# Patient Record
Sex: Male | Born: 1955 | Race: White | Hispanic: No | Smoking: Former smoker
Health system: Southern US, Community
[De-identification: ages and names within clinical notes are randomized; demographics above are authoritative.]

## PROBLEM LIST (undated history)

## (undated) DIAGNOSIS — I2510 Atherosclerotic heart disease of native coronary artery without angina pectoris: Secondary | ICD-10-CM

## (undated) DIAGNOSIS — I420 Dilated cardiomyopathy: Secondary | ICD-10-CM

## (undated) DIAGNOSIS — Z8739 Personal history of other diseases of the musculoskeletal system and connective tissue: Secondary | ICD-10-CM

## (undated) DIAGNOSIS — Z87891 Personal history of nicotine dependence: Secondary | ICD-10-CM

## (undated) DIAGNOSIS — L719 Rosacea, unspecified: Secondary | ICD-10-CM

## (undated) DIAGNOSIS — I639 Cerebral infarction, unspecified: Secondary | ICD-10-CM

## (undated) DIAGNOSIS — I10 Essential (primary) hypertension: Secondary | ICD-10-CM

## (undated) DIAGNOSIS — L930 Discoid lupus erythematosus: Secondary | ICD-10-CM

## (undated) DIAGNOSIS — K760 Fatty (change of) liver, not elsewhere classified: Secondary | ICD-10-CM

## (undated) DIAGNOSIS — E119 Type 2 diabetes mellitus without complications: Secondary | ICD-10-CM

## (undated) DIAGNOSIS — E785 Hyperlipidemia, unspecified: Secondary | ICD-10-CM

## (undated) DIAGNOSIS — M109 Gout, unspecified: Secondary | ICD-10-CM

## (undated) HISTORY — DX: Hyperlipidemia, unspecified: E78.5

## (undated) HISTORY — DX: Gout, unspecified: M10.9

## (undated) HISTORY — DX: Personal history of nicotine dependence: Z87.891

## (undated) HISTORY — DX: Dilated cardiomyopathy: I42.0

## (undated) HISTORY — DX: Type 2 diabetes mellitus without complications: E11.9

## (undated) HISTORY — DX: Fatty (change of) liver, not elsewhere classified: K76.0

## (undated) HISTORY — DX: Rosacea, unspecified: L71.9

## (undated) HISTORY — DX: Cerebral infarction, unspecified: I63.9

## (undated) HISTORY — DX: Discoid lupus erythematosus: L93.0

## (undated) SURGERY — LEFT HEART CATH
Anesthesia: Choice

---

## 2001-07-25 DIAGNOSIS — M109 Gout, unspecified: Secondary | ICD-10-CM

## 2001-07-25 HISTORY — DX: Gout, unspecified: M10.9

## 2004-03-30 ENCOUNTER — Ambulatory Visit: Payer: Self-pay | Admitting: Family Medicine

## 2004-09-25 ENCOUNTER — Ambulatory Visit: Payer: Self-pay | Admitting: Family Medicine

## 2004-10-19 ENCOUNTER — Ambulatory Visit: Payer: Self-pay | Admitting: Internal Medicine

## 2006-01-23 ENCOUNTER — Ambulatory Visit: Payer: Self-pay | Admitting: Family Medicine

## 2007-06-03 ENCOUNTER — Ambulatory Visit: Payer: Self-pay | Admitting: Family Medicine

## 2007-06-03 DIAGNOSIS — L930 Discoid lupus erythematosus: Secondary | ICD-10-CM | POA: Insufficient documentation

## 2007-06-03 DIAGNOSIS — N453 Epididymo-orchitis: Secondary | ICD-10-CM | POA: Insufficient documentation

## 2007-06-03 DIAGNOSIS — M109 Gout, unspecified: Secondary | ICD-10-CM | POA: Insufficient documentation

## 2007-06-03 LAB — CONVERTED CEMR LAB
Basophils Relative: 0.1 % (ref 0.0–1.0)
Hemoglobin: 15.4 g/dL (ref 13.0–17.0)
Monocytes Relative: 5.8 % (ref 3.0–11.0)
Platelets: 113 10*3/uL — ABNORMAL LOW (ref 150–400)
RDW: 13.1 % (ref 11.5–14.6)
WBC: 9 10*3/uL (ref 4.5–10.5)

## 2007-06-04 LAB — CONVERTED CEMR LAB
Chlamydia, DNA Probe: NEGATIVE
GC Probe Amp, Genital: NEGATIVE

## 2007-06-24 ENCOUNTER — Ambulatory Visit: Payer: Self-pay | Admitting: Family Medicine

## 2007-07-16 ENCOUNTER — Ambulatory Visit: Payer: Self-pay | Admitting: Family Medicine

## 2007-07-16 DIAGNOSIS — F528 Other sexual dysfunction not due to a substance or known physiological condition: Secondary | ICD-10-CM | POA: Insufficient documentation

## 2007-07-31 DIAGNOSIS — IMO0002 Reserved for concepts with insufficient information to code with codable children: Secondary | ICD-10-CM | POA: Insufficient documentation

## 2007-08-06 ENCOUNTER — Encounter: Payer: Self-pay | Admitting: Family Medicine

## 2008-05-12 ENCOUNTER — Ambulatory Visit: Payer: Self-pay | Admitting: Family Medicine

## 2008-05-12 DIAGNOSIS — R5381 Other malaise: Secondary | ICD-10-CM | POA: Insufficient documentation

## 2008-05-12 DIAGNOSIS — R5383 Other fatigue: Secondary | ICD-10-CM

## 2008-05-16 LAB — CONVERTED CEMR LAB
Albumin: 4.6 g/dL (ref 3.5–5.2)
Alkaline Phosphatase: 60 units/L (ref 39–117)
BUN: 17 mg/dL (ref 6–23)
Cholesterol: 167 mg/dL (ref 0–200)
Eosinophils Absolute: 0.4 10*3/uL (ref 0.0–0.7)
Eosinophils Relative: 5 % (ref 0.0–5.0)
GFR calc Af Amer: 90 mL/min
GFR calc non Af Amer: 74 mL/min
HCT: 45.5 % (ref 39.0–52.0)
MCHC: 34.5 g/dL (ref 30.0–36.0)
MCV: 89.5 fL (ref 78.0–100.0)
Monocytes Absolute: 0.6 10*3/uL (ref 0.1–1.0)
PSA: 0.67 ng/mL (ref 0.10–4.00)
Platelets: 130 10*3/uL — ABNORMAL LOW (ref 150–400)
Potassium: 3.9 meq/L (ref 3.5–5.1)
RDW: 12.9 % (ref 11.5–14.6)
Sodium: 140 meq/L (ref 135–145)
Testosterone: 507.69 ng/dL (ref 350.00–890)
Triglycerides: 244 mg/dL (ref 0–149)

## 2008-06-29 ENCOUNTER — Encounter: Payer: Self-pay | Admitting: Family Medicine

## 2008-08-16 HISTORY — PX: OTHER SURGICAL HISTORY: SHX169

## 2008-09-06 ENCOUNTER — Ambulatory Visit: Payer: Self-pay | Admitting: Family Medicine

## 2008-09-06 DIAGNOSIS — B009 Herpesviral infection, unspecified: Secondary | ICD-10-CM | POA: Insufficient documentation

## 2008-09-06 DIAGNOSIS — L2089 Other atopic dermatitis: Secondary | ICD-10-CM | POA: Insufficient documentation

## 2008-09-06 DIAGNOSIS — M25519 Pain in unspecified shoulder: Secondary | ICD-10-CM | POA: Insufficient documentation

## 2008-09-06 DIAGNOSIS — L259 Unspecified contact dermatitis, unspecified cause: Secondary | ICD-10-CM | POA: Insufficient documentation

## 2008-09-16 ENCOUNTER — Ambulatory Visit: Payer: Self-pay | Admitting: Family Medicine

## 2008-09-16 LAB — CONVERTED CEMR LAB: Rapid Strep: NEGATIVE

## 2010-01-01 ENCOUNTER — Encounter (INDEPENDENT_AMBULATORY_CARE_PROVIDER_SITE_OTHER): Payer: Self-pay | Admitting: *Deleted

## 2010-01-06 ENCOUNTER — Ambulatory Visit: Payer: Self-pay | Admitting: Family Medicine

## 2010-01-06 DIAGNOSIS — J029 Acute pharyngitis, unspecified: Secondary | ICD-10-CM | POA: Insufficient documentation

## 2010-05-29 ENCOUNTER — Emergency Department: Payer: Self-pay | Admitting: Unknown Physician Specialty

## 2010-06-27 NOTE — Letter (Signed)
Summary: Nadara Eaton letter  [place] [facility]  [address] [place], [place] [address]   Phone: [phone]  Fax: [phone]       01/01/2010 MRN: [id number]  WANDELL SCULLION [address] [place], [place]  [address]  Dear Mr. BURESH,  [facility] [place], and [facility] announce the retirement of Arta Silence, M.D., from full-time practice at the [facility] office effective November 23, 2009 and his plans of returning part-time.  It is important to Dr. Hetty Ely and to our practice that you understand that [facility] has seven physicians in our office for your health care needs.  We will continue to offer the same exceptional care that you have today.    Dr. Hetty Ely has spoken to many of you about his plans for retirement and returning part-time in the fall.   We will continue to work with you through the transition to schedule appointments for you in the office and meet the high standards that [facility] is committed to.   Again, it is with great pleasure that we share the news that Dr. Hetty Ely will return to [facility] in October of 2011 with a reduced schedule.    If you have any questions, or would like to request an appointment with one of our physicians, please call us at [phone] and press the option for Scheduling an appointment.  We take pleasure in providing you with excellent patient care and look forward to seeing you at your next office visit.  Our [facility] Physicians are:  Tillman Abide, M.D. Laurita Quint, M.D. Roxy Manns, M.D. Kerby Nora, M.D. Hannah Beat, M.D. Ruthe Mannan, M.D. We proudly welcomed Raechel Ache, M.D. and Eustaquio Boyden, M.D. to the practice in July/August 2011.  Sincerely,  [facility]

## 2010-06-27 NOTE — Assessment & Plan Note (Signed)
Summary: PINK EYE   Vital Signs:  Patient profile:   55 year old male Weight:      267 pounds Temp:     97.8 degrees F Pulse rate:   76 / minute BP sitting:   148 / 90  (left arm)  Vitals Entered By: Lamar Sprinkles, CMA (January 06, 2010 9:38 AM) CC: Sore throat x 1 mth/ dx strep positive at UC in [place]. Has been on abx x 4. Req refills of pain meds also.    History of Present Illness: 1 month ago had a very bad sore throat -- severe and very red , with swollen glands and fever and some white papules in his throat  felt tired and sick in general  went to urgent care and was dx with strep  rapid strep pos - instantly  treated with amoxicilln  then went back 1 week later -- no improvement and low grade fever  did throat cx and that was neg for strep or other infection  then treated with zithromax-- was better for 3 days and then came back then put him on augmentin -- has been on that with aleve (and very tired )  started to improve a bit yesterday  throat is sore but not nearly like it was  still has 2 days left of the abx   fevers are gone  no diffuse rash some bumps on fingers -he has had  ears are a little itchy  no allergies that he knows of  no nasal symptoms   no red or swollen joints no tick bites   airline lost his meds -- colchicine and indocin - for gout as needed/ does not use often   smoked in the past- no more   has been very stressed at work lately - not taking care of himself  is a bit shaky and nervous   in past has had a cold sore  has had sore throats in the past   Current Medications (verified): 1)  Endocet 5-325 Mg Tabs (Oxycodone-Acetaminophen) .[name].. 1 or 2 Tablets As Needed Pain 2)  Valtrex 1 Gm Tabs (Valacyclovir Hcl) .... 2 Tabs By Mouth Twice A Day For One Day. 3)  Vicodin 5-500 Mg Tabs (Hydrocodone-Acetaminophen) .... One Tab By Mouth Every 6 Hrs As Needed  Allergies (verified): 1)  ! * Solumedrol//cortisone  Review of  Systems General:  Complains of fatigue; denies loss of appetite, malaise, and sleep disorder. Eyes:  Denies blurring and eye irritation. ENT:  Complains of sore throat; denies ear discharge, earache, and nasal congestion. CV:  Denies chest pain or discomfort, lightheadness, and palpitations. Resp:  Denies cough and wheezing. GI:  Denies abdominal pain, change in bowel habits, and indigestion. MS:  Denies joint pain, joint redness, and joint swelling; no gout lately . Derm:  Denies itching, lesion(s), poor wound healing, and rash. Psych:  is very stressed. Heme:  Denies abnormal bruising, bleeding, and enlarge lymph nodes.  Physical Exam  General:  overweight but generally well appearing  Head:  normocephalic, atraumatic, and no abnormalities observed.  no snus tenderness Eyes:  vision grossly intact, pupils equal, pupils round, pupils reactive to light, and no injection.   Ears:  R ear normal and L ear normal.   Nose:  no nasal discharge.   Mouth:  very slt injection of throat posteriorly no exudate or swelling or lesions Neck:  No deformities, masses, or tenderness noted. Lungs:  Normal respiratory effort, chest expands symmetrically. Lungs are clear  to auscultation, no crackles or wheezes. Heart:  Normal rate and regular rhythm. S1 and S2 normal without gallop, murmur, click, rub or other extra sounds. Abdomen:  soft, non-tender, no hepatomegaly, and no splenomegaly.   Msk:  no acute joint changes  Extremities:  No clubbing, cyanosis, edema, or deformity noted with normal full range of motion of all joints.   Skin:  dry skin on hands and ears no rash Cervical Nodes:  No lymphadenopathy noted Psych:  normal affect, talkative and pleasant    Impression & Recommendations:  Problem # 1:  SORE THROAT (ICD-462) Assessment Deteriorated with prev dx strep now finally responding to augmentin will continue 5 more days of this if not further imp by mid week will call for ENT ref  His  updated medication list for this problem includes:    Augmentin 875-125 Mg Tabs (Amoxicillin-pot clavulanate) .[name]... 1 by mouth two times a day for 5 days    Indomethacin 50 Mg Caps (Indomethacin) .[name]... 1 by mouth up to three times a day as needed gout- take with food  Problem # 2:  GOUT (ICD-274.9) Assessment: Comment Only pt needs refils of gout medicine - incl indometh/ colcrys and also vidocin for as needed while traveling  does not refil these often His updated medication list for this problem includes:    Indomethacin 50 Mg Caps (Indomethacin) .[name]... 1 by mouth up to three times a day as needed gout- take with food    Colcrys 0.6 Mg Tabs (Colchicine) .[name]... 1 by mouth up to three times a day as needed gout with food  Complete Medication List: 1)  Valtrex 1 Gm Tabs (Valacyclovir hcl) .... 2 tabs by mouth twice a day for one day. 2)  Vicodin 5-500 Mg Tabs (Hydrocodone-acetaminophen) .... One tab by mouth every 6 hrs as needed 3)  Augmentin 875-125 Mg Tabs (Amoxicillin-pot clavulanate) .[name].. 1 by mouth two times a day for 5 days 4)  Indomethacin 50 Mg Caps (Indomethacin) .[name].. 1 by mouth up to three times a day as needed gout- take with food 5)  Colcrys 0.6 Mg Tabs (Colchicine) .[name].. 1 by mouth up to three times a day as needed gout with food  Patient Instructions: 1)  let us know mid next week if your sore throat is not better  2)  drink lots of water  3)  finish 5 more days of the augmentin 4)  here are refils for gout medicine as needed  Prescriptions: COLCRYS 0.6 MG TABS (COLCHICINE) 1 by mouth up to three times a day as needed gout with food  #20 x 0   Entered and Authorized by:   Judith Part MD   Signed by:   Judith Part MD on 01/06/2010   Method used:   Print then Give to Patient   RxID:   [id number] VICODIN 5-500 MG TABS (HYDROCODONE-ACETAMINOPHEN) one tab by mouth every 6 hrs as needed  #20 x 0   Entered and Authorized by:   Judith Part MD   Signed by:   Judith Part MD on 01/06/2010   Method used:   Print then Give to Patient   RxID:   [id number] INDOMETHACIN 50 MG CAPS (INDOMETHACIN) 1 by mouth up to three times a day as needed gout- take with food  #30 x 0   Entered and Authorized by:   Judith Part MD   Signed by:   Judith Part MD on 01/06/2010   Method  used:   Print then Give to Patient   RxID:   [id number] AUGMENTIN 875-125 MG TABS (AMOXICILLIN-POT CLAVULANATE) 1 by mouth two times a day for 5 days  #10 x 0   Entered and Authorized by:   Judith Part MD   Signed by:   Judith Part MD on 01/06/2010   Method used:   Print then Give to Patient   RxID:   [id number]

## 2010-10-12 NOTE — Assessment & Plan Note (Signed)
[facility]                                 ON-CALL NOTE   Colton [name], Colton [name]                      MRN:          [id number]  DATE:07/19/2006                            DOB:          1955-10-12    Phone number:  [phone].   [name] states that he has bronchitis, that he usually just gets  Biaxin and requests that he get a phone in prescription.  Apparently he  is going to [place] for the weekend and cannot come in to the office.   PLAN:  He was told that he would need to be seen before any antibiotics  would be given and apparently he was not too happy about that.  I gave  him the message and he was not pleased, but this is definitely our rule  and we explained that.     Karie Schwalbe, MD  Electronically Signed    RIL/MedQ  DD: 07/19/2006  DT: 07/19/2006  Job #: [id number]   cc:   Arta Silence, MD

## 2010-11-17 ENCOUNTER — Emergency Department (HOSPITAL_COMMUNITY): Payer: Self-pay

## 2010-11-17 ENCOUNTER — Inpatient Hospital Stay (HOSPITAL_COMMUNITY)
Admission: EM | Admit: 2010-11-17 | Discharge: 2010-11-18 | DRG: 202 | Disposition: A | Discharge status: Home / Self Care | Payer: Self-pay | Attending: Internal Medicine | Admitting: Internal Medicine

## 2010-11-17 DIAGNOSIS — I498 Other specified cardiac arrhythmias: Secondary | ICD-10-CM | POA: Diagnosis present

## 2010-11-17 DIAGNOSIS — R030 Elevated blood-pressure reading, without diagnosis of hypertension: Secondary | ICD-10-CM | POA: Diagnosis present

## 2010-11-17 DIAGNOSIS — F17200 Nicotine dependence, unspecified, uncomplicated: Secondary | ICD-10-CM | POA: Diagnosis present

## 2010-11-17 DIAGNOSIS — R599 Enlarged lymph nodes, unspecified: Secondary | ICD-10-CM | POA: Diagnosis present

## 2010-11-17 DIAGNOSIS — J209 Acute bronchitis, unspecified: Principal | ICD-10-CM | POA: Diagnosis present

## 2010-11-17 DIAGNOSIS — F411 Generalized anxiety disorder: Secondary | ICD-10-CM | POA: Diagnosis present

## 2010-11-17 DIAGNOSIS — J189 Pneumonia, unspecified organism: Secondary | ICD-10-CM | POA: Diagnosis present

## 2010-11-17 LAB — PROTIME-INR
INR: 1.04 (ref 0.00–1.49)
Prothrombin Time: 13.8 seconds (ref 11.6–15.2)

## 2010-11-17 LAB — DIFFERENTIAL
Eosinophils Absolute: 0.6 10*3/uL (ref 0.0–0.7)
Eosinophils Relative: 4 % (ref 0–5)
Lymphs Abs: 2.1 10*3/uL (ref 0.7–4.0)
Monocytes Absolute: 1 10*3/uL (ref 0.1–1.0)
Monocytes Relative: 7 % (ref 3–12)

## 2010-11-17 LAB — CBC
MCH: 31.1 pg (ref 26.0–34.0)
Platelets: 120 10*3/uL — ABNORMAL LOW (ref 150–400)
RBC: 5.05 MIL/uL (ref 4.22–5.81)
WBC: 14.2 10*3/uL — ABNORMAL HIGH (ref 4.0–10.5)

## 2010-11-17 LAB — COMPREHENSIVE METABOLIC PANEL
ALT: 12 U/L (ref 0–53)
AST: 16 U/L (ref 0–37)
CO2: 25 mEq/L (ref 19–32)
Chloride: 101 mEq/L (ref 96–112)
Creatinine, Ser: 1.13 mg/dL (ref 0.50–1.35)
GFR calc non Af Amer: >60 mL/min (ref >60)
Glucose, Bld: 108 mg/dL — ABNORMAL HIGH (ref 70–99)
Total Bilirubin: 0.3 mg/dL (ref 0.3–1.2)

## 2010-11-17 LAB — TROPONIN I: Troponin I: <0.3 ng/mL (ref <0.30)

## 2010-11-17 LAB — URINALYSIS, ROUTINE W REFLEX MICROSCOPIC
Glucose, UA: NEGATIVE mg/dL
Hgb urine dipstick: NEGATIVE
Ketones, ur: NEGATIVE mg/dL
Leukocytes, UA: NEGATIVE
Protein, ur: NEGATIVE mg/dL

## 2010-11-17 LAB — PRO B NATRIURETIC PEPTIDE: Pro B Natriuretic peptide (BNP): 157.7 pg/mL — ABNORMAL HIGH (ref 0–125)

## 2010-11-18 ENCOUNTER — Observation Stay (HOSPITAL_COMMUNITY): Payer: Self-pay

## 2010-11-18 LAB — HEPARIN LEVEL (UNFRACTIONATED): Heparin Unfractionated: <0.1 IU/mL — ABNORMAL LOW (ref 0.30–0.70)

## 2010-11-18 LAB — CARDIAC PANEL(CRET KIN+CKTOT+MB+TROPI)
CK, MB: 1.1 ng/mL (ref 0.3–4.0)
Relative Index: INVALID (ref 0.0–2.5)
Troponin I: <0.3 ng/mL (ref <0.30)

## 2010-11-18 LAB — TSH: TSH: 1.096 u[IU]/mL (ref 0.350–4.500)

## 2010-11-20 NOTE — Consult Note (Signed)
NAMETRACER, GUTRIDGE NO.:  [id number]  MEDICAL RECORD NO.:  [id number]  LOCATION:  2014                         FACILITY:  [facility]  PHYSICIAN:  Armanda Magic, M.D.     DATE OF BIRTH:  12-06-1955  DATE OF CONSULTATION:  11/18/2010 DATE OF DISCHARGE:                                CONSULTATION   PRIMARY CARDIOLOGIST:  Jesse Sans. Daleen Squibb, MD, [facility]  REFERRING PHYSICIAN:  Dr. Ignacia Palma.  CHIEF COMPLAINT:  Chest pain.  HISTORY OF PRESENT ILLNESS:  This is a 55 year old male with a history of stress test in the past who presents to the emergency room with complaints of chest pain.  He was in his usual state of health until Friday evening around 9 p.m. when he developed a dull pain in the midsternal region of his chest radiating into the epigastric and down his left arm.  He also had a burning that started in his throat and with the whole way down into the epigastrium.  This has been constant since Friday and has not stopped or abated at all in intensity.  The pain has increased with deep inspiration.  He has had a fever and subjective chills for several days with mild phlegm production and brown-colored sputum.  The patient also says that when he is up and moving the pain gets worse as well.  He denies any nausea.  In the emergency room, initially he is tachycardic with a blood pressure initially of 143/101. The pain is noted to be a 5/10 and has been that way for the past 24 hours with no improvement.  He denies any back pain.  PAST MEDICAL HISTORY:  Gout.  There is no history of hypertension, diabetes, or CAD.  ALLERGIES:  None.  MEDICATIONS:  Colchicine as needed.  SOCIAL HISTORY:  He smokes 2 packs of cigarettes daily for the past 35 years.  He occasionally drinks alcohol.  He is widowed.  He has no children.  FAMILY HISTORY:  His mother is alive and well.  His father died of old age and significant coronary artery disease.  He has 3 brothers and 1 sister  who are alive and well.  REVIEW OF SYSTEMS:  Otherwise as stated in the HPI is negative.  PHYSICAL EXAMINATION:  VITAL SIGNS:  Blood pressure is 159/99 mmHg, heart rate 118 beats per minute, T-max 99.1, and O2 saturations 95% on 2 liters. GENERAL:  A well-developed, well-nourished white male in no acute distress. HEENT:  Benign. NECK:  Supple without lymphadenopathy. LUNGS:  Clear to auscultation except for crackles at the left base. HEART:  Regular rate and rhythm.  No murmurs, rubs, or gallops. ABDOMEN:  Soft, nontender, and nondistended.  Normoactive bowel sounds. No hepatosplenomegaly. EXTREMITIES:  No cyanosis, erythema, or edema.  +2 dorsalis pedis pulses bilaterally.  LABORATORY DATA:  Sodium 137, potassium 4.1, chloride 101, bicarb 25, BUN 16, and creatinine 1.13.  BNP 157.  White cell count 14.2, hemoglobin 15.7, hematocrit 44.8, and platelet count 120.  Troponin less than 0.3.  CPK 40, MB 1.2.  EKG shows sinus rhythm to sinus tachycardia with incomplete right bundle-branch block.  Chest x-ray shows left bibasilar atelectasis.  ASSESSMENT: 1. Atypical  chest pain, both pleuritic and burning, constant x24 hours     with normal cardiac enzymes.  He has subjective fever and chills     and has a low-grade fever tonight with cough productive of brown     sputum, most consistent with acute bronchitis with elevated white     blood cell count.  EKG is nonischemic, but just J-point elevation     in V2.  He also has an incomplete right bundle-branch block. 2. Elevated blood pressure, questionably secondary to pain and     underlying anxiety.  He has been under a lot of stress at work. 3. Shortness of breath with fever, chills, and cough and elevated     white blood cell count consistent with upper respiratory infection. 4. Sinus tachycardia, questionably secondary to fever and underlying     lung infection.  PLAN:  Admit to the Telemetry per Hospitalist.  We will cycle  cardiac enzymes.  Continue IV heparin drip until rule out is complete.  GI cocktail for burning pain.  Blood pressure management per Hospitalist. We will check a stat D-dimer.  Treatment of bronchitis per Hospitalist.     Armanda Magic, M.D.     TT/MEDQ  D:  11/18/2010  T:  11/18/2010  Job:  [id number]  cc:   Thomas C. Daleen Squibb, MD, [facility]  Electronically Signed by Armanda Magic M.D. on 11/20/2010 10:03:43 PM

## 2010-12-11 NOTE — Discharge Summary (Signed)
  NAME:  Colton [name], Colton [name] NO.:  [id number]  MEDICAL RECORD NO.:  [id number]  LOCATION:  2014                         FACILITY:  [facility]  PHYSICIAN:  Zannie Cove, MD     DATE OF BIRTH:  1955/09/30  DATE OF ADMISSION:  11/17/2010 DATE OF DISCHARGE:                              DISCHARGE SUMMARY   PRIMARY CARE PHYSICIAN:  Arta Silence, MD  CARDIOLOGIST:  Jesse Sans. Wall, MD, [facility]  DISCHARGE DIAGNOSES: 1. Bronchitis. 2. Questionable early pneumonia. 3. Reactive borderline enlarged mediastinal lymphadenopathy. 4. Tobacco use. 5. Pleuritic chest pain, improved. 6. History of gout. 7. Obesity. 8. CT findings suspicious for emphysema, needs PFTs to confirm COPD.  DISCHARGE MEDICATIONS: 1. Moxifloxacin 400 mg p.o. daily for nine more days. 2. Aspirin 81 mg daily. 3. Albuterol MDI 1-2 puffs q.4 h. p.r.n. 4. Tylenol 650 mg q.6 h. p.r.n.  CONSULTANTS: 1. Armanda Magic, M.D. covering for [facility] 2. Vesta Mixer, M.D., [facility].  DIAGNOSTICS/INVESTIGATIONS:  Chest x-ray November 17, 2010 low lung volumes, bibasilar atelectasis.  CT angio of the chest November 18, 2010, no evidence of acute PE, emphysema with dependent collapse or consolidation, borderline enlarged lymph nodes in the mediastinum could be reactive, but neoplastic involvement could not be excluded.  Consider followup CT in 3 months to assess debility.  HOSPITAL COURSE:  [name] is a 55 year old gentleman with history of tobacco use for many years presented to the hospital with pleuritic chest pain, fevers, and chills.  On evaluation, he was felt to have bronchitis versus early pneumonia as well as admitted to the hospital to rule out PE as well as MI.  His EKG was found to be unremarkable.  His cardiac markers were negative, seen by Cardiology in consultation as well who agreed that it was our respiratory pulmonic process.  He was treated with Rocephin, Zithromax as well  as inhalers with moderate clinical response.  However, his D-dimers was found to be a mildly elevated on admission, subsequently underwent a CT angiogram with results dictated above.  The patient has been adamant to leave and hence will be discharged home in stable condition today to complete his course of antibiotics, use inhalers.  He does have history of reactions to prednisone as well as different derivatives of steroids, hence did not receive this in the hospital.  He will need PFTs as outpatient as well as a follow-up CT of his chest to make sure his mediastinal lymph nodes are stable or improving.  DISCHARGE CONDITION:  Stable.  VITAL SIGNS:  Temperature is 98.1, pulse 99, blood pressure 154/95, respirations 20, satting 92% on room air.  DISCHARGE FOLLOWUP:  Arta Silence, MD in 1 week.     Zannie Cove, MD     PJ/MEDQ  D:  11/18/2010  T:  11/18/2010  Job:  [id number]  cc:   Arta Silence, MD Jesse Sans. Daleen Squibb, MD, [facility]  Electronically Signed by Zannie Cove  on 12/11/2010 08:14:05 PM

## 2010-12-17 NOTE — H&P (Signed)
NAME:  Colton [name], SCHREUR NO.:  [id number]  MEDICAL RECORD NO.:  [id number]  LOCATION:                                 FACILITY:  PHYSICIAN:  Della Goo, M.D. DATE OF BIRTH:  Jul 21, 1955  DATE OF ADMISSION:  11/18/2010 DATE OF DISCHARGE:                             HISTORY & PHYSICAL   PRIMARY CARE PHYSICIAN AND CARDIOLOGIST:  Maisie Fus C. Wall, MD, [facility].  CHIEF COMPLAINT:  Chest pain, fever.  HISTORY OF PRESENT ILLNESS:  This is a 55 year old male, who presents to the emergency department with complaints of constant substernal area chest pain for the past one and a half days.  He reports having fever and chills and also states the pain is worse with deep breaths.  He denies having any cough.  He does report having some mild soreness of the throat.  The patient was seen in the emergency department, was evaluated by cardiology Dr. Eliott Nine.  He also had an ER workup in which his EKG had normal findings.  Chest x-ray did reveal bibasilar atelectasis.  The patient's symptoms were not relieved with nitroglycerin therapy that have been administered.  The patient was started on empiric therapy for a possible tracheal bronchitis versus early pneumonia.  PAST MEDICAL HISTORY:  Significant for gout.  PAST SURGICAL HISTORY:  None.  MEDICATIONS:  None.  ALLERGIES:  PREDNISONE.  SOCIAL HISTORY:  The patient is a trader.  He is a smoker.  He smokes two pack of cigarettes daily.  He denies any alcohol usage and has no history of illicit drug usage.  FAMILY HISTORY:  Positive for coronary artery disease in his father.  No history of hypertension, diabetes, or cancer in his family.  REVIEW OF SYSTEMS:  Pertinent as mentioned above.  PHYSICAL EXAMINATION FINDINGS:  GENERAL:  This is a 55 year old, morbidly obese, Caucasian male, who is in discomfort but no acute distress. VITAL SIGNS:  Temperature 99.5, blood pressure initially 182/112, now 143/88, heart  rate 126, now 114, and respirations 20-22, O2 sats 97%- 98%. HEENT:  Normocephalic, atraumatic.  Pupils are equally round, reactive to light.  Extraocular movements are intact.  Funduscopic benign.  There is no scleral icterus.  Nares are patent bilaterally.  Oropharynx is clear. NECK:  Supple.  Full range of motion.  No thyromegaly, adenopathy, or jugular venous distention. CARDIOVASCULAR:  Regular rate and rhythm.  Normal S1-S2, mild tachycardia.  No murmurs, gallops, or rubs appreciated. LUNGS:  Clear to auscultation bilaterally.  No rales, rhonchi, or wheezes appreciated.  Chest wall nontender.  No unusual masses, symmetric excursion.  Breathing is unlabored. ABDOMEN:  Positive bowel sounds, soft, nontender, nondistended.  No hepatosplenomegaly.  Abdomen is obese. EXTREMITIES:  Without cyanosis, clubbing, or edema. NEUROLOGIC:  The patient is alert and oriented x3.  Cranial nerves are intact.  Motor and sensory are intact as well. MUSCULOSKELETAL:  Full range of motion, 5/5 strength throughout.  LABORATORY STUDIES:  White blood cell count 14.2, hemoglobin 15.7, hematocrit 44.8, MCV 88.7, platelets 120, neutrophils 74%, lymphocytes 15%.  Protime 13.8, INR 1.04, PTT 30.  Beta-natriuretic peptide 107.7. Chemistry with a sodium of 137, potassium 4.1, chloride 101, carbon dioxide 25, BUN 16, creatinine  1.13, glucose 108, albumin 3.9. Urinalysis negative.  EKG will be located.  ASSESSMENT:  A 55 year old male being admitted with 1. Pleuritic chest pain. 2. Early pneumonia versus tracheobronchitis. 3. Elevated blood pressure with no previous history of hypertension. 4. Tachycardia. 5. Anxiety. 6. History of gout. 7. Morbid obesity.  PLAN:  The patient will be admitted for 23-hour observation, initially cardiac enzymes will be performed.  The patient will be placed on Nitro paste, oxygen, and aspirin therapies.  The patient will also be placed on antibiotic therapy of Rocephin and  azithromycin at this time for empiric coverage of a possible early pneumonia, which would be a community-acquired pneumonia.  Albuterol nebulizer treatments have been ordered p.r.n.  Gentle IV fluids have also been ordered.  The patient will be placed on DVT prophylaxis and a D-dimer has been ordered as well secondary to the patient's pleuritic nature of pain.  However, this is thought to be more an infectious process secondary to the elevated white count and his overall symptoms.  Further workup will ensue pending results, the patient's clinical course, and studies.     Della Goo, M.D.     HJ/MEDQ  D:  11/18/2010  T:  11/18/2010  Job:  [id number]  Electronically Signed by Della Goo M.D. on 12/17/2010 11:16:19 AM

## 2013-07-19 ENCOUNTER — Encounter: Payer: Self-pay | Admitting: Family Medicine

## 2013-07-19 ENCOUNTER — Ambulatory Visit (INDEPENDENT_AMBULATORY_CARE_PROVIDER_SITE_OTHER): Payer: Self-pay | Admitting: Family Medicine

## 2013-07-19 VITALS — BP 142/102 | HR 94 | Temp 98.1°F | Wt 264.5 lb

## 2013-07-19 DIAGNOSIS — K5900 Constipation, unspecified: Secondary | ICD-10-CM | POA: Insufficient documentation

## 2013-07-19 NOTE — Progress Notes (Signed)
Pre visit review using our clinic review tool, if applicable. No additional management support is needed unless otherwise documented below in the visit note.  Stool frequency changes.  No blood in stool.  Normal consistency of BM, just irregular and inconsistent.  Limited fiber in diet, max 1 salad once a day- he had stopped that recently.  No fevers.  No vomiting. No unintended weight loss.  Had been on adkin's to lose some weight.  Sleep cycle is disrupted.  Meals can be variable, may skip breakfast.   2 colonics done last week.  Still feels bloated.  Occ LLQ abd pain.  Saw chiropractor re: back pain, improved now.    Meds, vitals, and allergies reviewed.   ROS: See HPI.  Otherwise, noncontributory.  GEN: nad, alert and oriented HEENT: mucous membranes moist NECK: supple w/o LA CV: rrr.  no murmur PULM: ctab, no inc wob ABD: soft, +bs, no ttp, no rebound EXT: no edema SKIN: no acute rash

## 2013-07-19 NOTE — Assessment & Plan Note (Signed)
Corresponds to dec in fiber/salad intake.  Would inc fiber in meantime and then add on miralax if needed.  No alarming sx.  F/u prn.  He agrees.

## 2013-07-19 NOTE — Patient Instructions (Signed)
Try getting more fiber- at least 1 salad a day.  If needed, then try miralax once a day.  Take care. Glad to see you.

## 2013-07-23 ENCOUNTER — Other Ambulatory Visit: Payer: Self-pay | Admitting: Family Medicine

## 2013-07-23 DIAGNOSIS — M109 Gout, unspecified: Secondary | ICD-10-CM

## 2013-07-23 DIAGNOSIS — R030 Elevated blood-pressure reading, without diagnosis of hypertension: Secondary | ICD-10-CM

## 2013-07-30 ENCOUNTER — Other Ambulatory Visit (INDEPENDENT_AMBULATORY_CARE_PROVIDER_SITE_OTHER): Payer: Self-pay

## 2013-07-30 DIAGNOSIS — R030 Elevated blood-pressure reading, without diagnosis of hypertension: Secondary | ICD-10-CM

## 2013-07-30 DIAGNOSIS — M109 Gout, unspecified: Secondary | ICD-10-CM

## 2013-07-30 LAB — LIPID PANEL
CHOLESTEROL: 168 mg/dL (ref 0–200)
HDL: 28.8 mg/dL — ABNORMAL LOW (ref >39.00)
LDL Cholesterol: 90 mg/dL (ref 0–99)
Total CHOL/HDL Ratio: 6
Triglycerides: 248 mg/dL — ABNORMAL HIGH (ref 0.0–149.0)
VLDL: 49.6 mg/dL — AB (ref 0.0–40.0)

## 2013-07-30 LAB — COMPREHENSIVE METABOLIC PANEL
ALBUMIN: 4.4 g/dL (ref 3.5–5.2)
ALT: 19 U/L (ref 0–53)
AST: 17 U/L (ref 0–37)
Alkaline Phosphatase: 59 U/L (ref 39–117)
BILIRUBIN TOTAL: 0.6 mg/dL (ref 0.3–1.2)
BUN: 16 mg/dL (ref 6–23)
CALCIUM: 9.3 mg/dL (ref 8.4–10.5)
CHLORIDE: 105 meq/L (ref 96–112)
CO2: 26 meq/L (ref 19–32)
Creatinine, Ser: 1.3 mg/dL (ref 0.4–1.5)
GFR: 62.99 mL/min (ref >60.00)
GLUCOSE: 91 mg/dL (ref 70–99)
POTASSIUM: 4.4 meq/L (ref 3.5–5.1)
SODIUM: 139 meq/L (ref 135–145)
TOTAL PROTEIN: 7.2 g/dL (ref 6.0–8.3)

## 2013-07-30 LAB — URIC ACID: URIC ACID, SERUM: 7.9 mg/dL — AB (ref 4.0–7.8)

## 2013-08-04 ENCOUNTER — Ambulatory Visit: Payer: Self-pay | Admitting: Family Medicine

## 2013-08-04 DIAGNOSIS — Z0289 Encounter for other administrative examinations: Secondary | ICD-10-CM

## 2013-08-11 ENCOUNTER — Ambulatory Visit (INDEPENDENT_AMBULATORY_CARE_PROVIDER_SITE_OTHER): Payer: Self-pay | Admitting: Family Medicine

## 2013-08-11 ENCOUNTER — Encounter: Payer: Self-pay | Admitting: Family Medicine

## 2013-08-11 VITALS — BP 140/98 | HR 86 | Temp 98.4°F | Wt 263.2 lb

## 2013-08-11 DIAGNOSIS — Z8249 Family history of ischemic heart disease and other diseases of the circulatory system: Secondary | ICD-10-CM | POA: Insufficient documentation

## 2013-08-11 DIAGNOSIS — M109 Gout, unspecified: Secondary | ICD-10-CM

## 2013-08-11 DIAGNOSIS — Z1211 Encounter for screening for malignant neoplasm of colon: Secondary | ICD-10-CM

## 2013-08-11 DIAGNOSIS — Z23 Encounter for immunization: Secondary | ICD-10-CM

## 2013-08-11 DIAGNOSIS — Z7184 Encounter for health counseling related to travel: Secondary | ICD-10-CM | POA: Insufficient documentation

## 2013-08-11 DIAGNOSIS — Z0000 Encounter for general adult medical examination without abnormal findings: Secondary | ICD-10-CM

## 2013-08-11 MED ORDER — TYPHOID VACCINE PO CPDR: 1.0000 | DELAYED_RELEASE_CAPSULE | ORAL | Status: DC

## 2013-08-11 NOTE — Assessment & Plan Note (Signed)
Routine anticipatory guidance given to patient.  See health maintenance. Tetanus 2002- due for repeat.   Flu shot encouraged.  Shingle and PNA shots can be done in his 60s.   Prostate cancer screening and PSA options (with potential risks and benefits of testing vs not testing) were discussed along with recent recs/guidelines.  He declined testing PSA at this point. D/w patient RU:[id number]:options for colon cancer screening, including IFOB vs. colonoscopy.  Risks and benefits of both were discussed and patient voiced understanding.  Pt elects for: IFOB.  Living will d/w pt.  He is getting one done.  Would have Marikay AlarSandy Smith [phone]) designated he were incapacitated.  Diet and exercise d/w pt.  Better with diet than exercise.  He has plans to improve.  He is still working on diet, has lost 30lbs in last year intentionally.   Labs d/w pt.

## 2013-08-11 NOTE — Assessment & Plan Note (Signed)
Check HAV and HBV titers. He'll check his other meds at home and notify me.  See notes on labs.  Would vaccinate for typhoid and he got tdap today.

## 2013-08-11 NOTE — Progress Notes (Signed)
Pre visit review using our clinic review tool, if applicable. No additional management support is needed unless otherwise documented below in the visit note.  CPE- See plan.  Routine anticipatory guidance given to patient.  See health maintenance. Tetanus 2002- due for repeat.   Flu shot encouraged.  Shingle and PNA shots can be done in his 60s.   Prostate cancer screening and PSA options (with potential risks and benefits of testing vs not testing) were discussed along with recent recs/guidelines.  He declined testing PSA at this point. D/w patient HY:QMVHQIOre:options for colon cancer screening, including IFOB vs. colonoscopy.  Risks and benefits of both were discussed and patient voiced understanding.  Pt elects for: IFOB.  Living will d/w pt.  He is getting one done.  Would have Marikay AlarSandy Smith [phone]) designated he were incapacitated.  Diet and exercise d/w pt.  Better with diet than exercise.  He has plans to improve.  He is still working on diet, has lost 30lbs in last year intentionally.   Labs d/w pt.   [place] soon.  Checked CDC site re: recs for travel, d/w pt.   He has a FH of CAD and wanted to reest with cards.  Prev saw Dr. Daleen SquibbWall.  No CP, SOB.   PMH and SH reviewed  Meds, vitals, and allergies reviewed.   ROS: See HPI.  Otherwise negative.    GEN: nad, alert and oriented HEENT: mucous membranes moist NECK: supple w/o LA CV: rrr. PULM: ctab, no inc wob ABD: soft, +bs EXT: no edema SKIN: no acute rash

## 2013-08-11 NOTE — Assessment & Plan Note (Signed)
Rare flares.

## 2013-08-11 NOTE — Patient Instructions (Addendum)
Let me know about your other meds.  Send a my chart message.  Take the typhoid vaccine every other day.  Don't take it with antibiotics.  Go to the lab on the way out.  We'll contact you with your lab report (hepatitis vaccine tests and stool cards). Glad to see you.  Take care.

## 2013-08-11 NOTE — Assessment & Plan Note (Signed)
He wanted to reest with cards. Prev saw Dr. Daleen SquibbWall. No CP, SOB.

## 2013-08-12 LAB — HEPATITIS B SURFACE ANTIBODY,QUALITATIVE: HEP B S AB: POSITIVE — AB

## 2013-08-12 LAB — HEPATITIS A ANTIBODY, TOTAL: HEP A TOTAL AB: REACTIVE — AB

## 2013-08-17 ENCOUNTER — Telehealth: Payer: Self-pay

## 2013-08-17 NOTE — Telephone Encounter (Signed)
I need more details on this.  You can't run a stool O&P w/o having seen any parasites in stool.  Has he actually seen anything in his stools? I need to get his initial stool kit back in the meantime.   Additionally, if the fish were cooked (and given the timeline), the likelihood of that being the trigger is low.

## 2013-08-17 NOTE — Telephone Encounter (Signed)
Noted, I'll await the stool IFOB.

## 2013-08-17 NOTE — Telephone Encounter (Signed)
Pt has not seen parasites in the stool; pt's fish was not cooked.Patient notified as instructed by telephone and pt understands Dr Damita Dunnings wants to get stool kit results that was brought today.

## 2013-08-17 NOTE — Telephone Encounter (Signed)
Left v/m for pt to cb. 

## 2013-08-17 NOTE — Telephone Encounter (Signed)
2 or so months ago Pt and his girlfriend ate fish at [place] in [place]. 1-1 1/2 weeks later both began with bloating, lower abd cramping,constipation, very difficult to have BM.No fever. Pt had colonic treatment(warm water enema?) in [place]. No real relief; pt believes since he and his girlfriend have identical symptoms pt wants parasite kit done. Pt will be at [facility] today around 3 - 3:15 pm and would like to pick up parasite kits.Please advise.

## 2013-08-17 NOTE — Telephone Encounter (Signed)
Pt came by office and I apologized that parasite kits are not ready for pickup; advised pt an order is needed to give the pt the kit. Advised pt he will receive call back after Dr Damita Dunnings replies to note. Pt voiced understanding but would like call back today, no later than tomorrow.

## 2013-08-18 ENCOUNTER — Other Ambulatory Visit (INDEPENDENT_AMBULATORY_CARE_PROVIDER_SITE_OTHER): Payer: Self-pay

## 2013-08-18 DIAGNOSIS — Z1211 Encounter for screening for malignant neoplasm of colon: Secondary | ICD-10-CM

## 2013-08-18 LAB — FECAL OCCULT BLOOD, IMMUNOCHEMICAL: FECAL OCCULT BLD: NEGATIVE

## 2013-09-27 ENCOUNTER — Ambulatory Visit: Payer: Self-pay | Admitting: Cardiology

## 2013-11-23 ENCOUNTER — Ambulatory Visit: Payer: Self-pay | Admitting: Cardiology

## 2013-12-17 ENCOUNTER — Telehealth: Payer: Self-pay | Admitting: Family Medicine

## 2013-12-17 NOTE — Telephone Encounter (Signed)
Caller: Chuck/Patient; Patient Name: Colton [name], Colton [name]; PCP: Crawford Givensuncan, Graham Clelia Croft(Shaw) [facility](Family Practice); Best Callback Phone Number: [phone] Patient says he has had a cough for a week then feeling that  he has something in his lung area  for the last 3 day, started Tuesday 7/21.[name].  Cough with clear mucus.  Pressure around sinuses at intervals. Some chest discomfort that he describes again as something in his lungs.  Thinks he has had a fever since Tuesday 7/21 but has not taken temp.  Triaged in Cough Adult Guideline - See Provider within 24 hours due to pressure/pain above below eyes over sinuses.  Patient requesting to be seen today 7/24, wanting someone to listen to his heart and lungs.   Appointments not available, would like to be worked in yet today.   Please review and contact patient at  [phone].

## 2013-12-17 NOTE — Telephone Encounter (Signed)
Pt called to ck on status of appt; advised pt note would be sent to provider and his assistant will cb. If condition changes or worsens prior to cb pt will call office. Pt feels like he may have pneumonia.

## 2013-12-17 NOTE — Telephone Encounter (Signed)
Patient advised. Offered UC or Sat Clinic and patient asked for appt on Monday.  Appointment scheduled.

## 2013-12-17 NOTE — Telephone Encounter (Signed)
I could have possibly worked him in at lunch but not this afternoon.  UC or sat clinic.  Thanks.

## 2013-12-20 ENCOUNTER — Encounter: Payer: Self-pay | Admitting: Family Medicine

## 2013-12-20 ENCOUNTER — Ambulatory Visit (INDEPENDENT_AMBULATORY_CARE_PROVIDER_SITE_OTHER): Payer: Self-pay | Admitting: Family Medicine

## 2013-12-20 VITALS — BP 144/104 | HR 84 | Temp 98.0°F | Wt 268.5 lb

## 2013-12-20 DIAGNOSIS — K219 Gastro-esophageal reflux disease without esophagitis: Secondary | ICD-10-CM

## 2013-12-20 MED ORDER — OMEPRAZOLE 20 MG PO CPDR: 20.0000 mg | DELAYED_RELEASE_CAPSULE | Freq: Every day | ORAL | Status: DC

## 2013-12-20 MED ORDER — PROPRANOLOL HCL 10 MG PO TABS: 10.0000 mg | ORAL_TABLET | Freq: Every day | ORAL | Status: DC | PRN

## 2013-12-20 NOTE — Progress Notes (Signed)
Pre visit review using our clinic review tool, if applicable. No additional management support is needed unless otherwise documented below in the visit note.  Burning in his throat and at the sternum.  "It may just be stress."  He took one of his mother's losartan 25mg  tabs and that seemed to help.  No fevers.  No ear pain.  No rhinorrhea. Not a ST like a cold. He did have some chest congestion, resolved now, was clear. No blood in sputum.  No abd pain.  No diarrhea, no blood in stools.  No recent travel.    High stress at work, noted.  Irregular sleep and eating schedule due to work.    Meds, vitals, and allergies reviewed.   ROS: See HPI.  Otherwise, noncontributory.  nad ncat Tm wnl Nasal and OP exam wnl Neck supple, no LA rrr ctab abd soft not ttp Ext w/o edema

## 2013-12-20 NOTE — Patient Instructions (Signed)
Take prilosec 20mg  a day for now and see if that helps.  If you get anxious, then take propanolol.  That may help some. Take care.

## 2013-12-20 NOTE — Assessment & Plan Note (Signed)
Likely the cause of the chest sx, with very irregular work/meal/sleep schedule.  Start PPI for now. D/w pt.  He also feels anxious, again likely work related. Would use prn BB in the meantime. He agrees.  He'll call back as needed. Routine caution on BB given.  He is going to try to work on weight and diet.

## 2014-01-07 ENCOUNTER — Ambulatory Visit: Payer: Self-pay | Admitting: Cardiology

## 2014-03-17 ENCOUNTER — Ambulatory Visit: Payer: Self-pay | Admitting: Cardiology

## 2014-03-18 ENCOUNTER — Encounter: Payer: Self-pay | Admitting: Cardiology

## 2014-04-14 ENCOUNTER — Encounter: Payer: Self-pay | Admitting: Interventional Cardiology

## 2014-11-09 ENCOUNTER — Encounter: Payer: Self-pay | Admitting: Family Medicine

## 2014-11-09 ENCOUNTER — Ambulatory Visit (INDEPENDENT_AMBULATORY_CARE_PROVIDER_SITE_OTHER): Payer: Self-pay | Admitting: Family Medicine

## 2014-11-09 VITALS — BP 134/90 | HR 80 | Temp 98.7°F | Wt 264.8 lb

## 2014-11-09 DIAGNOSIS — R0789 Other chest pain: Secondary | ICD-10-CM

## 2014-11-09 DIAGNOSIS — I10 Essential (primary) hypertension: Secondary | ICD-10-CM

## 2014-11-09 MED ORDER — PROPRANOLOL HCL 10 MG PO TABS: 15.0000 mg | ORAL_TABLET | Freq: Two times a day (BID) | ORAL | Status: DC

## 2014-11-09 NOTE — Progress Notes (Signed)
Pre visit review using our clinic review tool, if applicable. No additional management support is needed unless otherwise documented below in the visit note.  Woke up early Monday AM.  B arm pain and chest pain.  Similar pain in L leg.  Went on all day.  Woke up with same sx on Tuesday, but went away with aspirin.  Went to [facility] clinic Tuesday night.  BP was 180/108 per patient report.  He started taking propranolol in the meantime.  Taking 10mg  tid.    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  He had an atypical drink (herbal) the day before his sx started.    He feels well now except for fatigue that is likely beta blocker related.   He can walk an unlimited distance and can do stairs w/o troubles.  He sx prev started at rest.    H/o neg stress test prev.  EKG reviewed from recent fast med clinic.  Compared to prev, no sig change.  No acute changes.   Meds, vitals, and allergies reviewed.   ROS: See HPI.  Otherwise, noncontributory.  GEN: nad, alert and oriented HEENT: mucous membranes moist NECK: supple w/o LA CV: rrr PULM: ctab, no inc wob ABD: soft, +bs EXT: no edema SKIN: no acute rash

## 2014-11-09 NOTE — Patient Instructions (Signed)
Change to 15mg  twice a day and update me in a few days.  I think the fatigue will gradually improve.  If recurrent chest pain, then go to the ER.  Take care.  Glad to see you.

## 2014-11-10 DIAGNOSIS — R0789 Other chest pain: Secondary | ICD-10-CM | POA: Insufficient documentation

## 2014-11-10 DIAGNOSIS — I10 Essential (primary) hypertension: Secondary | ICD-10-CM | POA: Insufficient documentation

## 2014-11-10 NOTE — Assessment & Plan Note (Signed)
Now improved, change BB to 15mg  bid.  D/w pt.  He agrees.  He'll update me in a few days. >25 minutes spent in face to face time with patient, >50% spent in counselling or coordination of care.

## 2014-11-10 NOTE — Assessment & Plan Note (Signed)
He feels well now except for fatigue that is likely beta blocker related.   He can walk an unlimited distance and can do stairs w/o troubles.  He sx prev started at rest.   H/o neg stress test prev.  EKG reviewed from recent fast med clinic.  Compared to prev, no sig change.  No acute changes.  At this point, continue BB for both HTN and for possible anxiety (which could be contributing) and he'll update me in a few days.   >25 minutes spent in face to face time with patient, >50% spent in counselling or coordination of care.

## 2015-03-09 ENCOUNTER — Other Ambulatory Visit: Payer: Self-pay | Admitting: Family Medicine

## 2015-04-27 ENCOUNTER — Other Ambulatory Visit: Payer: Self-pay | Admitting: Family Medicine

## 2015-05-03 ENCOUNTER — Other Ambulatory Visit: Payer: Self-pay | Admitting: Family Medicine

## 2015-05-16 ENCOUNTER — Telehealth: Payer: Self-pay

## 2015-05-16 NOTE — Telephone Encounter (Signed)
Pt left v/m requesting refill indomethacin and colcrys 0.6mg  # 60 (not on med list). Pt has had gout on and off for years that Dr Hetty ElySchaller treated; pt does not want to come in for appt since pt has long hx of gout. Pt established care and rare gout episodes discussed at 08/11/13 appt. Last acute visit 11/09/14. No future appt scheduled. Pt request med list when refilled.[facility] [address]

## 2015-05-17 MED ORDER — COLCHICINE 0.6 MG PO TABS: 0.6000 mg | ORAL_TABLET | Freq: Every day | ORAL | Status: DC | PRN

## 2015-05-17 MED ORDER — INDOMETHACIN 50 MG PO CAPS: 50.0000 mg | ORAL_CAPSULE | Freq: Two times a day (BID) | ORAL | Status: DC

## 2015-05-17 NOTE — Telephone Encounter (Signed)
Patient called and I let him know [facility]

## 2015-05-17 NOTE — Telephone Encounter (Signed)
Sent. Thanks.   

## 2015-06-21 ENCOUNTER — Other Ambulatory Visit: Payer: Self-pay | Admitting: Family Medicine

## 2015-07-21 ENCOUNTER — Other Ambulatory Visit: Payer: Self-pay | Admitting: Family Medicine

## 2015-07-21 NOTE — Telephone Encounter (Signed)
Electronic refill request. Last Filled:   #90 on 06/21/15 with 0 RF.  This is only one month's worth.  Does he need an OV?

## 2015-07-21 NOTE — Telephone Encounter (Signed)
Sent.  Schedule CPE when possible.  Thanks.  

## 2015-07-21 NOTE — Telephone Encounter (Signed)
Patient advised.   Will call to schedule CPE next week.

## 2015-08-04 ENCOUNTER — Other Ambulatory Visit: Payer: Self-pay | Admitting: *Deleted

## 2015-08-04 NOTE — Telephone Encounter (Signed)
Pt is requesting medication refill. Last f/u 10/2014 and last refill 04/2015

## 2015-08-06 MED ORDER — COLCHICINE 0.6 MG PO TABS: 0.6000 mg | ORAL_TABLET | Freq: Every day | ORAL | Status: DC | PRN

## 2015-08-06 NOTE — Telephone Encounter (Signed)
Sent. Thanks.  cpe when possible.   

## 2015-08-07 NOTE — Telephone Encounter (Signed)
Left detailed message on voicemail.  

## 2015-10-28 ENCOUNTER — Inpatient Hospital Stay (HOSPITAL_COMMUNITY)
Admission: AD | Admit: 2015-10-28 | Discharge: 2015-10-31 | DRG: 554 | Disposition: A | Discharge status: Home / Self Care | Payer: Self-pay | Source: Other Acute Inpatient Hospital | Attending: Neurology | Admitting: Neurology

## 2015-10-28 ENCOUNTER — Encounter: Payer: Self-pay | Admitting: *Deleted

## 2015-10-28 ENCOUNTER — Emergency Department
Admission: EM | Admit: 2015-10-28 | Discharge: 2015-10-28 | Disposition: A | Discharge status: Other Acute Inpatient Hospital | Payer: Self-pay | Attending: Emergency Medicine | Admitting: Emergency Medicine

## 2015-10-28 ENCOUNTER — Emergency Department: Payer: Self-pay

## 2015-10-28 DIAGNOSIS — Z9282 Status post administration of tPA (rtPA) in a different facility within the last 24 hours prior to admission to current facility: Secondary | ICD-10-CM

## 2015-10-28 DIAGNOSIS — R29810 Facial weakness: Secondary | ICD-10-CM | POA: Insufficient documentation

## 2015-10-28 DIAGNOSIS — D696 Thrombocytopenia, unspecified: Secondary | ICD-10-CM | POA: Diagnosis present

## 2015-10-28 DIAGNOSIS — Z8249 Family history of ischemic heart disease and other diseases of the circulatory system: Secondary | ICD-10-CM

## 2015-10-28 DIAGNOSIS — Z0000 Encounter for general adult medical examination without abnormal findings: Secondary | ICD-10-CM

## 2015-10-28 DIAGNOSIS — Z6839 Body mass index (BMI) 39.0-39.9, adult: Secondary | ICD-10-CM

## 2015-10-28 DIAGNOSIS — I10 Essential (primary) hypertension: Secondary | ICD-10-CM | POA: Insufficient documentation

## 2015-10-28 DIAGNOSIS — Z79899 Other long term (current) drug therapy: Secondary | ICD-10-CM

## 2015-10-28 DIAGNOSIS — R2990 Unspecified symptoms and signs involving the nervous system: Secondary | ICD-10-CM | POA: Diagnosis present

## 2015-10-28 DIAGNOSIS — E669 Obesity, unspecified: Secondary | ICD-10-CM | POA: Diagnosis present

## 2015-10-28 DIAGNOSIS — M109 Gout, unspecified: Principal | ICD-10-CM | POA: Diagnosis present

## 2015-10-28 DIAGNOSIS — I429 Cardiomyopathy, unspecified: Secondary | ICD-10-CM | POA: Diagnosis present

## 2015-10-28 DIAGNOSIS — Z888 Allergy status to other drugs, medicaments and biological substances status: Secondary | ICD-10-CM

## 2015-10-28 DIAGNOSIS — I639 Cerebral infarction, unspecified: Secondary | ICD-10-CM

## 2015-10-28 DIAGNOSIS — R29700 NIHSS score 0: Secondary | ICD-10-CM | POA: Diagnosis present

## 2015-10-28 DIAGNOSIS — I6339 Cerebral infarction due to thrombosis of other cerebral artery: Secondary | ICD-10-CM

## 2015-10-28 DIAGNOSIS — E785 Hyperlipidemia, unspecified: Secondary | ICD-10-CM | POA: Diagnosis present

## 2015-10-28 DIAGNOSIS — F17200 Nicotine dependence, unspecified, uncomplicated: Secondary | ICD-10-CM | POA: Diagnosis present

## 2015-10-28 DIAGNOSIS — Z87891 Personal history of nicotine dependence: Secondary | ICD-10-CM | POA: Insufficient documentation

## 2015-10-28 DIAGNOSIS — L930 Discoid lupus erythematosus: Secondary | ICD-10-CM | POA: Diagnosis present

## 2015-10-28 DIAGNOSIS — R471 Dysarthria and anarthria: Secondary | ICD-10-CM | POA: Insufficient documentation

## 2015-10-28 LAB — COMPREHENSIVE METABOLIC PANEL
ALT: 20 U/L (ref 17–63)
ANION GAP: 8 (ref 5–15)
AST: 23 U/L (ref 15–41)
Albumin: 4.3 g/dL (ref 3.5–5.0)
Alkaline Phosphatase: 61 U/L (ref 38–126)
BUN: 18 mg/dL (ref 6–20)
CHLORIDE: 106 mmol/L (ref 101–111)
CO2: 24 mmol/L (ref 22–32)
Calcium: 9.1 mg/dL (ref 8.9–10.3)
Creatinine, Ser: 1.32 mg/dL — ABNORMAL HIGH (ref 0.61–1.24)
GFR calc non Af Amer: 57 mL/min — ABNORMAL LOW (ref >60)
Glucose, Bld: 113 mg/dL — ABNORMAL HIGH (ref 65–99)
Potassium: 4.1 mmol/L (ref 3.5–5.1)
SODIUM: 138 mmol/L (ref 135–145)
Total Bilirubin: 0.7 mg/dL (ref 0.3–1.2)
Total Protein: 7.3 g/dL (ref 6.5–8.1)

## 2015-10-28 LAB — PROTIME-INR
INR: 1.13
PROTHROMBIN TIME: 14.7 s (ref 11.4–15.0)

## 2015-10-28 LAB — CBC
HCT: 43.1 % (ref 40.0–52.0)
Hemoglobin: 14.7 g/dL (ref 13.0–18.0)
MCH: 29.7 pg (ref 26.0–34.0)
MCHC: 34.2 g/dL (ref 32.0–36.0)
MCV: 86.9 fL (ref 80.0–100.0)
PLATELETS: 110 10*3/uL — AB (ref 150–440)
RBC: 4.96 MIL/uL (ref 4.40–5.90)
RDW: 14.1 % (ref 11.5–14.5)
WBC: 8.8 10*3/uL (ref 3.8–10.6)

## 2015-10-28 LAB — DIFFERENTIAL
Basophils Absolute: 0.1 10*3/uL (ref 0–0.1)
Eosinophils Absolute: 0.7 10*3/uL (ref 0–0.7)
Lymphocytes Relative: 22 %
Lymphs Abs: 1.9 10*3/uL (ref 1.0–3.6)
MONO ABS: 0.7 10*3/uL (ref 0.2–1.0)
Monocytes Relative: 8 %
NEUTROS ABS: 5.4 10*3/uL (ref 1.4–6.5)
Neutrophils Relative %: 61 %

## 2015-10-28 LAB — TROPONIN I: Troponin I: <0.03 ng/mL (ref <0.031)

## 2015-10-28 LAB — GLUCOSE, CAPILLARY: GLUCOSE-CAPILLARY: 107 mg/dL — AB (ref 65–99)

## 2015-10-28 LAB — APTT: aPTT: 33 seconds (ref 24–36)

## 2015-10-28 MED ORDER — ACETAMINOPHEN 650 MG RE SUPP: 650.0000 mg | RECTAL | Status: DC | PRN

## 2015-10-28 MED ORDER — ALTEPLASE (STROKE) FULL DOSE INFUSION
90.0000 mg | Freq: Once | INTRAVENOUS | Status: AC
  Administered 2015-10-28: 90 mg via INTRAVENOUS
  Filled 2015-10-28: qty 100

## 2015-10-28 MED ORDER — LABETALOL HCL 5 MG/ML IV SOLN
10.0000 mg | INTRAVENOUS | Status: DC | PRN
  Administered 2015-10-29: 10 mg via INTRAVENOUS
  Filled 2015-10-28: qty 4

## 2015-10-28 MED ORDER — STROKE: EARLY STAGES OF RECOVERY BOOK
Freq: Once | Status: AC
  Administered 2015-10-28: 22:00:00
  Filled 2015-10-28: qty 1

## 2015-10-28 MED ORDER — SODIUM CHLORIDE 0.9 % IV SOLN
INTRAVENOUS | Status: DC
  Administered 2015-10-28: 23:00:00 via INTRAVENOUS

## 2015-10-28 MED ORDER — NICOTINE 21 MG/24HR TD PT24
21.0000 mg | MEDICATED_PATCH | Freq: Every day | TRANSDERMAL | Status: DC
  Administered 2015-10-29 – 2015-10-31 (×4): 21 mg via TRANSDERMAL
  Filled 2015-10-28 (×4): qty 1

## 2015-10-28 MED ORDER — ACETAMINOPHEN 325 MG PO TABS: 650.0000 mg | ORAL_TABLET | ORAL | Status: DC | PRN

## 2015-10-28 NOTE — ED Notes (Signed)
EDP at bedside, pt states dizziness

## 2015-10-28 NOTE — ED Notes (Signed)
[facility]

## 2015-10-28 NOTE — ED Notes (Signed)
SOC set up in room. 

## 2015-10-28 NOTE — ED Provider Notes (Addendum)
[facility] Emergency Department Provider Note        Time seen: ----------------------------------------- 6:03 PM on 10/28/2015 -----------------------------------------    I have reviewed the triage vital signs and the nursing notes.   HISTORY  Chief Complaint No chief complaint on file.    HPI Colton Kimmel. is a 60 y.o. male who presents to ER with slurred speech. Colton [name] reports she was talking to him over the phone and the symptoms started around 4:00. Patient states he began having slurred speech and having weakness in his legs. He denies taking anything sedating. He denies any recent illness or changes in his medicines. He does take medicine for gout and has had high blood pressure.   Past Medical History  Diagnosis Date  . Gout 07/2001    [facility]  . Discoid lupus     Patient Active Problem List   Diagnosis Date Noted  . HTN (hypertension) 11/10/2014  . Atypical chest pain 11/10/2014  . GERD (gastroesophageal reflux disease) 12/20/2013  . Routine general medical examination at a health care facility 08/11/2013  . FH: CAD (coronary artery disease) 08/11/2013  . Counseling for travel 08/11/2013  . Unspecified constipation 07/19/2013  . HERPES LABIALIS 09/06/2008  . ERECTILE DYSFUNCTION 07/16/2007  . GOUT 06/03/2007  . 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 Cardiolite  08/16/2008    [facility]    Allergies Corticosteroids  Social History Social History  Substance Use Topics  . Smoking status: Former Smoker -- 2.00 packs/day for 30 years    Types: Cigarettes  . Smokeless tobacco: Never Used  . Alcohol Use: 0.0 oz/week    0 Standard drinks or equivalent per week     Comment: weekly   Review of Systems Constitutional: Negative for fever. Eyes: Negative for visual changes. ENT: Negative for sore throat. Cardiovascular: Negative for chest  pain. Respiratory: Negative for shortness of breath. Gastrointestinal: Negative for abdominal pain, vomiting and diarrhea. Genitourinary: Negative for dysuria. Musculoskeletal: Negative for back pain. Skin: Negative for rash. Neurological: Negative for headaches,Positive for weakness, slurred speech  10-point ROS otherwise negative.  ____________________________________________   PHYSICAL EXAM:  VITAL SIGNS: ED Triage Vitals  Enc Vitals Group     BP --      Pulse --      Resp --      Temp --      Temp src --      SpO2 --      Weight --      Height --      Head Cir --      Peak Flow --      Pain Score --      Pain Loc --      Pain Edu? --      Excl. in GC? --     Constitutional: Alert and oriented. Well appearing and in no distress. Eyes: Conjunctivae are normal. PERRL. Normal extraocular movements. ENT   Head: Normocephalic and atraumatic.   Nose: No congestion/rhinnorhea.   Mouth/Throat: Mucous membranes are moist.   Neck: No stridor. Cardiovascular: Normal rate, regular rhythm. No murmurs, rubs, or gallops. Respiratory: Normal respiratory effort without tachypnea nor retractions. Breath sounds are clear and equal bilaterally. No wheezes/rales/rhonchi. Gastrointestinal: Soft and nontender. Normal bowel sounds Musculoskeletal: Nontender with normal range of motion in all extremities. No lower extremity tenderness nor edema. Neurologic:  Slurred speech is noted, Possible right lower facial droop but this  resolves with smiling. Cranial nerves are otherwise intact, normal motor, normal strength and normal cerebellar functions appreciated. No sensory deficits are appreciated Skin:  Skin is warm, dry and intact. No rash noted. Psychiatric: Mood and affect are normal. ____________________________________________  EKG: Interpreted by me.Sinus rhythm with a rate of 83 bpm, normal PR interval, normal QRS, normal QT interval. Normal axis. Nonspecific T-wave  abnormality  ____________________________________________  ED COURSE:  Pertinent labs & imaging results that were available during my care of the patient were reviewed by me and considered in my medical decision making (see chart for details).  Patient is no acute distress, slurred speech onset is 2 hours ago. We will activate a code stroke and further evaluate. ____________________________________________    LABS (pertinent positives/negatives)  Labs Reviewed  CBC - Abnormal; Notable for the following:    Platelets 110 (*)    All other components within normal limits  COMPREHENSIVE METABOLIC PANEL - Abnormal; Notable for the following:    Glucose, Bld 113 (*)    Creatinine, Ser 1.32 (*)    GFR calc non Af Amer 57 (*)    All other components within normal limits  GLUCOSE, CAPILLARY - Abnormal; Notable for the following:    Glucose-Capillary 107 (*)    All other components within normal limits  PROTIME-INR  APTT  DIFFERENTIAL  TROPONIN I  URINE DRUG SCREEN, QUALITATIVE ([facility] ONLY)  CBG MONITORING, ED    RADIOLOGY Images were viewed by me CT head IMPRESSION: No evidence of acute intracranial abnormality.  Probable chronic small-vessel white matter ischemic changes.  Critical Value/emergent results were called by telephone at the time of interpretation on 10/28/2015 at 6:11 pm to Dr. Daryel NovemberJONATHAN Jizel Cheeks , who verbally acknowledged these results.  CRITICAL CARE Performed by: Emily FilbertWilliams, Makinna Andy E   Total critical care time: 30 minutes  Critical care time was exclusive of separately billable procedures and treating other patients.  Critical care was necessary to treat or prevent imminent or life-threatening deterioration.  Critical care was time spent personally by me on the following activities: development of treatment plan with patient and/or surrogate as well as nursing, discussions with consultants, evaluation of patient's response to treatment, examination of  patient, obtaining history from patient or surrogate, ordering and performing treatments and interventions, ordering and review of laboratory studies, ordering and review of radiographic studies, pulse oximetry and re-evaluation of patient's condition.  ____________________________________________  FINAL ASSESSMENT AND PLAN  Dysarthria, facial droop  Plan: Patient with labs and imaging as dictated above. Patient's been seen and evaluated by the neuro hospitalist who recommends TPA. Patient is requesting TPA as well and understands the risks and benefits. He is being given the maximum dose of TPA, his blood pressure currently qualifies for TPA infusion. I will discuss with the neuro hospitalist for [facility].   Emily FilbertWilliams, Jorgen Wolfinger E, MD  [facility]. I discussed with Dr. Roseanne RenoStewart use except the patient transfer and agrees with TPA at this time. Vital signs currently meet criteria for TPA infusion. Blood pressure 157/96. Note: This dictation was prepared with Dragon dictation. Any transcriptional errors that result from this process are unintentional   Emily FilbertJonathan E Maisey Deandrade, MD 10/28/15 1920  Emily FilbertJonathan E Juanmiguel Defelice, MD 10/28/15 [phone]

## 2015-10-28 NOTE — Consult Note (Signed)
Admission H&P    Chief Complaint: Slurred speech and weakness in lower extremities.  HPI: [place] [name]. is an 60 y.o. male with a history of hypertension, gout and discoid lupus, presented to the ED at [facility] following acute onset of slurred speech and weakness of lower extremities. Patient was last known well at 4:00 PM. He has no previous history of stroke nor TIA. CT scan of his head showed no acute intracranial abnormality. He was evaluated by telemetry neurology and deemed to be a candidate for IV TPA which was administered. Patient's deficits resolved subsequently. There were no apparent complications. He was transferred to [facility] for further management. NIH stroke score at the time of this evaluation was 0  LSN: 4:00 PM on 10/28/2015 tPA Given: Yes mRankin:  Past Medical History  Diagnosis Date  . Gout 07/2001    [facility]  . Discoid lupus     Past Surgical History  Procedure Laterality Date  . Cardiolite  08/16/2008    [facility]    Family History  Problem Relation Age of Onset  . Heart disease Father     CAD  . Heart disease Other     CAD  . Cancer Other     Lung  . Colon cancer Neg Hx   . Prostate cancer Neg Hx    Social History:  reports that he has quit smoking. His smoking use included Cigarettes. He has a 60 pack-year smoking history. He has never used smokeless tobacco. He reports that he drinks alcohol. He reports that he does not use illicit drugs.  Allergies:  Allergies  Allergen Reactions  . Corticosteroids     Muscle spasms    Medications Prior to Admission  Medication Sig Dispense Refill  . colchicine 0.6 MG tablet Take 1 tablet (0.6 mg total) by mouth daily as needed (gout). 30 tablet 0  . indomethacin (INDOCIN) 50 MG capsule Take 1 capsule (50 mg total) by mouth 2 (two) times daily with a meal. As needed for gout 60 capsule 0  . propranolol (INDERAL) 10 MG tablet TAKE 1 AND 1/2 TABLETS(15 MG) BY MOUTH TWICE DAILY 90 tablet 5     ROS: History obtained from the patient  General ROS: negative for - chills, fatigue, fever, night sweats, weight gain or weight loss Psychological ROS: negative for - behavioral disorder, hallucinations, memory difficulties, mood swings or suicidal ideation Ophthalmic ROS: negative for - blurry vision, double vision, eye pain or loss of vision ENT ROS: negative for - epistaxis, nasal discharge, oral lesions, sore throat, tinnitus or vertigo Allergy and Immunology ROS: negative for - hives or itchy/watery eyes Hematological and Lymphatic ROS: negative for - bleeding problems, bruising or swollen lymph nodes Endocrine ROS: negative for - galactorrhea, hair pattern changes, polydipsia/polyuria or temperature intolerance Respiratory ROS: negative for - cough, hemoptysis, shortness of breath or wheezing Cardiovascular ROS: negative for - chest pain, dyspnea on exertion, edema or irregular heartbeat Gastrointestinal ROS: negative for - abdominal pain, diarrhea, hematemesis, nausea/vomiting or stool incontinence Genito-Urinary ROS: negative for - dysuria, hematuria, incontinence or urinary frequency/urgency Musculoskeletal ROS: negative for - joint swelling or muscular weakness Neurological ROS: as noted in HPI Dermatological ROS: negative for rash and skin lesion changes  Physical Examination: Blood pressure 155/93, pulse 85, temperature 98.5 F (36.9 C), temperature source Oral, resp. rate 18, height _0  (1.803 m), weight 127.5 kg (281 lb 1.4 oz), SpO2 98 %.  HEENT-  Normocephalic, no lesions, without obvious abnormality.  Normal external  eye and conjunctiva.  Normal TM's bilaterally.  Normal auditory canals and external ears. Normal external nose, mucus membranes and septum.  Normal pharynx. Neck supple with no masses, nodes, nodules or enlargement. Cardiovascular - regular rate and rhythm, S1, S2 normal, no murmur, click, rub or gallop Lungs - chest clear, no wheezing, rales, normal  symmetric air entry Abdomen - soft, non-tender; bowel sounds normal; no masses,  no organomegaly Extremities - no joint deformities, effusion, or inflammation and no edema  Neurologic Examination: Mental Status: Alert, oriented, thought content appropriate.  Speech fluent without evidence of aphasia. Able to follow commands without difficulty. Cranial Nerves: II-Visual fields were normal. III/IV/VI-Pupils were equal and reacted normally to light. Extraocular movements were full and conjugate.    V/VII-no facial numbness and no facial weakness. VIII-normal. X-normal speech and symmetrical palatal movement. XI: trapezius strength/neck flexion strength normal bilaterally XII-midline tongue extension with normal strength. Motor: 5/5 bilaterally with normal tone and bulk Sensory: Normal throughout. Deep Tendon Reflexes: 1+ and symmetric. Plantars: Flexor bilaterally Cerebellar: Normal finger-to-nose testing. Carotid auscultation: Normal  Results for orders placed or performed during the hospital encounter of 10/28/15 (from the past 48 hour(s))  Protime-INR     Status: None   Collection Time: 10/28/15  5:59 PM  Result Value Ref Range   Prothrombin Time 14.7 11.4 - 15.0 seconds   INR 1.13   APTT     Status: None   Collection Time: 10/28/15  5:59 PM  Result Value Ref Range   aPTT 33 24 - 36 seconds  CBC     Status: Abnormal   Collection Time: 10/28/15  5:59 PM  Result Value Ref Range   WBC 8.8 3.8 - 10.6 K/uL   RBC 4.96 4.40 - 5.90 MIL/uL   Hemoglobin 14.7 13.0 - 18.0 g/dL   HCT 43.1 40.0 - 52.0 %   MCV 86.9 80.0 - 100.0 fL   MCH 29.7 26.0 - 34.0 pg   MCHC 34.2 32.0 - 36.0 g/dL   RDW 14.1 11.5 - 14.5 %   Platelets 110 (L) 150 - 440 K/uL  Differential     Status: None   Collection Time: 10/28/15  5:59 PM  Result Value Ref Range   Neutrophils Relative % 61% %   Neutro Abs 5.4 1.4 - 6.5 K/uL   Lymphocytes Relative 22% %   Lymphs Abs 1.9 1.0 - 3.6 K/uL   Monocytes Relative 8% %    Monocytes Absolute 0.7 0.2 - 1.0 K/uL   Eosinophils Relative 8% %   Eosinophils Absolute 0.7 0 - 0.7 K/uL   Basophils Relative 1% %   Basophils Absolute 0.1 0 - 0.1 K/uL  Comprehensive metabolic panel     Status: Abnormal   Collection Time: 10/28/15  5:59 PM  Result Value Ref Range   Sodium 138 135 - 145 mmol/L   Potassium 4.1 3.5 - 5.1 mmol/L    Comment: HEMOLYSIS AT THIS LEVEL MAY AFFECT RESULT   Chloride 106 101 - 111 mmol/L   CO2 24 22 - 32 mmol/L   Glucose, Bld 113 (H) 65 - 99 mg/dL   BUN 18 6 - 20 mg/dL   Creatinine, Ser 1.32 (H) 0.61 - 1.24 mg/dL   Calcium 9.1 8.9 - 10.3 mg/dL   Total Protein 7.3 6.5 - 8.1 g/dL   Albumin 4.3 3.5 - 5.0 g/dL   AST 23 15 - 41 U/L   ALT 20 17 - 63 U/L   Alkaline Phosphatase 61 38 - 126   U/L   Total Bilirubin 0.7 0.3 - 1.2 mg/dL   GFR calc non Af Amer 57 (L) >60 mL/min   GFR calc Af Amer >60 >60 mL/min    Comment: (NOTE) The eGFR has been calculated using the CKD EPI equation. This calculation has not been validated in all clinical situations. eGFR's persistently <60 mL/min signify possible Chronic Kidney Disease.    Anion gap 8 5 - 15  Troponin I     Status: None   Collection Time: 10/28/15  5:59 PM  Result Value Ref Range   Troponin I <0.03 <0.031 ng/mL    Comment:        NO INDICATION OF MYOCARDIAL INJURY.   Glucose, capillary     Status: Abnormal   Collection Time: 10/28/15  6:19 PM  Result Value Ref Range   Glucose-Capillary 107 (H) 65 - 99 mg/dL   Ct Head Wo Contrast  10/28/2015  CLINICAL DATA:  60 year old male with acute slurred speech for 1-1/2 hours. Code stroke. EXAM: CT HEAD WITHOUT CONTRAST TECHNIQUE: Contiguous axial images were obtained from the base of the skull through the vertex without intravenous contrast. COMPARISON:  None. FINDINGS: Mild periventricular hypodensities likely represent chronic small-vessel white matter ischemic changes. No acute intracranial abnormalities are identified, including mass lesion or  mass effect, hydrocephalus, extra-axial fluid collection, midline shift, hemorrhage, or acute infarction. The visualized bony calvarium is unremarkable. IMPRESSION: No evidence of acute intracranial abnormality. Probable chronic small-vessel white matter ischemic changes. Critical Value/emergent results were called by telephone at the time of interpretation on 10/28/2015 at 6:11 pm to Dr. Lenise Arena , who verbally acknowledged these results. Electronically Signed   By: Margarette Canada M.D.   On: 10/28/2015 18:15    Assessment: 60 y.o. male with multiple risk factors for stroke presenting with a possible small ischemic subcortical infarction.  Stroke Risk Factors - family history, hypertension and smoking  Plan: 1. HgbA1c, fasting lipid panel 2. MRI, MRA  of the brain without contrast 3. PT consult, OT consult, Speech consult 4. Echocardiogram 5. Carotid dopplers 6. Prophylactic therapy-Antiplatelet med: Aspirin  7. Risk factor modification 8. Telemetry monitoring  C.R. Nicole Kindred, MD [facility] [phone]  10/28/2015, 11:03 PM

## 2015-10-28 NOTE — ED Notes (Signed)
Report from olivia, rn.  

## 2015-10-28 NOTE — ED Notes (Signed)
States around 1600 to feel "weird", states his speech was slurred at 1630 when he called his fiancee, fiancee states she noticed his gait was off, upon arrival pt awake and alert, some mild slurred speech noted, some mild left facial droop noticed, fiancee at bedside

## 2015-10-28 NOTE — ED Notes (Signed)
[facility] here to transport pt. Report to [facility].

## 2015-10-29 ENCOUNTER — Inpatient Hospital Stay (HOSPITAL_COMMUNITY): Payer: Self-pay

## 2015-10-29 DIAGNOSIS — I63032 Cerebral infarction due to thrombosis of left carotid artery: Secondary | ICD-10-CM

## 2015-10-29 LAB — RAPID URINE DRUG SCREEN, HOSP PERFORMED
AMPHETAMINES: NOT DETECTED
Barbiturates: NOT DETECTED
Benzodiazepines: NOT DETECTED
Cocaine: NOT DETECTED
OPIATES: POSITIVE — AB
Tetrahydrocannabinol: NOT DETECTED

## 2015-10-29 LAB — LIPID PANEL
CHOLESTEROL: 167 mg/dL (ref 0–200)
HDL: 24 mg/dL — AB (ref >40)
LDL Cholesterol: 82 mg/dL (ref 0–99)
Total CHOL/HDL Ratio: 7 RATIO
Triglycerides: 303 mg/dL — ABNORMAL HIGH (ref <150)
VLDL: 61 mg/dL — ABNORMAL HIGH (ref 0–40)

## 2015-10-29 LAB — URIC ACID: URIC ACID, SERUM: 8.7 mg/dL — AB (ref 4.4–7.6)

## 2015-10-29 LAB — MRSA PCR SCREENING: MRSA by PCR: NEGATIVE

## 2015-10-29 MED ORDER — ATORVASTATIN CALCIUM 10 MG PO TABS
20.0000 mg | ORAL_TABLET | Freq: Every day | ORAL | Status: DC
  Administered 2015-10-30: 20 mg via ORAL
  Filled 2015-10-29: qty 2

## 2015-10-29 MED ORDER — COLCHICINE 0.6 MG PO TABS
0.6000 mg | ORAL_TABLET | Freq: Every day | ORAL | Status: DC
  Administered 2015-10-29: 0.6 mg via ORAL
  Filled 2015-10-29: qty 1

## 2015-10-29 MED ORDER — ASPIRIN 325 MG PO TABS
325.0000 mg | ORAL_TABLET | Freq: Every day | ORAL | Status: DC
  Administered 2015-10-29 – 2015-10-31 (×3): 325 mg via ORAL
  Filled 2015-10-29 (×3): qty 1

## 2015-10-29 MED ORDER — COLCHICINE 0.6 MG PO TABS
0.6000 mg | ORAL_TABLET | Freq: Three times a day (TID) | ORAL | Status: DC
  Administered 2015-10-29 – 2015-10-31 (×7): 0.6 mg via ORAL
  Filled 2015-10-29 (×7): qty 1

## 2015-10-29 MED ORDER — STROKE: EARLY STAGES OF RECOVERY BOOK
Freq: Once | Status: AC
  Administered 2015-10-29: 1
  Filled 2015-10-29: qty 1

## 2015-10-29 MED ORDER — PROPRANOLOL HCL 10 MG PO TABS
15.0000 mg | ORAL_TABLET | Freq: Two times a day (BID) | ORAL | Status: DC
  Administered 2015-10-29 – 2015-10-31 (×5): 15 mg via ORAL
  Filled 2015-10-29 (×5): qty 1.5

## 2015-10-29 MED ORDER — HYDROMORPHONE HCL 1 MG/ML IJ SOLN
2.0000 mg | INTRAMUSCULAR | Status: DC | PRN
  Administered 2015-10-29 – 2015-10-30 (×7): 2 mg via INTRAVENOUS
  Filled 2015-10-29 (×7): qty 2

## 2015-10-29 NOTE — Progress Notes (Signed)
STROKE TEAM PROGRESS NOTE   HISTORY OF PRESENT ILLNESS (per record) Colton BorsVictor Thaden Jr. is an 60 y.o. male with a history of hypertension, gout and discoid lupus, [facility] following acute onset of slurred speech and weakness of lower extremities. [name] was last known well at 4:00 PM. He has no previous history of stroke nor TIA. CT scan of his head showed no acute intracranial abnormality. He was evaluated by telemetry neurology and deemed to be a candidate for IV TPA which was administered. [name]'s deficits resolved subsequently. There were no apparent complications. [facility]. NIH stroke score at [place] time of this evaluation was 0  LSN: 4:00 PM on 10/28/2015 tPA Given: Yes mRankin:   SUBJECTIVE (INTERVAL HISTORY) [place] [name] is feeling better. His speech is almost back to normal. Smoking cessation discussed. [place] [name] committed to quitting. Blood pressure is adequately controlled.He is complaining of pain in his toes due to flareup of acute gout.   OBJECTIVE Temp:  [97.6 F (36.4 C)-98.5 F (36.9 C)] 97.6 F (36.4 C) (06/04 1200) Pulse Rate:  [72-93] 90 (06/04 0900) Cardiac Rhythm:  [-] Normal sinus rhythm (06/04 0800) Resp:  [11-24] 18 (06/04 0900) BP: (144-182)/(86-135) 151/135 mmHg (06/04 0900) SpO2:  [88 %-100 %] 100 % (06/04 0900) Weight:  [127.007 kg (280 lb)-127.5 kg (281 lb 1.4 oz)] 127.5 kg (281 lb 1.4 oz) (06/03 2130)  CBC:   Recent Labs Lab 10/28/15 1759  WBC 8.8  NEUTROABS 5.4  HGB 14.7  HCT 43.1  MCV 86.9  PLT 110*    Basic Metabolic Panel:   Recent Labs Lab 10/28/15 1759  NA 138  K 4.1  CL 106  CO2 24  GLUCOSE 113*  BUN 18  CREATININE 1.32*  CALCIUM 9.1    Lipid Panel:     Component Value Date/Time   CHOL 167 10/29/2015 0342   TRIG 303* 10/29/2015 0342   HDL 24* 10/29/2015 0342   CHOLHDL 7.0 10/29/2015 0342   VLDL 61* 10/29/2015 0342   LDLCALC 82 10/29/2015 0342   HgbA1c: No  results found for: HGBA1C Urine Drug Screen: No results found for: LABOPIA, COCAINSCRNUR, LABBENZ, AMPHETMU, THCU, LABBARB    IMAGING   Ct Head Wo Contrast 10/28/2015   No evidence of acute intracranial abnormality. Probable chronic small-vessel white matter ischemic changes.     Dg Chest Port 1 View 10/29/2015   1. No acute findings.  2. Cardiac enlargement.     PHYSICAL EXAM  Pleasant obese middle aged Caucasian male not in distress. . Afebrile. Head is nontraumatic. Neck is supple without bruit.    Cardiac exam no murmur or gallop. Lungs are clear to auscultation. Distal pulses are well felt. Neurological Exam ;  Awake  Alert oriented x 3. Normal speech and language.eye movements full without nystagmus.fundi were not visualized. Vision acuity and fields appear normal. Hearing is normal.Mild left lower facial asymmetry when he smiles only. Palatal movements are normal. Face symmetric. Tongue midline. Normal strength, tone, reflexes and coordination. Normal sensation. Gait deferred. NIH SS 1 ASSESSMENT/PLAN [name] is a 60 y.o. male with history of hypertension, gout, and discoid lupus presenting with speech difficulties and bilateral lower extremity weakness. He did receive [facility] t-PA due [facility] speech [facility].  Stroke:  Dominant Hemispheric TIA versus small infarct not visualized on CT scan  Resultant - improvement in speech deficits  MRI  pending  MRA  pending  Carotid Doppler  pending  2D Echo pending  LDL 82  HgbA1c pending  VTE prophylaxis - SCDs Diet Heart Room service appropriate?: Yes; Fluid consistency:: Thin  No antithrombotic prior to admission, now on No antithrombotic - secondary to TPA administration.  [name] counseled to be compliant with his antithrombotic medications  Ongoing aggressive stroke risk factor management  Therapy recommendations:  Pending  Disposition:  Pending  Hypertension  Diastolic blood pressure  somewhat high  Permissive hypertension (OK if < 220/120) but gradually normalize in 5-7 days  Hyperlipidemia  Home meds:  No lipid lowering medications prior to admission.  LDL 82, goal < 70  Add low-dose Lipitor 20 mg daily  Continue statin at discharge    Other Stroke Risk Factors  Advanced age  Cigarette smoker advised to stop smoking  ETOH use  Obesity, Body mass index is 39.22 kg/(m^2).    Other Active Problems  Possible obstructive sleep apnea - outpatient evaluation  Mild thrombocytopenia - 110  Mildly elevated creatinine - 1.32  Gout flare up - colchicine 0.6 mg 3 times a day for now. Check uric acid level. Decrease colchicine dose as tolerated.  Hypertension - [place] [name] may require additional medications or change to a different beta blocker.  PLAN  Begin to mobilize  Await follow-up imaging in order to start aspirin therapy  Check UDS.   Hospital day # 1  Delton See PA-C [facility] Pager [phone] 10/29/2015, 12:20 PM I have personally examined this [name], reviewed notes, independently viewed imaging studies, participated in medical decision making and plan of care. I have made any additions or clarifications directly to [place] above note. Agree with note above. Plan strict control of blood pressure. Check MRI scan of [place] brain dated today, echocardiogram and Doppler studies. Start colchicine 3 times daily for gout. If not better may add indomethacin tomorrow.Mobilize out of bed. This [name] is critically ill and at significant risk of neurological worsening, death and care requires constant monitoring of vital signs, hemodynamics,respiratory and cardiac monitoring, extensive review of multiple databases, frequent neurological assessment, discussion with family, other specialists and medical decision making of high complexity.I have made any additions or clarifications directly to [place] above note.This critical care time does not  reflect procedure time, or teaching time or supervisory time of PA/NP/Med Resident etc but could involve care discussion time.  I spent 30 minutes of neurocritical care time  in [place] care of  this [name].      Delia Heady, MD Medical Director [facility] Pager: [phone] 10/29/2015 1:19 PM    To contact Stroke Continuity provider, please refer to [email or web address]. After hours, contact General Neurology

## 2015-10-29 NOTE — Progress Notes (Signed)
PT Cancellation Note  Patient Details Name: Colton BorsVictor Ferrin Jr. MRN: [id number] DOB: 02-10-1956   Cancelled Treatment:    Reason Eval/Treat Not Completed: Patient not medically ready   On strict bedrest as of 21:36 yesterday;  Received TPA, and will likely be on bedrest for 24 hours;   Will follow up later today as time allows;  Otherwise, will follow up for PT tomorrow;   Thank you,  Colton ClinesHolly Solymar [name], PT  Acute Rehabilitation Services Pager [phone] Office [phone]      [facility] [name], [name] 10/29/2015, 7:56 AM

## 2015-10-29 NOTE — Progress Notes (Signed)
Speech Language Pathology Treatment:    Patient Details Name: Valene BorsVictor Maslowski Jr. MRN: [id number] DOB: 1955-07-01 Today's Date: 10/29/2015 Time: 1144-       Pt screened for speech-language-cognition. No ST needs at this time.    Royce MacadamiaLitaker, Alicen Donalson Willis 10/29/2015, 11:55 AM  [phone]

## 2015-10-30 ENCOUNTER — Inpatient Hospital Stay (HOSPITAL_COMMUNITY): Payer: Self-pay

## 2015-10-30 ENCOUNTER — Encounter (HOSPITAL_COMMUNITY): Payer: Self-pay | Admitting: *Deleted

## 2015-10-30 ENCOUNTER — Inpatient Hospital Stay (HOSPITAL_COMMUNITY): Payer: MEDICAID

## 2015-10-30 DIAGNOSIS — F17200 Nicotine dependence, unspecified, uncomplicated: Secondary | ICD-10-CM

## 2015-10-30 DIAGNOSIS — E785 Hyperlipidemia, unspecified: Secondary | ICD-10-CM

## 2015-10-30 DIAGNOSIS — M10079 Idiopathic gout, unspecified ankle and foot: Secondary | ICD-10-CM

## 2015-10-30 LAB — HEMOGLOBIN A1C
Hgb A1c MFr Bld: 6.2 % — ABNORMAL HIGH (ref 4.8–5.6)
Mean Plasma Glucose: 131 mg/dL

## 2015-10-30 MED ORDER — LORAZEPAM 2 MG/ML IJ SOLN
1.0000 mg | INTRAMUSCULAR | Status: DC | PRN
Start: 2015-10-30 — End: 2015-10-31
  Administered 2015-10-30: 1 mg via INTRAVENOUS
  Filled 2015-10-30: qty 1

## 2015-10-30 MED ORDER — COLCHICINE 0.6 MG PO TABS: 0.6000 mg | ORAL_TABLET | Freq: Every day | ORAL | Status: DC | PRN

## 2015-10-30 NOTE — Progress Notes (Signed)
Patient transferred from unit 66M to room 5C13 at 0930. Alert and in stable condition.

## 2015-10-30 NOTE — Care Management Note (Signed)
Case Management Note  Patient Details  Name: Colton BorsVictor Fetty Jr. MRN: [id number] Date of Birth: 12-11-1955  Subjective/Objective:    Pt admitted to r/o CVA and received TPA. He is from home alone.                Action/Plan: Awaiting PT/OT recs. CM following for d/c needs.   Expected Discharge Date:                  Expected Discharge Plan:     In-House Referral:     Discharge planning Services     Post Acute Care Choice:    Choice offered to:     DME Arranged:    DME Agency:     HH Arranged:    HH Agency:     Status of Service:  In process, will continue to follow  Medicare Important Message Given:    Date Medicare IM Given:    Medicare IM give by:    Date Additional Medicare IM Given:    Additional Medicare Important Message give by:     If discussed at Long Length of Stay Meetings, dates discussed:    Additional Comments:  [place]ton BaloKelli F Tonisha Silvey, RN 10/30/2015, 1:37 PM

## 2015-10-30 NOTE — Progress Notes (Signed)
*  PRELIMINARY RESULTS* Vascular Ultrasound Carotid Duplex (Doppler) has been completed.  There is no obvious evidence of hemodynamically significant internal carotid artery stenosis bilaterally. Vertebral arteries are patent with antegrade flow.  10/30/2015 4:23 PM Gertie FeyMichelle Abron Neddo, RVT, RDCS, RDMS

## 2015-10-30 NOTE — Progress Notes (Signed)
[facility].  Patient will be transferred to 5C13 within the hour to tele bed. Akeisha Lagerquist C 9:02 AM

## 2015-10-30 NOTE — Evaluation (Signed)
Physical Therapy Evaluation Patient Details Name: Colton BorsVictor Vo Jr. MRN: [id number] DOB: 01/01/1956 Today's Date: 10/30/2015   History of Present Illness  60 y.o. male with a history of hypertension, gout and discoid lupus, [facility] following acute onset of slurred speech and weakness of lower extremities now s/p TPA.  Clinical Impression  Patient seen for assessment, demonstrates no focal weakness or mobility concerns at this time. Patient at baseline level of function and was receptive to [facility] stroke education. No further acute PT needs, will sign off.     Follow Up Recommendations No PT follow up    Equipment Recommendations  None recommended by PT    Recommendations for Other Services       Precautions / Restrictions Restrictions Weight Bearing Restrictions: No      Mobility  Bed Mobility Overal bed mobility: Independent                Transfers Overall transfer level: Independent                  Ambulation/Gait Ambulation/Gait assistance: Independent Ambulation Distance (Feet): 80 Feet Assistive device: None Gait Pattern/deviations: Antalgic Gait velocity: decreased   General Gait Details: patient with antalgic gait re: Left foot gout, but overall mobilizing well: reports he is at baseline mobility  Stairs            Wheelchair Mobility    Modified Rankin (Stroke Patients Only) Modified Rankin (Stroke Patients Only) Pre-Morbid Rankin Score: No symptoms Modified Rankin: Moderate disability     Balance Overall balance assessment: Independent                               Standardized Balance Assessment Standardized Balance Assessment : Dynamic Gait Index           Pertinent Vitals/Pain Pain Assessment: Faces Faces Pain Scale: Hurts a little bit Pain Location: left foot Pain Descriptors / Indicators: Tender Pain Intervention(s): Monitored during session    Home Living Family/patient  expects to be discharged to:: Private residence Living Arrangements: Alone Available Help at Discharge: Friend(s);Personal care attendant;Available PRN/intermittently Type of Home: House Home Access: Stairs to enter Entrance Stairs-Rails: Right Entrance Stairs-Number of Steps: 4 Home Layout: One level Home Equipment: Grab bars - tub/shower      Prior Function Level of Independence: Needs assistance   Gait / Transfers Assistance Needed: independent with ambulation     Comments: has some assist for shopping and cleaning     Hand Dominance   Dominant Hand: Right    Extremity/Trunk Assessment   Upper Extremity Assessment: Overall WFL for tasks assessed           Lower Extremity Assessment: LLE deficits/detail   LLE Deficits / Details: reports painful Left foot NW:GNFAre:gout     Communication   Communication: No difficulties  Cognition Arousal/Alertness: Awake/alert Behavior During Therapy: Flat affect Overall Cognitive Status: Within Functional Limits for tasks assessed                      General Comments General comments (skin integrity, edema, etc.): educated patient on BE FAST signs of stroke    Exercises        Assessment/Plan    PT Assessment Patent does not need any further PT services  PT Diagnosis Difficulty walking   PT Problem List    PT Treatment Interventions     PT Goals (Current  goals can be found in the Care Plan section) Acute Rehab PT Goals PT Goal Formulation: All assessment and education complete, DC therapy    Frequency     Barriers to discharge        Co-evaluation               End of Session Equipment Utilized During Treatment: Gait belt Activity Tolerance: Patient tolerated treatment well Patient left: in chair;with call bell/phone within reach (wheel chair) Nurse Communication: Mobility status         Time: 1610-9604 PT Time Calculation (min) (ACUTE ONLY): 16 min   Charges:   PT Evaluation $PT Eval  Low Complexity: 1 Procedure     PT G CodesFabio Asa 11-19-15, 9:14 AM Charlotte Crumb, PT DPT  [phone]

## 2015-10-30 NOTE — Progress Notes (Signed)
Patient reports feels better and denies shortness of breath.

## 2015-10-30 NOTE — Progress Notes (Signed)
STROKE TEAM PROGRESS NOTE   SUBJECTIVE (INTERVAL HISTORY) Patient has done well over night. MRI neg for stroke. Complains with gout flair, in bilateral great toes, typically only in 1 foot at a time. Takes colchicine at home, no allopurinol. Transfer to floor.    OBJECTIVE Temp:  [97.6 F (36.4 C)-98.5 F (36.9 C)] 97.9 F (36.6 C) (06/05 0805) Pulse Rate:  [28-93] 93 (06/05 0805) Cardiac Rhythm:  [-] Normal sinus rhythm (06/05 0800) Resp:  [9-22] 21 (06/05 0805) BP: (134-170)/(75-135) 170/96 mmHg (06/05 0805) SpO2:  [91 %-100 %] 96 % (06/05 0805)  CBC:   Recent Labs Lab 10/28/15 1759  WBC 8.8  NEUTROABS 5.4  HGB 14.7  HCT 43.1  MCV 86.9  PLT 110*   Basic Metabolic Panel:   Recent Labs Lab 10/28/15 1759  NA 138  K 4.1  CL 106  CO2 24  GLUCOSE 113*  BUN 18  CREATININE 1.32*  CALCIUM 9.1   Lipid Panel:     Component Value Date/Time   CHOL 167 10/29/2015 0342   TRIG 303* 10/29/2015 0342   HDL 24* 10/29/2015 0342   CHOLHDL 7.0 10/29/2015 0342   VLDL 61* 10/29/2015 0342   LDLCALC 82 10/29/2015 0342   HgbA1c: No results found for: HGBA1C Urine Drug Screen:     Component Value Date/Time   LABOPIA POSITIVE* 10/29/2015 1854   COCAINSCRNUR NONE DETECTED 10/29/2015 1854   LABBENZ NONE DETECTED 10/29/2015 1854   AMPHETMU NONE DETECTED 10/29/2015 1854   THCU NONE DETECTED 10/29/2015 1854   LABBARB NONE DETECTED 10/29/2015 1854      IMAGING I have personally reviewed the radiological images below and agree with the radiology interpretations.  Ct Head Wo Contrast 10/28/2015   No evidence of acute intracranial abnormality. Probable chronic small-vessel white matter ischemic changes.   Dg Chest Port 1 View 10/29/2015   1. No acute findings.  2. Cardiac enlargement.   Mri & Mra brain w/o Contrast 10/29/2015   1. Intermittently motion and artifact degraded exam with no acute intracranial abnormality identified. 2. Patchy cerebral white matter signal changes and  small chronic micro hemorrhages in the left cerebral and both cerebellar hemispheres. These are nonspecific but favor related to chronic small vessel disease. 3. Intracranial MRA is degraded by artifact in key areas of the study, but there is no strong evidence of high-grade intracranial large vessel stenosis or occlusion.   CUS  - There is no obvious evidence of hemodynamically significant internal carotid artery stenosis bilaterally. Vertebral arteries are patent with antegrade flow.  TTE - pending   PHYSICAL EXAM Pleasant obese middle aged Caucasian male not in distress. . Afebrile. Head is nontraumatic. Neck is supple without bruit. Cardiac exam no murmur or gallop. Lungs are clear to auscultation. Distal pulses are well felt. Bilateral great toe redness, tenderness on palpation.  Neurological Exam:  Awake  Alert oriented x 3. Normal speech and language. PERRL, eye movements full without nystagmus. fundi were not visualized. Vision acuity and fields appear normal. Hearing is normal. Facial symmetrical. Palatal movements are normal. Face symmetric. Tongue midline. Normal strength, tone, reflexes and coordination. Normal sensation. Gait deferred.   ASSESSMENT/PLAN Mr. Lenvil Swaim. is a 60 y.o. male with history of hypertension, gout, and discoid lupus presenting with speech difficulties and bilateral lower extremity weakness. He did receive IV t-PA due to speech difficulties at [facility].  Bilateral great toe gout - likely the reason for presenting b/l LE mild weakness   Resolved after 1 hour  of tPA  Hx of gout wit frequent flair  On colchicine at home  Resume home meds  Uric acid 8.7 mildly elevated  Consider steroids if not effective  No acute stroke s/p tPA -  Doubt TIA  Resultant deficit resolved  MRI  No acute stroke. small vessel disease   MRA  No significant stenosis   Carotid Doppler unremarkable  2D Echo pending  LDL 82  HgbA1c 6.2  VTE prophylaxis -  SCDs Diet Heart Room service appropriate?: Yes; Fluid consistency:: Thin  No antithrombotic prior to admission, now on aspirin 325 mg daily.  Patient counseled to be compliant with his antithrombotic medications  Ongoing aggressive stroke risk factor management  Therapy recommendations:  pending  Disposition:  pending  D/C IVF  Transferred to floor  Hypertension  Stable  BP goal normotensive   Hyperlipidemia  Home meds:  No lipid lowering medications prior to admission.  LDL 82, goal < 70  Added low-dose Lipitor 20 mg daily  Continue statin at discharge  Tobacco abuse  Current smoker  Smoking cessation counseling provided  Pt is willing to quit  Other Stroke Risk Factors  Advanced age  ETOH use  UDS positive for opiates  Obesity, Body mass index is 39.22 kg/(m^2).   Possible obstructive sleep apnea - outpatient evaluation  Other Active Problems  Mild thrombocytopenia - 110  Mildly elevated creatinine - 1.32  Hospital day # 2  Marvel PlanJindong Zarriah Starkel, MD PhD Stroke Neurology 10/30/2015 8:13 PM    To contact Stroke Continuity provider, please refer to [email or web address]. After hours, contact General Neurology

## 2015-10-30 NOTE — Evaluation (Signed)
Occupational Therapy Evaluation and Discharged Patient Details Name: Colton [name]. MRN: [id number] DOB: 05-18-1956 Today's Date: 10/30/2015    History of Present Illness 60 y.o. male with a history of hypertension, gout and discoid lupus, presented to the ED at [facility] following acute onset of slurred speech and weakness of lower extremities now s/p TPA with CT and MRI negative   Clinical Impression   This 60 yo male admitted with above presents to acute OT at an independent level, no further OT warranted, we will sign off.    Follow Up Recommendations  No OT follow up    Equipment Recommendations  None recommended by OT       Precautions / Restrictions Precautions Precautions: None Restrictions Weight Bearing Restrictions: No      Mobility Bed Mobility Overal bed mobility: Independent                Transfers Overall transfer level: Independent                    Balance Overall balance assessment: Independent                               Standardized Balance Assessment Standardized Balance Assessment : Dynamic Gait Index          ADL Overall ADL's : Independent                                                       Pertinent Vitals/Pain Pain Assessment: Faces Faces Pain Scale: Hurts a little bit Pain Location: left foot due to gout Pain Descriptors / Indicators: Tender Pain Intervention(s): Monitored during session     Hand Dominance Right   Extremity/Trunk Assessment Upper Extremity Assessment Upper Extremity Assessment: Overall WFL for tasks assessed   Lower Extremity Assessment Lower Extremity Assessment: LLE deficits/detail LLE Deficits / Details: reports painful Left foot [id number]       Communication Communication Communication: No difficulties   Cognition Arousal/Alertness: Awake/alert Behavior During Therapy: WFL for tasks assessed/performed Overall Cognitive Status: Within  Functional Limits for tasks assessed                                Home Living Family/patient expects to be discharged to:: Private residence Living Arrangements: Alone Available Help at Discharge: Friend(s);Personal care attendant;Available PRN/intermittently Type of Home: House Home Access: Stairs to enter [employer] of Steps: 4 Entrance Stairs-Rails: Right Home Layout: One level     Bathroom Shower/Tub: IT trainer: Standard Bathroom Accessibility: Yes   Home Equipment: Grab bars - tub/shower          Prior Functioning/Environment Level of Independence: Needs assistance  Gait / Transfers Assistance Needed: independent with ambulation     Comments: has some assist for shopping and cleaning    OT Diagnosis: Generalized weakness         OT Goals(Current goals can be found in the care plan section) Acute Rehab OT Goals Patient Stated Goal: did not state  OT Frequency:             Co-evaluation PT/OT/SLP Co-Evaluation/Treatment: Yes     OT goals addressed during session: ADL's and self-care;Strengthening/ROM  End of Session    Activity Tolerance: Patient tolerated treatment well Patient left:  (in W/C getting ready to transfer to other floor)   Time: [id number] OT Time Calculation (min): 16 min Charges:  OT General Charges $OT Visit: 1 Procedure OT Evaluation $OT Eval Low Complexity: 1 Procedure  Evette GeorgesLeonard, Cortana Vanderford Eva [phone] 10/30/2015, 9:43 AM

## 2015-10-30 NOTE — Progress Notes (Signed)
Patient complaining of shortness of breath and anxiety. Reports he believes shortness of breath may be related to anxiety. No acute distress noted, no tachypnea, and oxygen saturation 97% on room air. No neuro changes. Patient denies pain or any other complaints. Patient placed on 2 liters of oxygen via nasal canula for comfort. Dr. Roseanne RenoStewart notified. Order for Ativan prn placed as directed by MD. Patient also complaining of difficulty urinating. States it takes a while for stream to start, and reports retention. Bladder scan showed 476 mL. In and out catheterization performed as directed by Dr. Roseanne RenoStewart with 500 mL out.

## 2015-10-31 ENCOUNTER — Inpatient Hospital Stay (HOSPITAL_COMMUNITY): Payer: MEDICAID

## 2015-10-31 DIAGNOSIS — I429 Cardiomyopathy, unspecified: Secondary | ICD-10-CM

## 2015-10-31 DIAGNOSIS — I10 Essential (primary) hypertension: Secondary | ICD-10-CM

## 2015-10-31 DIAGNOSIS — I639 Cerebral infarction, unspecified: Secondary | ICD-10-CM

## 2015-10-31 DIAGNOSIS — M109 Gout, unspecified: Secondary | ICD-10-CM | POA: Diagnosis present

## 2015-10-31 DIAGNOSIS — I6789 Other cerebrovascular disease: Secondary | ICD-10-CM

## 2015-10-31 DIAGNOSIS — F17200 Nicotine dependence, unspecified, uncomplicated: Secondary | ICD-10-CM | POA: Diagnosis present

## 2015-10-31 DIAGNOSIS — E669 Obesity, unspecified: Secondary | ICD-10-CM

## 2015-10-31 LAB — VAS US CAROTID
LCCADSYS: -58 cm/s
LEFT ECA DIAS: -10 cm/s
LEFT VERTEBRAL DIAS: 11 cm/s
LICAPDIAS: -24 cm/s
LICAPSYS: -53 cm/s
Left CCA dist dias: -20 cm/s
Left CCA prox dias: 19 cm/s
Left CCA prox sys: 68 cm/s
Left ICA dist dias: -25 cm/s
Left ICA dist sys: -63 cm/s
RCCAPDIAS: 13 cm/s
RCCAPSYS: 63 cm/s
RIGHT ECA DIAS: -12 cm/s
RIGHT VERTEBRAL DIAS: 12 cm/s
Right cca dist sys: 36 cm/s

## 2015-10-31 LAB — ECHOCARDIOGRAM COMPLETE
Height: 71 in
WEIGHTICAEL: 4497.38 [oz_av]

## 2015-10-31 MED ORDER — ATORVASTATIN CALCIUM 20 MG PO TABS: 20.0000 mg | ORAL_TABLET | Freq: Every day | ORAL | Status: DC

## 2015-10-31 MED ORDER — PROPRANOLOL HCL 10 MG PO TABS: ORAL_TABLET | ORAL | Status: DC

## 2015-10-31 MED ORDER — ASPIRIN 325 MG PO TABS: 325.0000 mg | ORAL_TABLET | Freq: Every day | ORAL | Status: DC

## 2015-10-31 MED ORDER — MENTHOL 3 MG MT LOZG
1.0000 | LOZENGE | OROMUCOSAL | Status: DC | PRN
  Administered 2015-10-31: 3 mg via ORAL
  Filled 2015-10-31: qty 9

## 2015-10-31 MED ORDER — COLCHICINE 0.6 MG PO TABS: 0.6000 mg | ORAL_TABLET | Freq: Every day | ORAL | Status: DC | PRN

## 2015-10-31 NOTE — Care Management Note (Signed)
Case Management Note  Patient Details  Name: Valene BorsVictor Tabak Jr. MRN: [id number] Date of Birth: [birth year]  Subjective/Objective:                    Action/Plan: Pt discharging home today with self care. Pt states he has a PCP a Dr Para Marchuncan with [facility]. Pt states he does not have insurance. Pt not interested in being set up at the clinic and states he does not have any problems affording his medications. No further needs per CM.   Expected Discharge Date:                  Expected Discharge Plan:  Home/Self Care  In-House Referral:     Discharge planning Services     Post Acute Care Choice:    Choice offered to:     DME Arranged:    DME Agency:     HH Arranged:    HH Agency:     Status of Service:  Completed, signed off  Medicare Important Message Given:    Date Medicare IM Given:    Medicare IM give by:    Date Additional Medicare IM Given:    Additional Medicare Important Message give by:     If discussed at Long Length of Stay Meetings, dates discussed:    Additional Comments:  Kermit BaloKelli F Elianis Fischbach, RN 10/31/2015, 1:41 PM

## 2015-10-31 NOTE — Discharge Summary (Signed)
Stroke Discharge Summary  Patient ID: Colton SickleVictor Liguori Jr.    l   MRN: [id number]      DOB: January 11, 1956  Date of Admission: 10/28/2015 Date of Discharge: 10/31/2015  Attending Physician:  Marvel PlanJindong Elsbeth Yearick, MD, Stroke MD Patient's PCP:  Crawford GivensGraham Duncan, MD  DISCHARGE DIAGNOSIS:  Principal Problem:   Stroke-like episode [facility](HCC) s/p IV tPA Active Problems:   HTN (hypertension)   Gout attack   Cardiomyopathy (HCC)   Hyperlipidemia   Tobacco use disorder   Obesity  BMI: Body mass index is 39.22 kg/(m^2).  Past Medical History  Diagnosis Date  . [facility]  . Discoid lupus    Past Surgical History  Procedure Laterality Date  . Cardiolite  08/16/2008    [facility]      Medication List    TAKE these medications        aspirin 325 MG tablet  Take 1 tablet (325 mg total) by mouth daily.     atorvastatin 20 MG tablet  Commonly known as:  LIPITOR  Take 1 tablet (20 mg total) by mouth daily at 6 PM.     colchicine 0.6 MG tablet  Take 1 tablet (0.6 mg total) by mouth daily as needed (gout). Take up to tid for the next couple of days for pain relief post hospitalization     indomethacin 50 MG capsule  Commonly known as:  INDOCIN  Take 1 capsule (50 mg total) by mouth 2 (two) times daily with a meal. As needed for gout     nicotine 21 mg/24hr patch  Commonly known as:  NICODERM CQ - dosed in mg/24 hours  Place 21 mg onto the skin daily.     propranolol 10 MG tablet  Commonly known as:  INDERAL  TAKE 1 AND 1/2 TABLETS(15 MG) BY MOUTH TWICE DAILY     VISINE OP  Place 1 drop into both eyes as needed (for dry eyes).        LABORATORY STUDIES CBC    Component Value Date/Time   WBC 8.8 10/28/2015 1759   RBC 4.96 10/28/2015 1759   HGB 14.7 10/28/2015 1759   HCT 43.1 10/28/2015 1759   PLT 110* 10/28/2015 1759   MCV 86.9 10/28/2015 1759   MCH 29.7 10/28/2015 1759   MCHC 34.2 10/28/2015 1759   RDW 14.1 10/28/2015 1759   LYMPHSABS 1.9 10/28/2015 1759    MONOABS 0.7 10/28/2015 1759   EOSABS 0.7 10/28/2015 1759   BASOSABS 0.1 10/28/2015 1759   CMP    Component Value Date/Time   NA 138 10/28/2015 1759   K 4.1 10/28/2015 1759   CL 106 10/28/2015 1759   CO2 24 10/28/2015 1759   GLUCOSE 113* 10/28/2015 1759   BUN 18 10/28/2015 1759   CREATININE 1.32* 10/28/2015 1759   CALCIUM 9.1 10/28/2015 1759   PROT 7.3 10/28/2015 1759   ALBUMIN 4.3 10/28/2015 1759   AST 23 10/28/2015 1759   ALT 20 10/28/2015 1759   ALKPHOS 61 10/28/2015 1759   BILITOT 0.7 10/28/2015 1759   GFRNONAA 57* 10/28/2015 1759   GFRAA >60 10/28/2015 1759   COAGS Lab Results  Component Value Date   INR 1.13 10/28/2015   INR 1.04 11/17/2010   Lipid Panel    Component Value Date/Time   CHOL 167 10/29/2015 0342   TRIG 303* 10/29/2015 0342   HDL 24* 10/29/2015 0342   CHOLHDL 7.0 10/29/2015 0342   VLDL 61* 10/29/2015 0342   LDLCALC 82  10/29/2015 0342   HgbA1C  Lab Results  Component Value Date   HGBA1C 6.2* 10/29/2015   Cardiac Panel (last 3 results)   Recent Labs  10/28/15 1759  TROPONINI <0.03   Urine Drug Screen     Component Value Date/Time   LABOPIA POSITIVE* 10/29/2015 1854   COCAINSCRNUR NONE DETECTED 10/29/2015 1854   LABBENZ NONE DETECTED 10/29/2015 1854   AMPHETMU NONE DETECTED 10/29/2015 1854   THCU NONE DETECTED 10/29/2015 1854   LABBARB NONE DETECTED 10/29/2015 1854    SIGNIFICANT DIAGNOSTIC STUDIES Ct Head Wo Contrast 10/28/2015  No evidence of acute intracranial abnormality. Probable chronic small-vessel white matter ischemic changes.   Dg Chest Port 1 View 10/29/2015  1. No acute findings.  2. Cardiac enlargement.   Mri & Mra brain w/o Contrast 10/29/2015  1. Intermittently motion and artifact degraded exam with no acute intracranial abnormality identified. 2. Patchy cerebral white matter signal changes and small chronic micro hemorrhages in the left cerebral and both cerebellar hemispheres. These are nonspecific but  favor related to chronic small vessel disease. 3. Intracranial MRA is degraded by artifact in key areas of the study, but there is no strong evidence of high-grade intracranial large vessel stenosis or occlusion.   Carotid Ultrasound - There is no obvious evidence of hemodynamically significant internal carotid artery stenosis bilaterally. Vertebral arteries are patent with antegrade flow.  2D Echocardiogram - Left ventricle: The cavity size was moderately dilated. Wall thickness was normal. Systolic function was moderately reduced. The estimated ejection fraction was in the range of 35% to 40%. Features are consistent with a pseudonormal left ventricular filling pattern, with concomitant abnormal relaxation and increased filling pressure (grade 2 diastolic dysfunction).     HISTORY OF PRESENT ILLNESS Colton Marchetta. is an 60 y.o. male with a history of hypertension, gout and discoid lupus, presented to the ED at [facility] following acute onset of slurred speech and weakness of lower extremities. Patient was last known well at 4:00 PM on 10/28/2015. He has no previous history of stroke nor TIA. CT scan of his head showed no acute intracranial abnormality. He was evaluated by telemetry neurology and deemed to be a candidate for IV TPA which was administered. Patient's deficits resolved subsequently. There were no apparent complications. He was transferred to [facility] for further management. NIH stroke score at the time of this evaluation was 0.     HOSPITAL COURSE [name]. is a 60 y.o. male with history of hypertension, gout, and discoid lupus presenting with speech difficulties and bilateral lower extremity weakness. He received IV t-PA due to speech difficulties at [facility] and was transferred to [facility]. He tolerated TPA without difficulty. MRI neg for acute stroke; not felt to have TIA. Problem of significance seemed to be bilateral LE gout flare. He was started on routine colchicine. Incidental EF  of 35-40% was noted. Patient has cardiologist out of town he wanted to follow up with. Discharged home with follow up with Dr. Pearlean Brownie.  Bilateral great toe gout - likely the reason for presenting b/l LE mild weakness   Resolved after 1 hour of tPA  Hx of gout wit frequent flair  On colchicine at home  Resume home meds  Uric acid 8.7 mildly elevated  Pain much improved  Stroke like symptom s/p IV tPA - No stroke. Doubt TIA  Resultant deficit resolved  MRI No acute stroke. small vessel disease  MRA No significant stenosis  Carotid Doppler unremarkable  2D Echo EF 35-40%.  No source of embolus.    LDL 82  HgbA1c 6.2  No antithrombotic prior to admission, now on aspirin 325 mg daily. Continue at discharge  Patient counseled to be compliant with his antithrombotic medications  Ongoing aggressive stroke risk factor management  Therapy recommendations: no therapy needs  Disposition: return home  Cardiomyopathy  2D Echo EF 35-40%. No source of embolus.    Patient has cardiologist who he wants to follow up with as an OP  Hypertension  Stable  BP goal normotensive  Hyperlipidemia  Home meds: No lipid lowering medications prior to admission.  LDL 82  Added low-dose Lipitor 20 mg daily  Continue statin at discharge  Tobacco abuse  Current smoker  Smoking cessation counseling provided  Pt is willing to quit  Other Stroke Risk Factors  Advanced age  ETOH use  UDS positive for opiates  Obesity, Body mass index is 39.22 kg/(m^2).   Possible obstructive sleep apnea - outpatient evaluation  Other Active Problems  Mild thrombocytopenia - 110  Mildly elevated creatinine - 1.32   DISCHARGE EXAM Blood pressure 145/101, pulse 85, temperature 98.7 F (37.1 C), temperature source Oral, resp. rate 22, height 5\' 11"  (1.803 m), weight 127.5 kg (281 lb 1.4 oz), SpO2 98 %. Pleasant obese middle aged Caucasian male not in distress. .  Afebrile. Head is nontraumatic. Neck is supple without bruit. Cardiac exam no murmur or gallop. Lungs are clear to auscultation. Distal pulses are well felt. Bilateral great toe redness, tenderness on palpation.  Neurological Exam:  Awake Alert oriented x 3. Normal speech and language. PERRL, eye movements full without nystagmus. fundi were not visualized. Vision acuity and fields appear normal. Hearing is normal. Facial symmetrical. Palatal movements are normal. Face symmetric. Tongue midline. Normal strength, tone, reflexes and coordination. Normal sensation. Gait deferred.  Discharge Diet   Diet Heart Room service appropriate?: Yes; Fluid consistency:: Thin liquids  DISCHARGE PLAN  Disposition:  home   Given low EF, continue aspirin 325 mg daily   Follow-up Crawford Givens, MD in 2 weeks.  Follow-up with Dr. Delia Heady, Stroke Clinic in 2 months, office to schedule an appointment.  Follow-up with cardiologist related to low EF  Recommend OP sleep evaluation for obstructive sleep apnea   40 minutes were spent preparing discharge.  [facility] See Amion for Pager information 10/31/2015 1:58 PM   I, the attending vascular neurologist, have personally obtained a history, examined the patient, evaluated laboratory data, individually viewed imaging studies and agree with radiology interpretations. Together with the NP/PA, we formulated the assessment and plan of care which reflects our mutual decision.  I have made any additions or clarifications directly to the above note and agree with the findings and plan as currently documented. Pt prefers to follow up with Dr. Pearlean Brownie in clinic.   Marvel Plan, MD PhD Stroke Neurology 10/31/2015 10:35 PM

## 2015-10-31 NOTE — Progress Notes (Signed)
Pt discharged from hospital per orders from MD. Pt educated on discharge instructions. Pt verbalized understanding of instructions. All questions and concerns were addressed. Pt's IV was removed before discharge. Pt exited hospital via wheelchair. 

## 2015-10-31 NOTE — Progress Notes (Signed)
  Echocardiogram 2D Echocardiogram has been performed.  Colton [name] 10/31/2015, 11:15 AM

## 2015-11-01 ENCOUNTER — Telehealth: Payer: Self-pay | Admitting: *Deleted

## 2015-11-01 NOTE — Telephone Encounter (Signed)
Transition Care Management Follow-up Telephone Call  Date discharged?  10/31/2015   How have you been since you were released from the hospital? Doing fine, no complaints   Do you understand why you were in the hospital? yes   Do you understand the discharge instructions? yes   Where were you discharged to? Home   Items Reviewed:  Medications reviewed: Yes  allergies reviewed: Yes  Dietary changes reviewed: no  Referrals reviewed: yes, patient has contacted Dr. Marlis EdelsonSethi's office for follow-up and they are mailing him information regarding his appointment there   Functional Questionnaire:   Activities of Daily Living (ADLs):   He states they are independent in the following: ambulation, bathing and hygiene, feeding, continence, grooming, toileting and dressing States they require assistance with the following: none, back at baseline   Any transportation issues/concerns?: no   Any patient concerns? no   Confirmed importance and date/time of follow-up visits scheduled yes  Provider Appointment booked with Dr Para Marchuncan 11/06/15  Confirmed with patient if condition begins to worsen call PCP or go to the ER.  Patient was given the office number and encouraged to call back with question or concerns.  :Yes

## 2015-11-02 NOTE — H&P (Signed)
Admission H&P    Chief Complaint: Slurred speech and weakness in lower extremities.  HPI: Colton [name]. is an 60 y.o. male with a history of hypertension, gout and discoid lupus, presented to the ED at [facility] following acute onset of slurred speech and weakness of lower extremities. Patient was last known well at 4:00 PM. He has no previous history of stroke nor TIA. CT scan of his head showed no acute intracranial abnormality. He was evaluated by telemetry neurology and deemed to be a candidate for IV TPA which was administered. Patient's deficits resolved subsequently. There were no apparent complications. He was transferred to [facility] for further management. NIH stroke score at the time of this evaluation was 0  LSN: 4:00 PM on 10/28/2015 tPA Given: Yes mRankin:  Past Medical History  Diagnosis Date  . Gout 07/2001    [facility]  . Discoid lupus     Past Surgical History  Procedure Laterality Date  . Cardiolite  08/16/2008    [facility]    Family History  Problem Relation Age of Onset  . Heart disease Father     CAD  . Heart disease Other     CAD  . Cancer Other     Lung  . Colon cancer Neg Hx   . Prostate cancer Neg Hx    Social History:  reports that he has quit smoking. His smoking use included Cigarettes. He has a 60 pack-year smoking history. He has never used smokeless tobacco. He reports that he drinks alcohol. He reports that he does not use illicit drugs.  Allergies:  Allergies  Allergen Reactions  . Corticosteroids     Muscle spasms    Medications Prior to Admission  Medication Sig Dispense Refill  . colchicine 0.6 MG tablet Take 1 tablet (0.6 mg total) by mouth daily as needed (gout). 30 tablet 0  . indomethacin (INDOCIN) 50 MG capsule Take 1 capsule (50 mg total) by mouth 2 (two) times daily with a meal. As needed for gout 60 capsule 0  . propranolol (INDERAL) 10 MG tablet TAKE 1 AND 1/2 TABLETS(15 MG) BY MOUTH TWICE DAILY 90 tablet 5     ROS: History obtained from the patient  General ROS: negative for - chills, fatigue, fever, night sweats, weight gain or weight loss Psychological ROS: negative for - behavioral disorder, hallucinations, memory difficulties, mood swings or suicidal ideation Ophthalmic ROS: negative for - blurry vision, double vision, eye pain or loss of vision ENT ROS: negative for - epistaxis, nasal discharge, oral lesions, sore throat, tinnitus or vertigo Allergy and Immunology ROS: negative for - hives or itchy/watery eyes Hematological and Lymphatic ROS: negative for - bleeding problems, bruising or swollen lymph nodes Endocrine ROS: negative for - galactorrhea, hair pattern changes, polydipsia/polyuria or temperature intolerance Respiratory ROS: negative for - cough, hemoptysis, shortness of breath or wheezing Cardiovascular ROS: negative for - chest pain, dyspnea on exertion, edema or irregular heartbeat Gastrointestinal ROS: negative for - abdominal pain, diarrhea, hematemesis, nausea/vomiting or stool incontinence Genito-Urinary ROS: negative for - dysuria, hematuria, incontinence or urinary frequency/urgency Musculoskeletal ROS: negative for - joint swelling or muscular weakness Neurological ROS: as noted in HPI Dermatological ROS: negative for rash and skin lesion changes  Physical Examination: Blood pressure 155/93, pulse 85, temperature 98.5 F (36.9 C), temperature source Oral, resp. rate 18, height 5' 11" (1.803 m), weight 127.5 kg (281 lb 1.4 oz), SpO2 98 %.  HEENT-  Normocephalic, no lesions, without obvious abnormality.  Normal external  eye and conjunctiva.  Normal TM's bilaterally.  Normal auditory canals and external ears. Normal external nose, mucus membranes and septum.  Normal pharynx. Neck supple with no masses, nodes, nodules or enlargement. Cardiovascular - regular rate and rhythm, S1, S2 normal, no murmur, click, rub or gallop Lungs - chest clear, no wheezing, rales, normal  symmetric air entry Abdomen - soft, non-tender; bowel sounds normal; no masses,  no organomegaly Extremities - no joint deformities, effusion, or inflammation and no edema  Neurologic Examination: Mental Status: Alert, oriented, thought content appropriate.  Speech fluent without evidence of aphasia. Able to follow commands without difficulty. Cranial Nerves: II-Visual fields were normal. III/IV/VI-Pupils were equal and reacted normally to light. Extraocular movements were full and conjugate.    V/VII-no facial numbness and no facial weakness. VIII-normal. X-normal speech and symmetrical palatal movement. XI: trapezius strength/neck flexion strength normal bilaterally XII-midline tongue extension with normal strength. Motor: 5/5 bilaterally with normal tone and bulk Sensory: Normal throughout. Deep Tendon Reflexes: 1+ and symmetric. Plantars: Flexor bilaterally Cerebellar: Normal finger-to-nose testing. Carotid auscultation: Normal  Results for orders placed or performed during the hospital encounter of 10/28/15 (from the past 48 hour(s))  Protime-INR     Status: None   Collection Time: 10/28/15  5:59 PM  Result Value Ref Range   Prothrombin Time 14.7 11.4 - 15.0 seconds   INR 1.13   APTT     Status: None   Collection Time: 10/28/15  5:59 PM  Result Value Ref Range   aPTT 33 24 - 36 seconds  CBC     Status: Abnormal   Collection Time: 10/28/15  5:59 PM  Result Value Ref Range   WBC 8.8 3.8 - 10.6 K/uL   RBC 4.96 4.40 - 5.90 MIL/uL   Hemoglobin 14.7 13.0 - 18.0 g/dL   HCT 43.1 40.0 - 52.0 %   MCV 86.9 80.0 - 100.0 fL   MCH 29.7 26.0 - 34.0 pg   MCHC 34.2 32.0 - 36.0 g/dL   RDW 14.1 11.5 - 14.5 %   Platelets 110 (L) 150 - 440 K/uL  Differential     Status: None   Collection Time: 10/28/15  5:59 PM  Result Value Ref Range   Neutrophils Relative % 61% %   Neutro Abs 5.4 1.4 - 6.5 K/uL   Lymphocytes Relative 22% %   Lymphs Abs 1.9 1.0 - 3.6 K/uL   Monocytes Relative 8% %    Monocytes Absolute 0.7 0.2 - 1.0 K/uL   Eosinophils Relative 8% %   Eosinophils Absolute 0.7 0 - 0.7 K/uL   Basophils Relative 1% %   Basophils Absolute 0.1 0 - 0.1 K/uL  Comprehensive metabolic panel     Status: Abnormal   Collection Time: 10/28/15  5:59 PM  Result Value Ref Range   Sodium 138 135 - 145 mmol/L   Potassium 4.1 3.5 - 5.1 mmol/L    Comment: HEMOLYSIS AT THIS LEVEL MAY AFFECT RESULT   Chloride 106 101 - 111 mmol/L   CO2 24 22 - 32 mmol/L   Glucose, Bld 113 (H) 65 - 99 mg/dL   BUN 18 6 - 20 mg/dL   Creatinine, Ser 1.32 (H) 0.61 - 1.24 mg/dL   Calcium 9.1 8.9 - 10.3 mg/dL   Total Protein 7.3 6.5 - 8.1 g/dL   Albumin 4.3 3.5 - 5.0 g/dL   AST 23 15 - 41 U/L   ALT 20 17 - 63 U/L   Alkaline Phosphatase 61 38 - 126  U/L   Total Bilirubin 0.7 0.3 - 1.2 mg/dL   GFR calc non Af Amer 57 (L) >60 mL/min   GFR calc Af Amer >60 >60 mL/min    Comment: (NOTE) The eGFR has been calculated using the CKD EPI equation. This calculation has not been validated in all clinical situations. eGFR's persistently <60 mL/min signify possible Chronic Kidney Disease.    Anion gap 8 5 - 15  Troponin I     Status: None   Collection Time: 10/28/15  5:59 PM  Result Value Ref Range   Troponin I <0.03 <0.031 ng/mL    Comment:        NO INDICATION OF MYOCARDIAL INJURY.   Glucose, capillary     Status: Abnormal   Collection Time: 10/28/15  6:19 PM  Result Value Ref Range   Glucose-Capillary 107 (H) 65 - 99 mg/dL   Ct Head Wo Contrast  10/28/2015  CLINICAL DATA:  60 year old male with acute slurred speech for 1-1/2 hours. Code stroke. EXAM: CT HEAD WITHOUT CONTRAST TECHNIQUE: Contiguous axial images were obtained from the base of the skull through the vertex without intravenous contrast. COMPARISON:  None. FINDINGS: Mild periventricular hypodensities likely represent chronic small-vessel white matter ischemic changes. No acute intracranial abnormalities are identified, including mass lesion or  mass effect, hydrocephalus, extra-axial fluid collection, midline shift, hemorrhage, or acute infarction. The visualized bony calvarium is unremarkable. IMPRESSION: No evidence of acute intracranial abnormality. Probable chronic small-vessel white matter ischemic changes. Critical Value/emergent results were called by telephone at the time of interpretation on 10/28/2015 at 6:11 pm to Dr. Lenise Arena , who verbally acknowledged these results. Electronically Signed   By: Margarette Canada M.D.   On: 10/28/2015 18:15    Assessment: 60 y.o. male with multiple risk factors for stroke presenting with a possible small ischemic subcortical infarction.  Stroke Risk Factors - family history, hypertension and smoking  Plan: 1. HgbA1c, fasting lipid panel 2. MRI, MRA  of the brain without contrast 3. PT consult, OT consult, Speech consult 4. Echocardiogram 5. Carotid dopplers 6. Prophylactic therapy-Antiplatelet med: Aspirin  7. Risk factor modification 8. Telemetry monitoring  C.R. Nicole Kindred, MD [facility] [phone]  10/28/2015, 11:03 PM

## 2015-11-06 ENCOUNTER — Ambulatory Visit (INDEPENDENT_AMBULATORY_CARE_PROVIDER_SITE_OTHER): Payer: Self-pay | Admitting: Family Medicine

## 2015-11-06 ENCOUNTER — Encounter: Payer: Self-pay | Admitting: Family Medicine

## 2015-11-06 VITALS — BP 140/88 | HR 64 | Temp 98.0°F | Wt 274.8 lb

## 2015-11-06 DIAGNOSIS — R4781 Slurred speech: Secondary | ICD-10-CM

## 2015-11-06 DIAGNOSIS — I639 Cerebral infarction, unspecified: Secondary | ICD-10-CM

## 2015-11-06 DIAGNOSIS — I429 Cardiomyopathy, unspecified: Secondary | ICD-10-CM

## 2015-11-06 DIAGNOSIS — R931 Abnormal findings on diagnostic imaging of heart and coronary circulation: Secondary | ICD-10-CM

## 2015-11-06 DIAGNOSIS — Z09 Encounter for follow-up examination after completed treatment for conditions other than malignant neoplasm: Secondary | ICD-10-CM

## 2015-11-06 DIAGNOSIS — R2990 Unspecified symptoms and signs involving the nervous system: Secondary | ICD-10-CM

## 2015-11-06 DIAGNOSIS — R269 Unspecified abnormalities of gait and mobility: Secondary | ICD-10-CM

## 2015-11-06 MED ORDER — PROPRANOLOL HCL 10 MG PO TABS: ORAL_TABLET | ORAL | Status: DC

## 2015-11-06 NOTE — Assessment & Plan Note (Signed)
Encouraged statin use. See above.  Change back to 325 mg ASA as he had only been on 81mg  in meantime since discharge.   Risk factor reduction d/w pt, he isn't smoking, needs continued weight reduction.   Okay for outpatient fu.  No residual. Has f/u with neuro pending.

## 2015-11-06 NOTE — Patient Instructions (Signed)
Start taking 325mg  aspirin a day.  Colton [name] will call about your referral for cardiology. See her on the way out.  Ask your girlfriend about any snoring.  If you are snoring or pausing with breathing at night then let me know.   Keep the appointment with neurology.  If you have any more symptoms, then go back to the ER immediately.  Think about trying at least a half dose of lipitor in the meantime.  If you have aches, then stop it.  Keep working on your weight in the meantime.  Take care.  Glad to see you.

## 2015-11-06 NOTE — Progress Notes (Signed)
Pre visit review using our clinic review tool, if applicable. No additional management support is needed unless otherwise documented below in the visit note.  Date of Admission: 10/28/2015 Date of Discharge: 10/31/2015  Attending Physician: Marvel Plan, MD, Stroke MD Patient's PCP: Crawford Givens, MD  DISCHARGE DIAGNOSIS:  Principal Problem:  Stroke-like episode [facility]) s/p IV tPA Active Problems:  HTN (hypertension)  Gout attack  Cardiomyopathy (HCC)  Hyperlipidemia  Tobacco use disorder  Obesity  BMI: Body mass index is 39.22 kg/(m^2).  Past Medical History  Diagnosis Date  . Gout 07/2001    [facility]  . Discoid lupus    Past Surgical History  Procedure Laterality Date  . Cardiolite  08/16/2008    [facility]      Medication List    TAKE these medications       aspirin 325 MG tablet  Take 1 tablet (325 mg total) by mouth daily.     atorvastatin 20 MG tablet  Commonly known as: LIPITOR  Take 1 tablet (20 mg total) by mouth daily at 6 PM.     colchicine 0.6 MG tablet  Take 1 tablet (0.6 mg total) by mouth daily as needed (gout). Take up to tid for the next couple of days for pain relief post hospitalization     indomethacin 50 MG capsule  Commonly known as: INDOCIN  Take 1 capsule (50 mg total) by mouth 2 (two) times daily with a meal. As needed for gout     nicotine 21 mg/24hr patch  Commonly known as: NICODERM CQ - dosed in mg/24 hours  Place 21 mg onto the skin daily.     propranolol 10 MG tablet  Commonly known as: INDERAL  TAKE 1 AND 1/2 TABLETS(15 MG) BY MOUTH TWICE DAILY     VISINE OP  Place 1 drop into both eyes as needed (for dry eyes).        LABORATORY STUDIES CBC  Labs (Brief)       Component Value Date/Time   WBC 8.8 10/28/2015 1759   RBC 4.96 10/28/2015 1759   HGB 14.7 10/28/2015 1759   HCT 43.1 10/28/2015 1759   PLT 110*  10/28/2015 1759   MCV 86.9 10/28/2015 1759   MCH 29.7 10/28/2015 1759   MCHC 34.2 10/28/2015 1759   RDW 14.1 10/28/2015 1759   LYMPHSABS 1.9 10/28/2015 1759   MONOABS 0.7 10/28/2015 1759   EOSABS 0.7 10/28/2015 1759   BASOSABS 0.1 10/28/2015 1759     CMP  Labs (Brief)       Component Value Date/Time   NA 138 10/28/2015 1759   K 4.1 10/28/2015 1759   CL 106 10/28/2015 1759   CO2 24 10/28/2015 1759   GLUCOSE 113* 10/28/2015 1759   BUN 18 10/28/2015 1759   CREATININE 1.32* 10/28/2015 1759   CALCIUM 9.1 10/28/2015 1759   PROT 7.3 10/28/2015 1759   ALBUMIN 4.3 10/28/2015 1759   AST 23 10/28/2015 1759   ALT 20 10/28/2015 1759   ALKPHOS 61 10/28/2015 1759   BILITOT 0.7 10/28/2015 1759   GFRNONAA 57* 10/28/2015 1759   GFRAA >60 10/28/2015 1759     COAGS  Recent Labs    Lab Results  Component Value Date   INR 1.13 10/28/2015   INR 1.04 11/17/2010     Lipid Panel  Labs (Brief)       Component Value Date/Time   CHOL 167 10/29/2015 0342   TRIG 303* 10/29/2015 0342   HDL 24* 10/29/2015 0342  CHOLHDL 7.0 10/29/2015 0342   VLDL 61* 10/29/2015 0342   LDLCALC 82 10/29/2015 0342     HgbA1C   Recent Labs    Lab Results  Component Value Date   HGBA1C 6.2* 10/29/2015     Cardiac Panel (last 3 results)   Recent Labs (last 2 labs)      Recent Labs  10/28/15 1759  TROPONINI <0.03     Urine Drug Screen   Labs (Brief)       Component Value Date/Time   LABOPIA POSITIVE* 10/29/2015 1854   COCAINSCRNUR NONE DETECTED 10/29/2015 1854   LABBENZ NONE DETECTED 10/29/2015 1854   AMPHETMU NONE DETECTED 10/29/2015 1854   THCU NONE DETECTED 10/29/2015 1854   LABBARB NONE DETECTED 10/29/2015 1854      SIGNIFICANT DIAGNOSTIC STUDIES Ct Head Wo Contrast 10/28/2015  No evidence of acute  intracranial abnormality. Probable chronic small-vessel white matter ischemic changes.   Dg Chest Port 1 View 10/29/2015  1. No acute findings.  2. Cardiac enlargement.   Mri & Mra brain w/o Contrast 10/29/2015  1. Intermittently motion and artifact degraded exam with no acute intracranial abnormality identified. 2. Patchy cerebral white matter signal changes and small chronic micro hemorrhages in the left cerebral and both cerebellar hemispheres. These are nonspecific but favor related to chronic small vessel disease. 3. Intracranial MRA is degraded by artifact in key areas of the study, but there is no strong evidence of high-grade intracranial large vessel stenosis or occlusion.   Carotid Ultrasound - There is no obvious evidence of hemodynamically significant internal carotid artery stenosis bilaterally. Vertebral arteries are patent with antegrade flow.  2D Echocardiogram - Left ventricle: The cavity size was moderately dilated. Wall thickness was normal. Systolic function was moderately reduced. The estimated ejection fraction was in the range of 35% to 40%. Features are consistent with a pseudonormal left ventricular filling pattern, with concomitant abnormal relaxation and increased filling pressure (grade 2 diastolic dysfunction).    HISTORY OF PRESENT ILLNESS Colton [name]. is an 60 y.o. male with a history of hypertension, gout and discoid lupus, presented to the ED at [facility] following acute onset of slurred speech and weakness of lower extremities. Patient was last known well at 4:00 PM on 10/28/2015. He has no previous history of stroke nor TIA. CT scan of his head showed no acute intracranial abnormality. He was evaluated by telemetry neurology and deemed to be a candidate for IV TPA which was administered. Patient's deficits resolved subsequently. There were no apparent complications. He was transferred to [facility] for further management. NIH stroke score at the time of this  evaluation was 0.    HOSPITAL COURSE [name]. is a 60 y.o. male with history of hypertension, gout, and discoid lupus presenting with speech difficulties and bilateral lower extremity weakness. He received IV t-PA due to speech difficulties at [facility] and was transferred to [facility]. He tolerated TPA without difficulty. MRI neg for acute stroke; not felt to have TIA. Problem of significance seemed to be bilateral LE gout flare. He was started on routine colchicine. Incidental EF of 35-40% was noted. Patient has cardiologist out of town he wanted to follow up with. Discharged home with follow up with Dr. Pearlean Brownie.  Bilateral great toe gout - likely the reason for presenting b/l LE mild weakness   Resolved after 1 hour of tPA  Hx of gout wit frequent flair  On colchicine at home  Resume home meds  Uric acid 8.7 mildly elevated  Pain much improved  Stroke like symptom s/p IV tPA - No stroke. Doubt TIA  Resultant deficit resolved  MRI No acute stroke. small vessel disease  MRA No significant stenosis  Carotid Doppler unremarkable  2D Echo EF 35-40%. No source of embolus.  LDL 82  HgbA1c 6.2  No antithrombotic prior to admission, now on aspirin 325 mg daily. Continue at discharge  Patient counseled to be compliant with his antithrombotic medications  Ongoing aggressive stroke risk factor management  Therapy recommendations: no therapy needs  Disposition: return home  Cardiomyopathy  2D Echo EF 35-40%. No source of embolus.  Patient has cardiologist who he wants to follow up with as an OP  Hypertension  Stable  BP goal normotensive  Hyperlipidemia  Home meds: No lipid lowering medications prior to admission.  LDL 82  Added low-dose Lipitor 20 mg daily  Continue statin at discharge  Tobacco abuse  Current smoker  Smoking cessation counseling provided  Pt is willing to quit  Other Stroke Risk Factors  Advanced age  ETOH  use  UDS positive for opiates  Obesity, Body mass index is 39.22 kg/(m^2).   Possible obstructive sleep apnea - outpatient evaluation  Other Active Problems  Mild thrombocytopenia - 110  Mildly elevated creatinine - 1.32   DISCHARGE EXAM Blood pressure 145/101, pulse 85, temperature 98.7 F (37.1 C), temperature source Oral, resp. rate 22, height 5\' 11"  (1.803 m), weight 127.5 kg (281 lb 1.4 oz), SpO2 98 %. Pleasant obese middle aged Caucasian male not in distress. . Afebrile. Head is nontraumatic. Neck is supple without bruit. Cardiac exam no murmur or gallop. Lungs are clear to auscultation. Distal pulses are well felt. Bilateral great toe redness, tenderness on palpation.  Neurological Exam:  Awake Alert oriented x 3. Normal speech and language. PERRL, eye movements full without nystagmus. fundi were not visualized. Vision acuity and fields appear normal. Hearing is normal. Facial symmetrical. Palatal movements are normal. Face symmetric. Tongue midline. Normal strength, tone, reflexes and coordination. Normal sensation. Gait deferred.  Discharge Diet Diet Heart Room service appropriate?: Yes; Fluid consistency:: Thin liquids  DISCHARGE PLAN  Disposition: home   Given low EF, continue aspirin 325 mg daily   Follow-up Crawford Givens, MD in 2 weeks.  Follow-up with Dr. Delia Heady, Stroke Clinic in 2 months, office to schedule an appointment.  Follow-up with cardiologist related to low EF  Recommend OP sleep evaluation for obstructive sleep apnea     The above was d/w pt.    He noted slurred speech, gait changes, to ER, CT neg and given TPA, with resolution of sx.  No sx now.  No weakness.  No gait changes.   Low EF d/w pt and cards referral.    Needs cards referral re: low EF, presumed to be from h/o HTN.  D/w pt about possible OSA.  He doesn't wake up from snoring and he'll check with his girlfriend about his possible snoring.   He isn't SOB or having CP.    He stopped smoking.  He is working on his weight with diet and exercise.  He was concerned about statin use, father with heart attack 2 days after starting statin and mother had aches on lipitor.  D/w pt.  He has not been on statin.   PMH and SH reviewed  ROS: Per HPI unless specifically indicated in ROS section   Meds, vitals, and allergies reviewed.   GEN: nad, alert and oriented HEENT: mucous membranes moist NECK: supple w/o  LA CV: rrr PULM: ctab, no inc wob ABD: soft, +bs EXT: no edema SKIN: no acute rash CN 2-12 wnl B, S/S/DTR wnl x4

## 2015-11-13 NOTE — Telephone Encounter (Signed)
Please close encounter if completed

## 2015-12-18 ENCOUNTER — Ambulatory Visit (INDEPENDENT_AMBULATORY_CARE_PROVIDER_SITE_OTHER): Payer: Self-pay | Admitting: Family Medicine

## 2015-12-18 ENCOUNTER — Encounter: Payer: Self-pay | Admitting: Family Medicine

## 2015-12-18 VITALS — BP 142/94 | HR 65 | Temp 97.6°F | Wt 280.0 lb

## 2015-12-18 DIAGNOSIS — I639 Cerebral infarction, unspecified: Secondary | ICD-10-CM

## 2015-12-18 DIAGNOSIS — L719 Rosacea, unspecified: Secondary | ICD-10-CM

## 2015-12-18 DIAGNOSIS — R5383 Other fatigue: Secondary | ICD-10-CM

## 2015-12-18 DIAGNOSIS — Z8349 Family history of other endocrine, nutritional and metabolic diseases: Secondary | ICD-10-CM

## 2015-12-18 DIAGNOSIS — I429 Cardiomyopathy, unspecified: Secondary | ICD-10-CM

## 2015-12-18 DIAGNOSIS — R2990 Unspecified symptoms and signs involving the nervous system: Secondary | ICD-10-CM

## 2015-12-18 LAB — TSH: TSH: 0.93 u[IU]/mL (ref 0.35–4.50)

## 2015-12-18 LAB — VITAMIN B12: Vitamin B-12: 338 pg/mL (ref 211–911)

## 2015-12-18 MED ORDER — PROPRANOLOL HCL 10 MG PO TABS: 20.0000 mg | ORAL_TABLET | Freq: Three times a day (TID) | ORAL | 12 refills | Status: DC

## 2015-12-18 MED ORDER — CEPHALEXIN 500 MG PO CAPS
500.0000 mg | ORAL_CAPSULE | Freq: Two times a day (BID) | ORAL | 0 refills | Status: DC
Start: 2015-12-18 — End: 2016-02-06

## 2015-12-18 NOTE — Progress Notes (Signed)
Pre visit review using our clinic review tool, if applicable. No additional management support is needed unless otherwise documented below in the visit note.  H/o CVA, treated with TPA.  D/w pt.   He has f/u with neuro pending.  In the meantime, still off statin so that isn't causative.  Encouraged statin trial.    He has eval with cards pending.   H/o dec EF noted.  No swelling, not SOB, no sx of sleep apnea.    Fatigue.  High stress with work.  He has noted some flushing and need to enunciate more when in high stress situations but no slurred speech.  H/o B12 def in the past.  His mood is still good. FH thyroid disease (mother) noted.  B posterior thigh pain.  When laying down and sitting but not with/worse with walking.    H/o rosacea, would take keflex prn for a flare, usually 10 day course if needed.    PMH and SH reviewed  ROS: Per HPI unless specifically indicated in ROS section   Meds, vitals, and allergies reviewed.   GEN: nad, alert and oriented HEENT: mucous membranes moist NECK: supple w/o LA CV: rrr.  PULM: ctab, no inc wob ABD: soft, +bs EXT: no edema SKIN: no acute rash CN 2-12 wnl B, S/S/DTR wnl x4 Calf 43cm B, w/o leg tenderness or varicosities noted.

## 2015-12-18 NOTE — Patient Instructions (Signed)
Go to the lab on the way out.  We'll contact you with your lab report. Keep the appointment with cardiology and neurology.  I would try taking the lipitor.  Take care.  Glad to see you.

## 2015-12-19 DIAGNOSIS — L719 Rosacea, unspecified: Secondary | ICD-10-CM | POA: Insufficient documentation

## 2015-12-19 DIAGNOSIS — R5383 Other fatigue: Secondary | ICD-10-CM | POA: Insufficient documentation

## 2015-12-19 NOTE — Assessment & Plan Note (Signed)
Needs cards f/u, encouraged statin use and weight loss.

## 2015-12-19 NOTE — Assessment & Plan Note (Signed)
Check tsh and b12.  Needs weight loss.  May need OSA eval, will defer to cards with pending OV re: cardiomyopathy.  Unclear how much of this is job related or due to upheaval from recent CVA. D/w pt.  >25 minutes spent in face to face time with patient, >50% spent in counselling or coordination of care.

## 2015-12-19 NOTE — Assessment & Plan Note (Signed)
Can use keflex prn

## 2015-12-19 NOTE — Assessment & Plan Note (Signed)
Unclear if the need to enunciate more clearly is related to prev CVA.  He doesn't have slurred speech now and neuro exam is nonfocal.  D/w pt about secondary prevention, ie statin trial.  Will await neuro input.

## 2015-12-20 ENCOUNTER — Encounter: Payer: Self-pay | Admitting: *Deleted

## 2015-12-28 ENCOUNTER — Ambulatory Visit: Payer: Self-pay | Admitting: Cardiology

## 2015-12-28 ENCOUNTER — Encounter: Payer: Self-pay | Admitting: Neurology

## 2015-12-28 ENCOUNTER — Ambulatory Visit (INDEPENDENT_AMBULATORY_CARE_PROVIDER_SITE_OTHER): Payer: Self-pay | Admitting: Neurology

## 2015-12-28 VITALS — BP 159/106 | HR 67 | Ht 72.0 in | Wt 285.6 lb

## 2015-12-28 DIAGNOSIS — R2990 Unspecified symptoms and signs involving the nervous system: Secondary | ICD-10-CM

## 2015-12-28 DIAGNOSIS — I639 Cerebral infarction, unspecified: Secondary | ICD-10-CM

## 2015-12-28 NOTE — Patient Instructions (Signed)
I had a long d/w patient about his recent stroke like episode, risk for recurrent stroke/TIAs, personally independently reviewed imaging studies and stroke evaluation results and answered questions.Continue aspirin 325 mg daily  for secondary stroke prevention and maintain strict control of hypertension with blood pressure goal below 130/90, diabetes with hemoglobin A1c goal below 6.5% and lipids with LDL cholesterol goal below 70 mg/dL. the patient has discontinued statin due to fear of side effects but should be able to bring his LDL cholesterol down with diet and weight loss. I also advised the patient to eat a healthy diet with plenty of whole grains, cereals, fruits and vegetables, exercise regularly and maintain ideal body weight .I complimented the patient on his smoking cessation and encouraged him to go on a low-fat diet and steadily lose weight. I also advised him to participate in meditation or yoga for stress laxation since stress may have precipitated his episode. Followup in the future with me in the future only as necessary.  Stroke Prevention Some medical conditions and behaviors are associated with an increased chance of having a stroke. You may prevent a stroke by making healthy choices and managing medical conditions. HOW CAN I REDUCE MY RISK OF HAVING A STROKE?   Stay physically active. Get at least 30 minutes of activity on most or all days.  Do not smoke. It may also be helpful to avoid exposure to secondhand smoke.  Limit alcohol use. Moderate alcohol use is considered to be:  No more than 2 drinks per day for men.  No more than 1 drink per day for nonpregnant women.  Eat healthy foods. This involves:  Eating 5 or more servings of fruits and vegetables a day.  Making dietary changes that address high blood pressure (hypertension), high cholesterol, diabetes, or obesity.  Manage your cholesterol levels.  Making food choices that are high in fiber and low in saturated  fat, trans fat, and cholesterol may control cholesterol levels.  Take any prescribed medicines to control cholesterol as directed by your health care provider.  Manage your diabetes.  Controlling your carbohydrate and sugar intake is recommended to manage diabetes.  Take any prescribed medicines to control diabetes as directed by your health care provider.  Control your hypertension.  Making food choices that are low in salt (sodium), saturated fat, trans fat, and cholesterol is recommended to manage hypertension.  Ask your health care provider if you need treatment to lower your blood pressure. Take any prescribed medicines to control hypertension as directed by your health care provider.  If you are 26-43 years of age, have your blood pressure checked every 3-5 years. If you are 4 years of age or older, have your blood pressure checked every year.  Maintain a healthy weight.  Reducing calorie intake and making food choices that are low in sodium, saturated fat, trans fat, and cholesterol are recommended to manage weight.  Stop drug abuse.  Avoid taking birth control pills.  Talk to your health care provider about the risks of taking birth control pills if you are over 41 years old, smoke, get migraines, or have ever had a blood clot.  Get evaluated for sleep disorders (sleep apnea).  Talk to your health care provider about getting a sleep evaluation if you snore a lot or have excessive sleepiness.  Take medicines only as directed by your health care provider.  For some people, aspirin or blood thinners (anticoagulants) are helpful in reducing the risk of forming abnormal blood clots that  can lead to stroke. If you have the irregular heart rhythm of atrial fibrillation, you should be on a blood thinner unless there is a good reason you cannot take them.  Understand all your medicine instructions.  Make sure that other conditions (such as anemia or atherosclerosis) are  addressed. SEEK IMMEDIATE MEDICAL CARE IF:   You have sudden weakness or numbness of the face, arm, or leg, especially on one side of the body.  Your face or eyelid droops to one side.  You have sudden confusion.  You have trouble speaking (aphasia) or understanding.  You have sudden trouble seeing in one or both eyes.  You have sudden trouble walking.  You have dizziness.  You have a loss of balance or coordination.  You have a sudden, severe headache with no known cause.  You have new chest pain or an irregular heartbeat. Any of these symptoms may represent a serious problem that is an emergency. Do not wait to see if the symptoms will go away. Get medical help at once. Call your local emergency services (911 in [place] Do not drive yourself to the hospital.   This information is not intended to replace advice given to you by your health care provider. Make sure you discuss any questions you have with your health care provider.   Document Released: 06/20/2004 Document Revised: 06/03/2014 Document Reviewed: 11/13/2012 Elsevier Interactive Patient Education 2016 [employer]  Fat and Cholesterol Restricted Diet High levels of fat and cholesterol in your blood may lead to various health problems, such as diseases of the heart, blood vessels, gallbladder, liver, and pancreas. Fats are concentrated sources of energy that come in various forms. Certain types of fat, including saturated fat, may be harmful in excess. Cholesterol is a substance needed by your body in small amounts. Your body makes all the cholesterol it needs. Excess cholesterol comes from the food you eat. When you have high levels of cholesterol and saturated fat in your blood, health problems can develop because the excess fat and cholesterol will gather along the walls of your blood vessels, causing them to narrow. Choosing the right foods will help you control your intake of fat and cholesterol. This will help keep the  levels of these substances in your blood within normal limits and reduce your risk of disease. WHAT IS MY PLAN? Your health care provider recommends that you:  Get no more than __________ % of the total calories in your daily diet from fat.  Limit your intake of saturated fat to less than ______% of your total calories each day.  Limit the amount of cholesterol in your diet to less than _________mg per day. WHAT TYPES OF FAT SHOULD I CHOOSE?  Choose healthy fats more often. Choose monounsaturated and polyunsaturated fats, such as olive and canola oil, flaxseeds, walnuts, almonds, and seeds.  Eat more omega-3 fats. Good choices include salmon, mackerel, sardines, tuna, flaxseed oil, and ground flaxseeds. Aim to eat fish at least two times a week.  Limit saturated fats. Saturated fats are primarily found in animal products, such as meats, butter, and cream. Plant sources of saturated fats include palm oil, palm kernel oil, and coconut oil.  Avoid foods with partially hydrogenated oils in them. These contain trans fats. Examples of foods that contain trans fats are stick margarine, some tub margarines, cookies, crackers, and other baked goods. WHAT GENERAL GUIDELINES DO I NEED TO FOLLOW? These guidelines for healthy eating will help you control your intake of fat and  cholesterol:  Check food labels carefully to identify foods with trans fats or high amounts of saturated fat.  Fill one half of your plate with vegetables and green salads.  Fill one fourth of your plate with whole grains. Look for the word "whole" as the first word in the ingredient list.  Fill one fourth of your plate with lean protein foods.  Limit fruit to two servings a day. Choose fruit instead of juice.  Eat more foods that contain soluble fiber. Examples of foods that contain this type of fiber are apples, broccoli, carrots, beans, peas, and barley. Aim to get 20-30 g of fiber per day.  Eat more home-cooked food and  less restaurant, buffet, and fast food.  Limit or avoid alcohol.  Limit foods high in starch and sugar.  Limit fried foods.  Cook foods using methods other than frying. Baking, boiling, grilling, and broiling are all great options.  Lose weight if you are overweight. Losing just 5-10% of your initial body weight can help your overall health and prevent diseases such as diabetes and heart disease. WHAT FOODS CAN I EAT? Grains Whole grains, such as whole wheat or whole grain breads, crackers, cereals, and pasta. Unsweetened oatmeal, bulgur, barley, quinoa, or brown rice. Corn or whole wheat flour tortillas. Vegetables Fresh or frozen vegetables (raw, steamed, roasted, or grilled). Green salads. Fruits All fresh, canned (in natural juice), or frozen fruits. Meat and Other Protein Products Ground beef (85% or leaner), grass-fed beef, or beef trimmed of fat. Skinless chicken or [place]. Ground chicken or [place]. Pork trimmed of fat. All fish and seafood. Eggs. Dried beans, peas, or lentils. Unsalted nuts or seeds. Unsalted canned or dry beans. Dairy Low-fat dairy products, such as skim or 1% milk, 2% or reduced-fat cheeses, low-fat ricotta or cottage cheese, or plain low-fat yogurt. Fats and Oils Tub margarines without trans fats. Light or reduced-fat mayonnaise and salad dressings. Avocado. Olive, canola, sesame, or safflower oils. Natural peanut or almond butter (choose ones without added sugar and oil). The items listed above may not be a complete list of recommended foods or beverages. Contact your dietitian for more options. WHAT FOODS ARE NOT RECOMMENDED? Grains White bread. White pasta. White rice. Cornbread. Bagels, pastries, and croissants. Crackers that contain trans fat. Vegetables White potatoes. Corn. Creamed or fried vegetables. Vegetables in a cheese sauce. Fruits Dried fruits. Canned fruit in light or heavy syrup. Fruit juice. Meat and Other Protein Products Fatty cuts of  meat. Ribs, chicken wings, bacon, sausage, bologna, salami, chitterlings, fatback, hot dogs, bratwurst, and packaged luncheon meats. Liver and organ meats. Dairy Whole or 2% milk, cream, half-and-half, and cream cheese. Whole milk cheeses. Whole-fat or sweetened yogurt. Full-fat cheeses. Nondairy creamers and whipped toppings. Processed cheese, cheese spreads, or cheese curds. Sweets and Desserts Corn syrup, sugars, honey, and molasses. Candy. Jam and jelly. Syrup. Sweetened cereals. Cookies, pies, cakes, donuts, muffins, and ice cream. Fats and Oils Butter, stick margarine, lard, shortening, ghee, or bacon fat. Coconut, palm kernel, or palm oils. Beverages Alcohol. Sweetened drinks (such as sodas, lemonade, and fruit drinks or punches). The items listed above may not be a complete list of foods and beverages to avoid. Contact your dietitian for more information.   This information is not intended to replace advice given to you by your health care provider. Make sure you discuss any questions you have with your health care provider.   Document Released: 05/13/2005 Document Revised: 06/03/2014 Document Reviewed: 08/11/2013 Elsevier Interactive Patient Education  2016 [employer].

## 2015-12-29 NOTE — Progress Notes (Signed)
[facility] [address] [place]. [place] [address] [phone]       OFFICE FOLLOW-UP NOTE  [name]. Date of Birth:  [birth year] Medical Record Number:  [id number]   HPI: Mr Colton [name] is a 60 year Caucasian male seen for first office follow-up visit following hospital admission for stroke like symptoms in June 2017.Colton [name]. is an 60 y.o. male with a history of hypertension, gout and discoid lupus, presented to the ED at [facility] following acute onset of slurred speech and weakness of lower extremities. Patient was last known well at 4:00 PM on 10/28/2015. He has no previous history of stroke nor TIA. CT scan of his head showed no acute intracranial abnormality. He was evaluated by telemetry neurology and deemed to be a candidate for IV TPA which was administered. Patient's deficits resolved subsequently. There were no apparent complications. He was transferred to [facility] for further management. NIH stroke score at the time of this evaluation was 0. CT scan of the head on admission showed no acute abnormality and MRI scan showed artifactual due to motion degraded exam but no definite acute infarct are noted. There are mild changes of small vessel disease. Intracranial MRA is also degraded by motion but no large vessel stenosis was noted. Carotid ultrasound showed no significant extracranial stenosis. Transthoracic echo showed decreased ejection fraction of 35-40%. LDL cholesterol was slightly elevated at 82 mg percent and hemoglobin A1c was borderline at 6.2. Patient was started on aspirin and Lipitor 20 mg daily. Patient had a flareup of acute gout involving his great toe in the hospitalization for which was started on colchicine and allopurinol. Patient was counseled to quit smoking which he says his done completely. He states his symptoms recurred completely. Patient had a family history of bad reaction to statins in his mother and dad and hence he discontinued  Lipitor . He however plans to diet and exercise andweight. His gout flareup etc. down. Patient states his bring quite well but only occasionally has some word finding difficulties and increased alkaline phosphatase. He has some intermittent numbness in the right face which is triggered by stress. He admits that he has a very stressful job and he trades crude oil for large shipments and has been under a lot of stress in recent months due to falling prices upgoing. ROS:   14 system review of systems is positive for  ringing in the ears, speech difficulty, weakness, muscle aches and all other systems negative PMH:  Past Medical History:  Diagnosis Date  . Depressed left ventricular ejection fraction   . Discoid lupus   . Former smoker   . Gout 07/2001   [facility]  . Hyperlipidemia   . Rosacea   . Stroke [facility])    s/p tpa 2017 w/o residual defecit    Social History:  Social History   Social History  . Marital status: Single    Spouse name: N/A  . Number of children: 0  . Years of education: N/A   Occupational History  . Property Patents/Inventions/Patents    Social History Main Topics  . Smoking status: Former Smoker    Packs/day: 2.00    Years: 30.00    Types: Cigarettes  . Smokeless tobacco: Never Used  . Alcohol use 1.2 oz/week    1 Glasses of wine, 1 Shots of liquor per week     Comment: occasionally  . Drug use: No  . Sexual activity: Not on file   Other Topics  Concern  . Not on file   Social History Narrative   [employer] '78-'79   Prev investment banking, now trades [employer]   Widowed, wife died of mesothelioma   No kids    Medications:   Current Outpatient Prescriptions on File Prior to Visit  Medication Sig Dispense Refill  . aspirin 325 MG tablet Take 1 tablet (325 mg total) by mouth daily. 30 tablet 2  . cephALEXin (KEFLEX) 500 MG capsule Take 1 capsule (500 mg total) by mouth 2 (two) times daily. Take for 10 days as needed for rosacea.   Disp 2 courses 40 capsule 0  . colchicine 0.6 MG tablet Take 1 tablet (0.6 mg total) by mouth daily as needed (gout). Take up to tid for the next couple of days for pain relief post hospitalization 30 tablet 0  . indomethacin (INDOCIN) 50 MG capsule Take 1 capsule (50 mg total) by mouth 2 (two) times daily with a meal. As needed for gout 60 capsule 0  . propranolol (INDERAL) 10 MG tablet Take 2 tablets (20 mg total) by mouth 3 (three) times daily. 180 tablet 12  . Tetrahydrozoline HCl (VISINE OP) Place 1 drop into both eyes as needed (for dry eyes).     No current facility-administered medications on file prior to visit.     Allergies:   Allergies  Allergen Reactions  . Corticosteroids Other (See Comments)    Muscle spasms  . Latex Rash    Physical Exam General: Mildly obese middle-age Caucasian male, seated, in no evident distress Head: head normocephalic and atraumatic.  Neck: supple with no carotid or supraclavicular bruits Cardiovascular: regular rate and rhythm, no murmurs Musculoskeletal: no deformity Skin:  no rash/petichiae Vascular:  Normal pulses all extremities Vitals:   12/28/15 1033  BP: (!) 159/106  Pulse: 67   Neurologic Exam Mental Status: Awake and fully alert. Oriented to place and time. Recent and remote memory intact. Attention span, concentration and fund of knowledge appropriate. Mood and affect appropriate.  Cranial Nerves: Fundoscopic exam reveals sharp disc margins. Pupils equal, briskly reactive to light. Extraocular movements full without nystagmus. Visual fields full to confrontation. Hearing intact. Facial sensation intact. Face, tongue, palate moves normally and symmetrically.  Motor: Normal bulk and tone. Normal strength in all tested extremity muscles. Sensory.: intact to touch ,pinprick .position and vibratory sensation.  Coordination: Rapid alternating movements normal in all extremities. Finger-to-nose and heel-to-shin performed accurately  bilaterally. Gait and Station: Arises from chair without difficulty. Stance is normal. Gait demonstrates normal stride length and balance . Able to heel, toe and tandem walk without difficulty.  Reflexes: 1+ and symmetric. Toes downgoing.   NIHSS  0 Modified Rankin 1   ASSESSMENT: 60 year old Caucasian male with strokelike episode in June 2017 treated with IV TPA with full recovery and MRI showing no definite infarct. Vascular risk factors of obesity, hypertension hyperlipidemia    PLAN: I had a long d/w patient about his recent stroke like episode, risk for recurrent stroke/TIAs, personally independently reviewed imaging studies and stroke evaluation results and answered questions.Continue aspirin 325 mg daily  for secondary stroke prevention and maintain strict control of hypertension with blood pressure goal below 130/90, diabetes with hemoglobin A1c goal below 6.5% and lipids with LDL cholesterol goal below 70 mg/dL. the patient has discontinued statin due to fear of side effects but should be able to bring his LDL cholesterol down with diet and weight loss. I also advised the patient to eat a healthy diet  with plenty of whole grains, cereals, fruits and vegetables, exercise regularly and maintain ideal body weight .I complimented the patient on his smoking cessation and encouraged him to go on a low-fat diet and steadily lose weight. I also advised him to participate in meditation or yoga for stress laxation since stress may have precipitated his episode. Followup in the future with me in the future only as necessary. Greater than 50% of time during this 25 minute visit was spent on counseling,explanation of diagnosis, planning of further management, discussion with patient and family and coordination of care Delia Heady, MD  [facility] [address] [place], [place] [address]  Phone [phone] Fax [phone] Note: This document was prepared with  digital dictation and possible smart phrase technology. Any transcriptional errors that result from this process are unintentional

## 2016-01-08 NOTE — Progress Notes (Signed)
Cardiology Office Note    Date:  01/09/2016   ID:  Colton BorsVictor Proby Jr., DOB Aug 23, 1955, MRN [id number]  PCP:  Crawford GivensGraham Duncan, MD  Cardiologist:  Armanda Magicraci Luchiano Viscomi, MD   Chief Complaint  Patient presents with  . Cardiomyopathy  . Hypertension    History of Present Illness:  Colton BorsVictor Schreffler Jr. is a 60 y.o. male with a history of DCM, remote tobacco use, hyperlipidemia, CVA and discoid lupus who presents today for evaluation of.  He suffered a strokelike episode in June 2017 treated with IV TPA and made full recovery with no CVA on MRI.  2D echo at that time showed LV dysfunction with EF 35-40% with elevated LDL at 82.  He was started on ASA and statin but stopped the statin due to fear of side effects.  He was seen by his PCP recently and was complaining of increased fatigue.  He says that he occasionally has a heavy feeling in his chest off and on for several years and even mentioned that he though it was angina.  He never told anyone.  It is located midsternal but sometimes in the axilla.  There is no associated diaphoresis, SOB or nausea with the pain.  He does have a remote history of tobacco use.  They are exertional and nonexertional but expecially with anxiety.  He occasionally will have some DOE with certain activities.  He denies any  LE edema, palpitations or syncope.  He has had problems with dizziness since his CVA.  He is now here for further evaluation of LV dysfunction.  There is no family history of DCM or sudden cardiac death.      Past Medical History:  Diagnosis Date  . Depressed left ventricular ejection fraction   . Discoid lupus   . Former smoker   . [facility]  . Hyperlipidemia   . Rosacea   . Stroke [facility](HCC)    s/p tpa 2017 w/o residual defecit    Past Surgical History:  Procedure Laterality Date  . Cardiolite  08/16/2008   [facility]    Current Medications: Outpatient Medications Prior to Visit  Medication Sig Dispense Refill  .  aspirin 325 MG tablet Take 1 tablet (325 mg total) by mouth daily. 30 tablet 2  . cephALEXin (KEFLEX) 500 MG capsule Take 1 capsule (500 mg total) by mouth 2 (two) times daily. Take for 10 days as needed for rosacea.  Disp 2 courses 40 capsule 0  . colchicine 0.6 MG tablet Take 1 tablet (0.6 mg total) by mouth daily as needed (gout). Take up to tid for the next couple of days for pain relief post hospitalization 30 tablet 0  . indomethacin (INDOCIN) 50 MG capsule Take 1 capsule (50 mg total) by mouth 2 (two) times daily with a meal. As needed for gout 60 capsule 0  . propranolol (INDERAL) 10 MG tablet Take 2 tablets (20 mg total) by mouth 3 (three) times daily. 180 tablet 12  . Tetrahydrozoline HCl (VISINE OP) Place 1 drop into both eyes as needed (for dry eyes).     No facility-administered medications prior to visit.      Allergies:   Corticosteroids and Latex   Social History   Social History  . Marital status: Single    Spouse name: N/A  . Number of children: 0  . Years of education: N/A   Occupational History  . Property Patents/Inventions/Patents    Social History Main Topics  . Smoking  status: Former Smoker    Packs/day: 2.00    Years: 30.00    Types: Cigarettes  . Smokeless tobacco: Never Used  . Alcohol use 1.2 oz/week    1 Glasses of wine, 1 Shots of liquor per week     Comment: occasionally  . Drug use: No  . Sexual activity: Not Asked   Other Topics Concern  . 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   ROS:   Please see the history of present illness.    ROS All other systems reviewed and are negative.   PHYSICAL EXAM:   VS:  BP (!) 148/88   Pulse 70   Ht 6' (1.829 m)   Wt 285 lb 12.8 oz (129.6 kg)   SpO2 97%    BMI 38.76 kg/m    GEN: Well nourished, well developed, in no acute distress  HEENT: normal  Neck: no JVD, carotid bruits, or masses Cardiac: RRR; no murmurs, rubs, or gallops,no edema.  Intact distal pulses bilaterally.  Respiratory:  clear to auscultation bilaterally, normal work of breathing GI: soft, nontender, nondistended, + BS MS: no deformity or atrophy  Skin: warm and dry, no rash Neuro:  Alert and Oriented x 3, Strength and sensation are intact Psych: euthymic mood, full affect  Wt Readings from Last 3 Encounters:  01/09/16 285 lb 12.8 oz (129.6 kg)  12/28/15 285 lb 9.6 oz (129.5 kg)  12/18/15 280 lb (127 kg)      Studies/Labs Reviewed:   EKG:  EKG is not ordered today.   Recent Labs: 10/28/2015: ALT 20; BUN 18; Creatinine, Ser 1.32; Hemoglobin 14.7; Platelets 110; Potassium 4.1; Sodium 138 12/18/2015: TSH 0.93   Lipid Panel    Component Value Date/Time   CHOL 167 10/29/2015 0342   TRIG 303 (H) 10/29/2015 0342   HDL 24 (L) 10/29/2015 0342   CHOLHDL 7.0 10/29/2015 0342   VLDL 61 (H) 10/29/2015 0342   LDLCALC 82 10/29/2015 0342   LDLDIRECT 104.4 05/12/2008 1304    Additional studies/ records that were reviewed today include:  Office noted on EPIC from PCP    ASSESSMENT:    1. Cardiomyopathy (HCC)   2. Essential hypertension   3. FH: CAD (coronary artery disease)   4. Hyperlipidemia   5. Stroke-like episode (HCC) s/p IV tPA      PLAN:  In order of problems listed above:  1. DCM with EF 35-40% by recent echo done in setting of acute CVA.  He has no evidence of volume overload on exam.  He has not had any SOB or LE edema.  With history of HTN this could be hypertensive DCM but given family  History of CAD and remote tobacco use this could could also be due to underlying CAD.  Recommend nuclear stress test to rule out ischemia.  Change Inderal to Coreg 6.25mg  BID.  Add Losartan 25mg  daily as well as aldactone 12.5mg  daily.  Followup in 1 week with HTN clinic  to uptitrate CHF meds and followup with me in 1 month.  Will repeat echo after several months on maximum medical therapy.  2. HTN - BP controlled on BB but will change Inderal to Coreg. 3. Chest pain with family history of  CAD and remote tobacco use in the past as well as HTN.  -I will get a nuclear stress test to rule out ischemia 4. Hyperlipidemia - with recent CVA recommend starting statin but patient would like to try diet and exercise first.  His parents had very bad reactions to statins and he wants to avoid them at this time.  5. Recent CVA ? Etiology - I will get a 30 day heart monitor to rule out PAF.    Medication Adjustments/Labs and Tests Ordered: Current medicines are reviewed at length with the patient today.  Concerns regarding medicines are outlined above.  Medication changes, Labs and Tests ordered today are listed in the Patient Instructions below.  There are no Patient Instructions on file for this visit.   Signed, Armanda Magic, MD  01/09/2016 2:49 PM    [facility] [address] [place], [place], [place]  [address] Phone: [phone]; Fax: [phone]

## 2016-01-09 ENCOUNTER — Encounter: Payer: Self-pay | Admitting: Cardiology

## 2016-01-09 ENCOUNTER — Ambulatory Visit (INDEPENDENT_AMBULATORY_CARE_PROVIDER_SITE_OTHER): Payer: Self-pay | Admitting: Cardiology

## 2016-01-09 VITALS — BP 148/88 | HR 70 | Ht 72.0 in | Wt 285.8 lb

## 2016-01-09 DIAGNOSIS — R2990 Unspecified symptoms and signs involving the nervous system: Secondary | ICD-10-CM

## 2016-01-09 DIAGNOSIS — I10 Essential (primary) hypertension: Secondary | ICD-10-CM

## 2016-01-09 DIAGNOSIS — I429 Cardiomyopathy, unspecified: Secondary | ICD-10-CM

## 2016-01-09 DIAGNOSIS — I4891 Unspecified atrial fibrillation: Secondary | ICD-10-CM

## 2016-01-09 DIAGNOSIS — E785 Hyperlipidemia, unspecified: Secondary | ICD-10-CM

## 2016-01-09 DIAGNOSIS — Z8249 Family history of ischemic heart disease and other diseases of the circulatory system: Secondary | ICD-10-CM

## 2016-01-09 DIAGNOSIS — I639 Cerebral infarction, unspecified: Secondary | ICD-10-CM

## 2016-01-09 MED ORDER — CARVEDILOL 6.25 MG PO TABS: 6.2500 mg | ORAL_TABLET | Freq: Two times a day (BID) | ORAL | 11 refills | Status: DC

## 2016-01-09 MED ORDER — SPIRONOLACTONE 25 MG PO TABS: 12.5000 mg | ORAL_TABLET | Freq: Every day | ORAL | 11 refills | Status: DC

## 2016-01-09 MED ORDER — LOSARTAN POTASSIUM 25 MG PO TABS: 25.0000 mg | ORAL_TABLET | Freq: Every day | ORAL | 11 refills | Status: DC

## 2016-01-09 NOTE — Patient Instructions (Addendum)
Medication Instructions:  1) STOP INDERAL 2) START LOSARTAN 25 mg daily 3) START ALDACTONE 12.5 mg daily 4) START COREG 6.25 mg TWICE DAILY  Labwork: Your physician recommends that you return for lab work in ONE WEEK (at time of HTN Clinic visit): BMET  Testing/Procedures: Your physician has requested that you have a lexiscan myoview. For further information please visit [email or web address]. Please follow instruction sheet, as given.  Your physician has recommended that you wear an event monitor. Event monitors are medical devices that record the heart's electrical activity. Doctors most often us these monitors to diagnose arrhythmias. Arrhythmias are problems with the speed or rhythm of the heartbeat. The monitor is a small, portable device. You can wear one while you do your normal daily activities. This is usually used to diagnose what is causing palpitations/syncope (passing out).  Follow-Up: Your physician recommends that you schedule a follow-up appointment in ONE WEEK in the HTN Clinic.  Your physician recommends that you keep your follow-up appointment with [name] on 02/12/16 at 11:00 AM.  Any Other Special Instructions Will Be Listed Below (If Applicable).     If you need a refill on your cardiac medications before your next appointment, please call your pharmacy.

## 2016-01-15 ENCOUNTER — Telehealth: Payer: Self-pay | Admitting: Cardiology

## 2016-01-15 ENCOUNTER — Telehealth (HOSPITAL_COMMUNITY): Payer: Self-pay | Admitting: *Deleted

## 2016-01-15 NOTE — Telephone Encounter (Signed)
Attempted to contact patient in reference to upcoming appointment in nuclear. Patient had a lot of ? And wanted to speak with nurse in reference to his medications before his test. I informed Florentina AddisonKatie of this and she will contact patient.Kaylise Blakeley, Adelene IdlerCynthia W

## 2016-01-15 NOTE — Telephone Encounter (Signed)
New message    Patient calling wants to speak with nurse    Pt C/O medication issue:  1. Name of Medication: patient not a home does not have medication in front of him - patient states 3 new medication   2. How are you currently taking this medication (dosage and times per day)? Patient does not have medication in front of him.    3. Are you having a reaction (difficulty breathing--STAT)? No   4. What is your medication issue? Has some question for the nurse on upcoming appts / 3 new medication he receive from pharmacy.

## 2016-01-15 NOTE — Telephone Encounter (Signed)
Confirmed patient's medications picked up from pharmacy are correct. (Generic versus name brand was the issue). Rescheduled lab work and HTN Clinic OV to next week to reflect medication changes as patient will start day one of drug tomorrow. Patient was grateful for assistance.

## 2016-01-16 ENCOUNTER — Telehealth (HOSPITAL_COMMUNITY): Payer: Self-pay | Admitting: *Deleted

## 2016-01-16 NOTE — Telephone Encounter (Signed)
Patient given detailed instructions per Myocardial Perfusion Study Information Sheet for the test on 01/17/16 at 1230. Patient notified to arrive 15 minutes early and that it is imperative to arrive on time for appointment to keep from having the test rescheduled.  If you need to cancel or reschedule your appointment, please call the office within 24 hours of your appointment. Failure to do so may result in a cancellation of your appointment, and a $50 no show fee. Patient verbalized understanding.Sebastien Jackson, Adelene IdlerCynthia W

## 2016-01-17 ENCOUNTER — Other Ambulatory Visit: Payer: Self-pay

## 2016-01-17 ENCOUNTER — Ambulatory Visit (HOSPITAL_COMMUNITY): Payer: Self-pay | Attending: Cardiology

## 2016-01-17 ENCOUNTER — Ambulatory Visit: Payer: Self-pay

## 2016-01-17 DIAGNOSIS — I119 Hypertensive heart disease without heart failure: Secondary | ICD-10-CM | POA: Insufficient documentation

## 2016-01-17 DIAGNOSIS — R0609 Other forms of dyspnea: Secondary | ICD-10-CM | POA: Insufficient documentation

## 2016-01-17 DIAGNOSIS — R9439 Abnormal result of other cardiovascular function study: Secondary | ICD-10-CM | POA: Insufficient documentation

## 2016-01-17 DIAGNOSIS — R079 Chest pain, unspecified: Secondary | ICD-10-CM | POA: Insufficient documentation

## 2016-01-17 DIAGNOSIS — I429 Cardiomyopathy, unspecified: Secondary | ICD-10-CM | POA: Insufficient documentation

## 2016-01-17 DIAGNOSIS — Z8249 Family history of ischemic heart disease and other diseases of the circulatory system: Secondary | ICD-10-CM | POA: Insufficient documentation

## 2016-01-17 MED ORDER — REGADENOSON 0.4 MG/5ML IV SOLN
0.4000 mg | Freq: Once | INTRAVENOUS | Status: AC
  Administered 2016-01-17: 0.4 mg via INTRAVENOUS

## 2016-01-17 MED ORDER — TECHNETIUM TC 99M TETROFOSMIN IV KIT
32.3000 | PACK | Freq: Once | INTRAVENOUS | Status: AC | PRN
  Administered 2016-01-17: 32.3 via INTRAVENOUS
  Filled 2016-01-17: qty 32

## 2016-01-18 ENCOUNTER — Ambulatory Visit (HOSPITAL_COMMUNITY): Payer: Self-pay | Attending: Cardiology

## 2016-01-18 LAB — MYOCARDIAL PERFUSION IMAGING
CHL CUP RESTING HR STRESS: 75 {beats}/min
LVDIAVOL: 225 mL (ref 62–150)
LVSYSVOL: 157 mL
Peak HR: 100 {beats}/min
RATE: 0.32
SDS: 6
SRS: 6
SSS: 9
TID: 1.1

## 2016-01-18 MED ORDER — TECHNETIUM TC 99M TETROFOSMIN IV KIT
32.7000 | PACK | Freq: Once | INTRAVENOUS | Status: AC | PRN
  Administered 2016-01-18: 32.7 via INTRAVENOUS
  Filled 2016-01-18: qty 33

## 2016-01-22 NOTE — Progress Notes (Deleted)
Patient ID: Colton BorsVictor Duffus Jr.                 DOB: Oct 12, 1955                      MRN: [id number]     HPI: Colton BorsVictor Whalin Jr. is a 60 y.o. male referred by [name] to HTN clinic for HTN and heart failure medication optimization. He has hx of dilated cardiomyopathy with EF 35-40% in setting of acute CVA, HTN, CAD, HLD, AF and stroke-like episode s/p IV tPA. At his last OV with [name] last week, Inderal was switched to Coreg 6.25 mg BID, and losartan 25mg  daily and aldactone 12.5 mg daily were initiated. Today he presents to review his heart failure and hypertension medications changes.     Current HTN meds: carvedilol 6.25 mg BID, losartan 25 mg daily, spironolactone 12.5 mg daily Previously tried: propranolol  BP goal: <140/90 mmHg  Family History: Heart disease in his father and other Social History: Former smoker, with 60 pack-year history. Never used smokeless tobacco. Occasional alcohol use.   Diet:   Exercise:   Home BP readings:   Wt Readings from Last 3 Encounters:  01/17/16 285 lb (129.3 kg)  01/09/16 285 lb 12.8 oz (129.6 kg)  12/28/15 285 lb 9.6 oz (129.5 kg)   BP Readings from Last 3 Encounters:  01/09/16 (!) 148/88  12/28/15 (!) 159/106  12/18/15 (!) 142/94   Pulse Readings from Last 3 Encounters:  01/09/16 70  12/28/15 67  12/18/15 65    Renal function: CrCl cannot be calculated (Patient's most recent lab result is older than the maximum 21 days allowed.).  Past Medical History:  Diagnosis Date  . Depressed left ventricular ejection fraction   . Discoid lupus   . Former smoker   . [facility]  . Hyperlipidemia   . Rosacea   . Stroke [facility](HCC)    s/p tpa 2017 w/o residual defecit    Current Outpatient Prescriptions on File Prior to Visit  Medication Sig Dispense Refill  . aspirin 325 MG tablet Take 1 tablet (325 mg total) by mouth daily. 30 tablet 2  . carvedilol (COREG) 6.25 MG tablet Take 1 tablet (6.25 mg total) by mouth  2 (two) times daily. 60 tablet 11  . cephALEXin (KEFLEX) 500 MG capsule Take 1 capsule (500 mg total) by mouth 2 (two) times daily. Take for 10 days as needed for rosacea.  Disp 2 courses 40 capsule 0  . colchicine 0.6 MG tablet Take 1 tablet (0.6 mg total) by mouth daily as needed (gout). Take up to tid for the next couple of days for pain relief post hospitalization 30 tablet 0  . indomethacin (INDOCIN) 50 MG capsule Take 1 capsule (50 mg total) by mouth 2 (two) times daily with a meal. As needed for gout 60 capsule 0  . losartan (COZAAR) 25 MG tablet Take 1 tablet (25 mg total) by mouth daily. 30 tablet 11  . spironolactone (ALDACTONE) 25 MG tablet Take 0.5 tablets (12.5 mg total) by mouth daily. 15 tablet 11  . Tetrahydrozoline HCl (VISINE OP) Place 1 drop into both eyes as needed (for dry eyes).     No current facility-administered medications on file prior to visit.     Allergies  Allergen Reactions  . Corticosteroids Other (See Comments)    Muscle spasms  . Latex Rash     Assessment/Plan:  1. Heart Failure:  2. Hypertension:

## 2016-01-23 ENCOUNTER — Ambulatory Visit (INDEPENDENT_AMBULATORY_CARE_PROVIDER_SITE_OTHER): Payer: Self-pay | Admitting: Pharmacist

## 2016-01-23 ENCOUNTER — Other Ambulatory Visit: Payer: Self-pay | Admitting: *Deleted

## 2016-01-23 VITALS — BP 158/100 | HR 77 | Wt 287.5 lb

## 2016-01-23 DIAGNOSIS — I429 Cardiomyopathy, unspecified: Secondary | ICD-10-CM

## 2016-01-23 DIAGNOSIS — I10 Essential (primary) hypertension: Secondary | ICD-10-CM

## 2016-01-23 LAB — BASIC METABOLIC PANEL
BUN: 21 mg/dL (ref 7–25)
CALCIUM: 9 mg/dL (ref 8.6–10.3)
CO2: 24 mmol/L (ref 20–31)
CREATININE: 1.17 mg/dL (ref 0.70–1.25)
Chloride: 105 mmol/L (ref 98–110)
GLUCOSE: 118 mg/dL — AB (ref 65–99)
Potassium: 4.6 mmol/L (ref 3.5–5.3)
Sodium: 137 mmol/L (ref 135–146)

## 2016-01-23 MED ORDER — CARVEDILOL 12.5 MG PO TABS: 12.5000 mg | ORAL_TABLET | Freq: Two times a day (BID) | ORAL | 11 refills | Status: DC

## 2016-01-23 MED ORDER — LOSARTAN POTASSIUM 50 MG PO TABS: 50.0000 mg | ORAL_TABLET | Freq: Every day | ORAL | 11 refills | Status: DC

## 2016-01-23 MED ORDER — SPIRONOLACTONE 25 MG PO TABS: 25.0000 mg | ORAL_TABLET | Freq: Every day | ORAL | 11 refills | Status: DC

## 2016-01-23 NOTE — Patient Instructions (Addendum)
It was nice to see you today in clinic.  Please increase your carvedilol to 12.5mg  twice a day. You can take 2 of your 6.25mg  twice a day until you run out.  I will you with lab results - we may make other medication changes.  Follow up with [name] in 3 weeks.

## 2016-01-23 NOTE — Progress Notes (Signed)
Patient ID: Colton BorsVictor Loria Jr.                 DOB: Oct 02, 1955                      MRN: [id number]     HPI: Colton BorsVictor Sweitzer Jr. is a 60 y.o. male referred by [name] to HTN clinic for HTN and heart failure medication optimization. He has a hx of dilated cardiomyopathy with EF 35-40% in setting of acute CVA, HTN, CAD, HLD, AF and stroke-like episode s/p IV tPA. At his last OV with [name] last week, Inderal was switched to Coreg 6.25 mg BID, and losartan 25mg  daily and spironolactone 12.5 mg daily were initiated. Today he presents for BMET and  heart failure and hypertension medication follow up.    Patient reports he has been feeling better, is not foggy at all (had been on propranolol). Denies dizziness, blurred vision, and headache. Reports compliance with his medications except he has been taking his spironolactone 12.5mg  twice daily rather than once daily as prescribed.  Current HTN meds: carvedilol 6.25 mg BID, losartan 25 mg daily, spironolactone 12.5 mg daily Previously tried: propranolol - foggy headed  BP goal: <140/90 mmHg  Family History: Heart disease in his father. Social History: Former smoker, with 60 pack-year history. Never used smokeless tobacco. Occasional alcohol use.   Wt Readings from Last 3 Encounters:  01/17/16 285 lb (129.3 kg)  01/09/16 285 lb 12.8 oz (129.6 kg)  12/28/15 285 lb 9.6 oz (129.5 kg)   BP Readings from Last 3 Encounters:  01/09/16 (!) 148/88  12/28/15 (!) 159/106  12/18/15 (!) 142/94   Pulse Readings from Last 3 Encounters:  01/09/16 70  12/28/15 67  12/18/15 65    Renal function: CrCl cannot be calculated (Patient's most recent lab result is older than the maximum 21 days allowed.).  Past Medical History:  Diagnosis Date  . Depressed left ventricular ejection fraction   . Discoid lupus   . Former smoker   . [facility]  . Hyperlipidemia   . Rosacea   . Stroke [facility](HCC)    s/p tpa 2017 w/o residual defecit     Current Outpatient Prescriptions on File Prior to Visit  Medication Sig Dispense Refill  . aspirin 325 MG tablet Take 1 tablet (325 mg total) by mouth daily. 30 tablet 2  . carvedilol (COREG) 6.25 MG tablet Take 1 tablet (6.25 mg total) by mouth 2 (two) times daily. 60 tablet 11  . cephALEXin (KEFLEX) 500 MG capsule Take 1 capsule (500 mg total) by mouth 2 (two) times daily. Take for 10 days as needed for rosacea.  Disp 2 courses 40 capsule 0  . colchicine 0.6 MG tablet Take 1 tablet (0.6 mg total) by mouth daily as needed (gout). Take up to tid for the next couple of days for pain relief post hospitalization 30 tablet 0  . indomethacin (INDOCIN) 50 MG capsule Take 1 capsule (50 mg total) by mouth 2 (two) times daily with a meal. As needed for gout 60 capsule 0  . losartan (COZAAR) 25 MG tablet Take 1 tablet (25 mg total) by mouth daily. 30 tablet 11  . spironolactone (ALDACTONE) 25 MG tablet Take 0.5 tablets (12.5 mg total) by mouth daily. 15 tablet 11  . Tetrahydrozoline HCl (VISINE OP) Place 1 drop into both eyes as needed (for dry eyes).     No current facility-administered medications on file  prior to visit.     Allergies  Allergen Reactions  . Corticosteroids Other (See Comments)    Muscle spasms  . Latex Rash     Assessment/Plan:  1. Hypertension/HF med titration: BP above goal < 140/90mmHg and HR ~80. Will increase carvedilol to 12.5mg  BID to target HR in the low 60s. BMET drawn today shows stable K and improved SCr. Called pt with results and will make the following changes: Will have pt continue taking spironolactone 25mg  once daily and will increase losartan to 50mg  once daily. Pt has follow up with Dr. Mayford Knife in 3 weeks - will recheck BMET at that visit.   Megan E. Supple, PharmD, CPP [facility] [address], [place], [place] [address] Phone: [phone]; Fax: [phone] 01/23/2016 8:44 AM

## 2016-01-24 ENCOUNTER — Telehealth: Payer: Self-pay

## 2016-01-24 DIAGNOSIS — R9439 Abnormal result of other cardiovascular function study: Secondary | ICD-10-CM

## 2016-01-24 NOTE — Telephone Encounter (Signed)
-----   Message from Quintella Reichertraci R Turner, MD sent at 01/19/2016  7:00 PM EDT ----- ? Apical septal ischemia but very small area.  EF is reduced at 30%.  Given recent CVA would like to avoid cath initially.  Please set patient up for coronary CTA with morphology and calcium score

## 2016-01-24 NOTE — Telephone Encounter (Signed)
Notes Recorded by Henrietta DineKathryn A Remberto Lienhard, RN on 01/24/2016 at 2:34 PM EDT Patient spoke with Billing Department and agrees to Cardiac CT. Cardiac CT ordered for scheduling. Patient agrees with treatment plan. ------  Notes Recorded by Henrietta DineKathryn A Alix Lahmann, RN on 01/22/2016 at 5:39 PM EDT Informed patient of results and verbal understanding expressed.  Patient does not have insurance.  Will request Billing call him to discuss pricing before agreeing to test. Patient agrees with treatment plan.

## 2016-01-25 ENCOUNTER — Encounter: Payer: Self-pay | Admitting: Cardiology

## 2016-01-26 ENCOUNTER — Encounter: Payer: Self-pay | Admitting: Cardiology

## 2016-01-30 ENCOUNTER — Telehealth: Payer: Self-pay | Admitting: Cardiology

## 2016-01-30 NOTE — Telephone Encounter (Signed)
Patient stated he had "someone" email him test instructions and he has no further questions. He was grateful for call back.

## 2016-01-30 NOTE — Telephone Encounter (Signed)
New Message:     Please call,concerning his test he is having tomorrow.

## 2016-01-31 ENCOUNTER — Encounter: Payer: Self-pay | Admitting: Family Medicine

## 2016-01-31 ENCOUNTER — Ambulatory Visit (HOSPITAL_COMMUNITY)
Admission: RE | Admit: 2016-01-31 | Discharge: 2016-01-31 | Disposition: A | Discharge status: Home / Self Care | Payer: Self-pay | Source: Ambulatory Visit | Attending: Cardiology | Admitting: Cardiology

## 2016-01-31 DIAGNOSIS — I2510 Atherosclerotic heart disease of native coronary artery without angina pectoris: Secondary | ICD-10-CM | POA: Insufficient documentation

## 2016-01-31 DIAGNOSIS — R9439 Abnormal result of other cardiovascular function study: Secondary | ICD-10-CM

## 2016-01-31 DIAGNOSIS — K760 Fatty (change of) liver, not elsewhere classified: Secondary | ICD-10-CM | POA: Insufficient documentation

## 2016-01-31 DIAGNOSIS — R931 Abnormal findings on diagnostic imaging of heart and coronary circulation: Secondary | ICD-10-CM

## 2016-01-31 MED ORDER — METOPROLOL TARTRATE 5 MG/5ML IV SOLN
INTRAVENOUS | Status: AC
  Filled 2016-01-31: qty 20

## 2016-01-31 MED ORDER — METOPROLOL TARTRATE 5 MG/5ML IV SOLN
5.0000 mg | INTRAVENOUS | Status: AC | PRN
  Administered 2016-01-31 (×4): 5 mg via INTRAVENOUS

## 2016-01-31 MED ORDER — NITROGLYCERIN 0.4 MG SL SUBL
0.8000 mg | SUBLINGUAL_TABLET | Freq: Once | SUBLINGUAL | Status: AC
  Administered 2016-01-31: 0.8 mg via SUBLINGUAL
  Filled 2016-01-31: qty 25

## 2016-01-31 MED ORDER — NITROGLYCERIN 0.4 MG SL SUBL
SUBLINGUAL_TABLET | SUBLINGUAL | Status: AC
  Filled 2016-01-31: qty 2

## 2016-01-31 MED ORDER — IOPAMIDOL (ISOVUE-370) INJECTION 76%
INTRAVENOUS | Status: AC
  Administered 2016-01-31: 80 mL via INTRAVENOUS
  Filled 2016-01-31: qty 100

## 2016-02-02 ENCOUNTER — Telehealth: Payer: Self-pay

## 2016-02-02 DIAGNOSIS — E785 Hyperlipidemia, unspecified: Secondary | ICD-10-CM

## 2016-02-02 MED ORDER — ATORVASTATIN CALCIUM 20 MG PO TABS: 20.0000 mg | ORAL_TABLET | Freq: Every day | ORAL | 11 refills | Status: DC

## 2016-02-02 NOTE — Telephone Encounter (Signed)
-----   Message from Quintella Reichertraci R Turner, MD sent at 01/31/2016  9:23 PM EDT ----- Please let patient know that he has some calcium noted in the coronary arteries and appears borderline obstructive in the LCx.  We are getting a fractional flow on the LCX to determine if it is flow limiting.  He needs aggressive risk factor modification.  Please find out if he has had any chest pain or SOB.  Last LDL was not at goal.  Please start Lipitor 20mg  daily and repeat FLP and ALT in 6 weeks

## 2016-02-02 NOTE — Telephone Encounter (Signed)
Explained to patient that results are still pending on fractional flow of LCX, but he understands what has been resulted thus far. He understands aggressive risk factor modification is needed.  Patient has no complaints of CP or SOB, but understands to call if symptoms occur. Instructed patient to START LIPITOR 20 mg daily. FLP and ALT scheduled 10/23. Patient agrees with treatment plan and awaits call with fractional flow and further recommendations.

## 2016-02-05 ENCOUNTER — Other Ambulatory Visit: Payer: Self-pay | Admitting: Physician Assistant

## 2016-02-05 ENCOUNTER — Encounter: Payer: Self-pay | Admitting: *Deleted

## 2016-02-05 ENCOUNTER — Encounter: Payer: Self-pay | Admitting: Physician Assistant

## 2016-02-05 ENCOUNTER — Ambulatory Visit: Payer: Self-pay | Admitting: Physician Assistant

## 2016-02-05 ENCOUNTER — Ambulatory Visit (INDEPENDENT_AMBULATORY_CARE_PROVIDER_SITE_OTHER): Payer: Self-pay | Admitting: Physician Assistant

## 2016-02-05 VITALS — BP 124/88 | HR 75 | Ht 72.0 in | Wt 284.0 lb

## 2016-02-05 DIAGNOSIS — E785 Hyperlipidemia, unspecified: Secondary | ICD-10-CM

## 2016-02-05 DIAGNOSIS — E669 Obesity, unspecified: Secondary | ICD-10-CM

## 2016-02-05 DIAGNOSIS — I429 Cardiomyopathy, unspecified: Secondary | ICD-10-CM

## 2016-02-05 DIAGNOSIS — I639 Cerebral infarction, unspecified: Secondary | ICD-10-CM

## 2016-02-05 DIAGNOSIS — R0789 Other chest pain: Secondary | ICD-10-CM

## 2016-02-05 DIAGNOSIS — I10 Essential (primary) hypertension: Secondary | ICD-10-CM

## 2016-02-05 DIAGNOSIS — R2990 Unspecified symptoms and signs involving the nervous system: Secondary | ICD-10-CM

## 2016-02-05 DIAGNOSIS — F17200 Nicotine dependence, unspecified, uncomplicated: Secondary | ICD-10-CM

## 2016-02-05 LAB — CBC
HCT: 42.2 % (ref 38.5–50.0)
HEMOGLOBIN: 14.3 g/dL (ref 13.2–17.1)
MCH: 29.4 pg (ref 27.0–33.0)
MCHC: 33.9 g/dL (ref 32.0–36.0)
MCV: 86.8 fL (ref 80.0–100.0)
MPV: 12.2 fL (ref 7.5–12.5)
PLATELETS: 129 10*3/uL — AB (ref 140–400)
RBC: 4.86 MIL/uL (ref 4.20–5.80)
RDW: 14.2 % (ref 11.0–15.0)
WBC: 8.4 10*3/uL (ref 3.8–10.8)

## 2016-02-05 LAB — COMPREHENSIVE METABOLIC PANEL
ALT: 23 U/L (ref 9–46)
AST: 18 U/L (ref 10–35)
Albumin: 4.3 g/dL (ref 3.6–5.1)
Alkaline Phosphatase: 55 U/L (ref 40–115)
BUN: 19 mg/dL (ref 7–25)
CHLORIDE: 105 mmol/L (ref 98–110)
CO2: 24 mmol/L (ref 20–31)
CREATININE: 1.19 mg/dL (ref 0.70–1.25)
Calcium: 9.1 mg/dL (ref 8.6–10.3)
GLUCOSE: 109 mg/dL — AB (ref 65–99)
POTASSIUM: 4.7 mmol/L (ref 3.5–5.3)
SODIUM: 138 mmol/L (ref 135–146)
TOTAL PROTEIN: 7.1 g/dL (ref 6.1–8.1)
Total Bilirubin: 0.5 mg/dL (ref 0.2–1.2)

## 2016-02-05 LAB — APTT: APTT: 31 s (ref 22–34)

## 2016-02-05 NOTE — Patient Instructions (Addendum)
Medication Instructions:   Your physician recommends that you continue on your current medications as directed. Please refer to the Current Medication list given to you today.   If you need a refill on your cardiac medications before your next appointment, please call your pharmacy.   Labwork:  CMET CBC AND PTT   Testing/Procedures: SEE LETTER FOR CATH    Follow-Up: KEEP APPT WITH DR TURNER AS SCHEDULED   Any Other Special Instructions Will Be Listed Below (If Applicable).

## 2016-02-05 NOTE — Progress Notes (Signed)
Cardiology Office Note    Date:  02/05/2016   ID:  Colton Bors., DOB [birth year], MRN [id number]  PCP:  Colton Givens, MD  Cardiologist: Dr. Mayford Knife  Chief Complaint  Patient presents with  . Follow-up    History of Present Illness:  Colton Helwig. is a 60 y.o. male with a history of DCM, remote tobacco use, hyperlipidemia, CVA and discoid lupus  He suffered a strokelike episode in June 2017 treated with IV TPA and made full recovery with no CVA on MRI.  2D echo at that time showed LV dysfunction with EF 35-40% with elevated LDL at 82.  He was started on ASA and statin but stopped the statin due to fear of side effects.  He was seen by his PCP recently and was complaining of increased fatigue.  He says that he occasionally has a heavy feeling in his chest off and on for several years and even mentioned that he though it was angina.  He never told anyone.  It is located midsternal but sometimes in the axilla.  There is no associated diaphoresis, SOB or nausea with the pain.  He does have a remote history of tobacco use.  They are exertional and nonexertional but expecially with anxiety.  He occasionally will have some DOE with certain activities.  He denies any  LE edema, palpitations or syncope.  He has had problems with dizziness since his CVA.  He is now here for further evaluation of LV dysfunction.  There is no family history of DCM or sudden cardiac death.     With dilated cardiomyopathy EF 35-40% by recent echo nuclear stress test was ordered. He had moderate decrease in LVEF 30-44% findings consistent with prior MI with a defect in the inferior, inferolateral wall consistent with scar and/or soft tissue attenuation LVEF 30%.   Dr. Mayford Knife then ordered a CTA which showed a calcium score of 19. This was 37 percentile for age and sex matched control. Normal origin. Left dominance. Mild to moderate diffuse mixed plaque in the proximal LAD, moderate and possibly severe noncalcified  plaque in the mid circumflex with high risk features. A FFR was ordered on the patient circumflex and came back less than 0.7 consistent with significant stenosis. Dr. Mayford Knife spoke with the patient and recommended cardiac catheterization. He expressed concern because of his prior CVA in June. She cited a 1 in 2000 risk of CVA. Patient has a very small nondominant RCA and a large circumflex that supplies a large area of myocardium with a very tight lesion and if this closes down he could have a significant MI with worsening LV dysfunction which he may not survive. He agreed to proceed with left heart catheterization is here today to discuss.  Patient without recent symptoms of chest tightness. He had many questions surrounding heart catheterization which I covered in detail with him.   Past Medical History:  Diagnosis Date  . Depressed left ventricular ejection fraction   . Discoid lupus   . Fatty liver   . Former smoker   . Gout 07/2001   [facility] [name]  . Hyperlipidemia   . Rosacea   . Stroke [facility])    s/p tpa 2017 w/o residual defecit    Past Surgical History:  Procedure Laterality Date  . Cardiolite  08/16/2008   [facility]    Current Medications: Outpatient Medications Prior to Visit  Medication Sig Dispense Refill  . aspirin 325 MG tablet Take 1 tablet (325 mg total)  by mouth daily. 30 tablet 2  . atorvastatin (LIPITOR) 20 MG tablet Take 1 tablet (20 mg total) by mouth daily. 30 tablet 11  . carvedilol (COREG) 12.5 MG tablet Take 1 tablet (12.5 mg total) by mouth 2 (two) times daily. 60 tablet 11  . cephALEXin (KEFLEX) 500 MG capsule Take 1 capsule (500 mg total) by mouth 2 (two) times daily. Take for 10 days as needed for rosacea.  Disp 2 courses 40 capsule 0  . colchicine 0.6 MG tablet Take 1 tablet (0.6 mg total) by mouth daily as needed (gout). Take up to tid for the next couple of days for pain relief post hospitalization 30 tablet 0  . indomethacin (INDOCIN) 50 MG  capsule Take 1 capsule (50 mg total) by mouth 2 (two) times daily with a meal. As needed for gout 60 capsule 0  . losartan (COZAAR) 50 MG tablet Take 1 tablet (50 mg total) by mouth daily. 30 tablet 11  . spironolactone (ALDACTONE) 25 MG tablet Take 1 tablet (25 mg total) by mouth daily. 30 tablet 11  . Tetrahydrozoline HCl (VISINE OP) Place 1 drop into both eyes as needed (for dry eyes).     No facility-administered medications prior to visit.      Allergies:   Corticosteroids and Latex   Social History   Social History  . Marital status: Single    Spouse name: N/A  . Number of children: 0  . Years of education: N/A   Occupational History  . Property Patents/Inventions/Patents    Social History Main Topics  . Smoking status: Former Smoker    Packs/day: 2.00    Years: 30.00    Types: Cigarettes  . Smokeless tobacco: Never Used  . Alcohol use 1.2 oz/week    1 Glasses of wine, 1 Shots of liquor per week     Comment: occasionally  . Drug use: No  . Sexual activity: Not Asked   Other Topics Concern  . 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   ROS:   Please see the history of present illness.    Review of Systems  Constitution: Positive for weakness and malaise/fatigue.  HENT: Negative.   Cardiovascular: Positive for chest pain.  Respiratory: Negative.   Endocrine: Negative.   Hematologic/Lymphatic: Negative.   Musculoskeletal: Negative.   Gastrointestinal: Negative.   Genitourinary: Negative.   Neurological: Positive for numbness and paresthesias.   All other systems reviewed and are negative.   PHYSICAL EXAM:   VS:  BP 124/88   Pulse 75   Ht 6' (1.829 m)   Wt 284 lb (128.8 kg)   BMI 38.52 kg/m   Physical Exam  GEN:  Obese, in no acute distress  HEENT: normal  Neck: no JVD, carotid bruits, or masses Cardiac:RRR; no murmurs, rubs, or gallops  Respiratory:  clear to auscultation bilaterally, normal work of breathing GI: soft, nontender, nondistended, + BS Ext: without cyanosis, clubbing, or edema, Good distal pulses bilaterally MS: no deformity or atrophy  Skin: warm and dry, no rash Neuro:  Alert and Oriented x 3, Strength and sensation are intact Psych: euthymic mood, full affect  Wt Readings from Last 3 Encounters:  02/05/16 284 lb (128.8 kg)  01/23/16 287 lb 8 oz (130.4 kg)  01/17/16 285 lb (129.3 kg)      Studies/Labs Reviewed:   EKG:  EKG is ordered today.  The ekg ordered today demonstrates Normal sinus rhythm nonspecific T-wave abnormality laterally T-wave inversion in V6 new from last EKG tracing in June  Recent Labs: 10/28/2015: ALT 20; Hemoglobin 14.7; Platelets 110 12/18/2015: TSH 0.93 01/23/2016: BUN 21; Creat 1.17; Potassium 4.6; Sodium 137   Lipid Panel    Component Value Date/Time   CHOL 167 10/29/2015 0342   TRIG 303 (H) 10/29/2015 0342   HDL 24 (L) 10/29/2015 0342   CHOLHDL 7.0 10/29/2015 0342   VLDL 61 (H) 10/29/2015 0342   LDLCALC 82 10/29/2015 0342   LDLDIRECT 104.4 05/12/2008 1304    Additional studies/ records that were reviewed today include:   CTA 01/31/16 IMPRESSION: 1. Coronary calcium score of 19. This was 37 percentile for age and sex matched control.   2. Normal origin.  Left dominance.   3.  Mild to moderate diffuse mixed plaque in proximal LAD.   4. Moderate and possibly severe non-calcified plaque in the mid LCX with high risk features. We will try to evaluate with CT FFR.   Tobias Alexander   nuclear stress test 01/18/16 Study Highlights   The left ventricular ejection fraction is moderately decreased (30-44%).  Nuclear stress EF: 30%.  There was no ST segment deviation noted during stress.  Findings consistent with prior myocardial  infarction.  This is a high risk study.  Defect in the inferior, inferolateral wall consistent with scar and/or soft tissue attenuation. LVEF 30%     2-D echo 10/31/15 Study Conclusions   - Left ventricle: The cavity size was moderately dilated. Wall   thickness was normal. Systolic function was moderately reduced.   The estimated ejection fraction was in the range of 35% to 40%.   Features are consistent with a pseudonormal left ventricular   filling pattern, with concomitant abnormal relaxation and   increased filling pressure (grade 2 diastolic dysfunction).   Carotid Dopplers 10/30/15 Summary: There is no obvious evidence of hemodynamically significant internal carotid artery stenosis bilaterally. Vertebral arteries are patent with antegrade flow.   Other specific details can be found in the table(s) above. Prepared and Electronically Authenticated by   Delia Heady MD 2017-06-06T08:28:54   ASSESSMENT:    1. Other chest pain   2. Cardiomyopathy (HCC)   3. Essential hypertension   4. Obesity   5. Tobacco use disorder   6. Hyperlipidemia   7. Stroke-like episode (HCC) s/p IV tPA      PLAN:  In order of problems listed above:  Chest pain with abnormal nuclear stress test and CTA with reduced FFR of less than 0.7 consistent with significant stenosis in the circumflex. Has a very small nondominant RCA and large circumflex that supplies a large area of myocardium with a very tight lesion. Dr. Mayford Knife recommends left heart catheterization and possible PCI. She explained the risk of CVAs 1 in 2000 because of his recent CVA in June. She feels the risk of a MI is more significant. Patient is agreeable to proceed. I have reviewed the risks, indications, and alternatives to angioplasty and stenting with the patient. Risks include but are not limited to bleeding, infection, vascular injury, stroke, myocardial infection, arrhythmia, kidney injury, radiation-related injury in the case  of prolonged fluoroscopy use, emergency cardiac surgery, and death. The patient understands the risks of serious complication is low (<1%) and he agrees  to proceed.   Cardiomyopathy ejection fraction 30% on nuclear stress test, 35-40% on 2-D echo in June 2017. No evidence of heart failure  Essential hypertension controlled  Obesity weight loss recommended  Tobacco abuse patient quit smoking 3 months ago  Hyperlipidemia on Lipitor 20 mg daily  Stroke in June 2017 treated with TPA     Medication Adjustments/Labs and Tests Ordered: Current medicines are reviewed at length with the patient today.  Concerns regarding medicines are outlined above.  Medication changes, Labs and Tests ordered today are listed in the Patient Instructions below. Patient Instructions  Medication Instructions:   Your physician recommends that you continue on your current medications as directed. Please refer to the Current Medication list given to you today.   If you need a refill on your cardiac medications before your next appointment, please call your pharmacy.   Labwork:  CMET CBC AND PTT   Testing/Procedures: SEE LETTER FOR CATH    Follow-Up: IN 2 WEEKS  AFTER POST 02/06/16  CATH WITH AN AVAILABLE EXTENDER  DR TURNER IN 2 MONTHS IF AVAILABLE IF NOT FIRST AVAILABLE   Any Other Special Instructions Will Be Listed Below (If Applicable).                                                                                                                                                      Elson Clan, PA-C  02/05/2016 11:49 AM    [name] [address] [place], [place], [place]  [address] Phone: [phone]; Fax: [phone]

## 2016-02-06 ENCOUNTER — Encounter (HOSPITAL_COMMUNITY): Payer: Self-pay | Admitting: Interventional Cardiology

## 2016-02-06 ENCOUNTER — Encounter (HOSPITAL_COMMUNITY)
Admission: RE | Disposition: A | Discharge status: Home / Self Care | Payer: Self-pay | Source: Ambulatory Visit | Attending: Interventional Cardiology

## 2016-02-06 ENCOUNTER — Ambulatory Visit (HOSPITAL_COMMUNITY)
Admission: RE | Admit: 2016-02-06 | Discharge: 2016-02-07 | Disposition: A | Discharge status: Home / Self Care | Payer: Self-pay | Source: Ambulatory Visit | Attending: Interventional Cardiology | Admitting: Interventional Cardiology

## 2016-02-06 DIAGNOSIS — Z6838 Body mass index (BMI) 38.0-38.9, adult: Secondary | ICD-10-CM | POA: Insufficient documentation

## 2016-02-06 DIAGNOSIS — E669 Obesity, unspecified: Secondary | ICD-10-CM | POA: Insufficient documentation

## 2016-02-06 DIAGNOSIS — Z8249 Family history of ischemic heart disease and other diseases of the circulatory system: Secondary | ICD-10-CM | POA: Insufficient documentation

## 2016-02-06 DIAGNOSIS — I420 Dilated cardiomyopathy: Secondary | ICD-10-CM

## 2016-02-06 DIAGNOSIS — Z87891 Personal history of nicotine dependence: Secondary | ICD-10-CM | POA: Insufficient documentation

## 2016-02-06 DIAGNOSIS — I10 Essential (primary) hypertension: Secondary | ICD-10-CM | POA: Insufficient documentation

## 2016-02-06 DIAGNOSIS — I2583 Coronary atherosclerosis due to lipid rich plaque: Secondary | ICD-10-CM | POA: Insufficient documentation

## 2016-02-06 DIAGNOSIS — R0789 Other chest pain: Secondary | ICD-10-CM

## 2016-02-06 DIAGNOSIS — I252 Old myocardial infarction: Secondary | ICD-10-CM | POA: Insufficient documentation

## 2016-02-06 DIAGNOSIS — I25119 Atherosclerotic heart disease of native coronary artery with unspecified angina pectoris: Secondary | ICD-10-CM | POA: Insufficient documentation

## 2016-02-06 DIAGNOSIS — K760 Fatty (change of) liver, not elsewhere classified: Secondary | ICD-10-CM | POA: Insufficient documentation

## 2016-02-06 DIAGNOSIS — I2510 Atherosclerotic heart disease of native coronary artery without angina pectoris: Secondary | ICD-10-CM

## 2016-02-06 DIAGNOSIS — Z8673 Personal history of transient ischemic attack (TIA), and cerebral infarction without residual deficits: Secondary | ICD-10-CM | POA: Insufficient documentation

## 2016-02-06 DIAGNOSIS — Z7982 Long term (current) use of aspirin: Secondary | ICD-10-CM | POA: Insufficient documentation

## 2016-02-06 DIAGNOSIS — I209 Angina pectoris, unspecified: Secondary | ICD-10-CM

## 2016-02-06 DIAGNOSIS — M109 Gout, unspecified: Secondary | ICD-10-CM | POA: Insufficient documentation

## 2016-02-06 DIAGNOSIS — F419 Anxiety disorder, unspecified: Secondary | ICD-10-CM | POA: Insufficient documentation

## 2016-02-06 DIAGNOSIS — E785 Hyperlipidemia, unspecified: Secondary | ICD-10-CM

## 2016-02-06 DIAGNOSIS — L930 Discoid lupus erythematosus: Secondary | ICD-10-CM | POA: Insufficient documentation

## 2016-02-06 DIAGNOSIS — Z955 Presence of coronary angioplasty implant and graft: Secondary | ICD-10-CM

## 2016-02-06 HISTORY — DX: Atherosclerotic heart disease of native coronary artery without angina pectoris: I25.10

## 2016-02-06 HISTORY — DX: Essential (primary) hypertension: I10

## 2016-02-06 HISTORY — PX: CARDIAC CATHETERIZATION: SHX172

## 2016-02-06 HISTORY — DX: Personal history of other diseases of the musculoskeletal system and connective tissue: Z87.39

## 2016-02-06 LAB — POCT ACTIVATED CLOTTING TIME: ACTIVATED CLOTTING TIME: 604 s

## 2016-02-06 LAB — PROTIME-INR
INR: 1.09
PROTHROMBIN TIME: 14.1 s (ref 11.4–15.2)

## 2016-02-06 SURGERY — LEFT HEART CATH AND CORONARY ANGIOGRAPHY
Anesthesia: LOCAL

## 2016-02-06 MED ORDER — IOPAMIDOL (ISOVUE-370) INJECTION 76%
INTRAVENOUS | Status: AC
  Filled 2016-02-06: qty 100

## 2016-02-06 MED ORDER — HYDRALAZINE HCL 20 MG/ML IJ SOLN: 10.0000 mg | INTRAMUSCULAR | Status: DC | PRN

## 2016-02-06 MED ORDER — ANGIOPLASTY BOOK
Freq: Once | Status: AC
  Administered 2016-02-06: 19:00:00
  Filled 2016-02-06: qty 1

## 2016-02-06 MED ORDER — BIVALIRUDIN BOLUS VIA INFUSION - CUPID
INTRAVENOUS | Status: DC | PRN
  Administered 2016-02-06: 96.975 mg via INTRAVENOUS

## 2016-02-06 MED ORDER — TRAZODONE HCL 50 MG PO TABS
50.0000 mg | ORAL_TABLET | Freq: Every day | ORAL | Status: DC
  Filled 2016-02-06: qty 1

## 2016-02-06 MED ORDER — MIDAZOLAM HCL 2 MG/2ML IJ SOLN
INTRAMUSCULAR | Status: AC
  Filled 2016-02-06: qty 2

## 2016-02-06 MED ORDER — CLOPIDOGREL BISULFATE 300 MG PO TABS
ORAL_TABLET | ORAL | Status: DC | PRN
  Administered 2016-02-06: 600 mg via ORAL

## 2016-02-06 MED ORDER — SODIUM CHLORIDE 0.9 % IV SOLN: 250.0000 mL | INTRAVENOUS | Status: DC | PRN

## 2016-02-06 MED ORDER — CLOPIDOGREL BISULFATE 75 MG PO TABS
75.0000 mg | ORAL_TABLET | Freq: Every day | ORAL | Status: DC
  Administered 2016-02-07: 75 mg via ORAL
  Filled 2016-02-06: qty 1

## 2016-02-06 MED ORDER — ASPIRIN 81 MG PO CHEW: 81.0000 mg | CHEWABLE_TABLET | Freq: Every day | ORAL | Status: DC

## 2016-02-06 MED ORDER — ADENOSINE (DIAGNOSTIC) 140MCG/KG/MIN
INTRAVENOUS | Status: DC | PRN
  Administered 2016-02-06: 140 ug/kg/min via INTRAVENOUS

## 2016-02-06 MED ORDER — HEPARIN (PORCINE) IN NACL 2-0.9 UNIT/ML-% IJ SOLN
INTRAMUSCULAR | Status: DC | PRN
  Administered 2016-02-06: 1000 mL

## 2016-02-06 MED ORDER — SODIUM CHLORIDE 0.9% FLUSH: 3.0000 mL | INTRAVENOUS | Status: DC | PRN

## 2016-02-06 MED ORDER — ASPIRIN 81 MG PO CHEW
81.0000 mg | CHEWABLE_TABLET | Freq: Every day | ORAL | Status: DC
  Administered 2016-02-07: 09:00:00 81 mg via ORAL
  Filled 2016-02-06: qty 1

## 2016-02-06 MED ORDER — FENTANYL CITRATE (PF) 100 MCG/2ML IJ SOLN
INTRAMUSCULAR | Status: AC
  Filled 2016-02-06: qty 2

## 2016-02-06 MED ORDER — FAMOTIDINE IN NACL 20-0.9 MG/50ML-% IV SOLN
INTRAVENOUS | Status: DC | PRN
  Administered 2016-02-06: 20 mg via INTRAVENOUS

## 2016-02-06 MED ORDER — BIVALIRUDIN 250 MG IV SOLR
INTRAVENOUS | Status: AC
  Filled 2016-02-06: qty 250

## 2016-02-06 MED ORDER — FAMOTIDINE IN NACL 20-0.9 MG/50ML-% IV SOLN
INTRAVENOUS | Status: AC
  Filled 2016-02-06: qty 50

## 2016-02-06 MED ORDER — SODIUM CHLORIDE 0.9 % IV SOLN: INTRAVENOUS | Status: AC

## 2016-02-06 MED ORDER — COLCHICINE 0.6 MG PO TABS: 0.6000 mg | ORAL_TABLET | Freq: Every day | ORAL | Status: DC | PRN

## 2016-02-06 MED ORDER — CARVEDILOL 12.5 MG PO TABS
12.5000 mg | ORAL_TABLET | Freq: Two times a day (BID) | ORAL | Status: DC
  Administered 2016-02-06 – 2016-02-07 (×2): 12.5 mg via ORAL
  Filled 2016-02-06 (×2): qty 1

## 2016-02-06 MED ORDER — IOPAMIDOL (ISOVUE-370) INJECTION 76%
INTRAVENOUS | Status: DC | PRN
  Administered 2016-02-06: 130 mL via INTRA_ARTERIAL

## 2016-02-06 MED ORDER — LOSARTAN POTASSIUM 50 MG PO TABS
50.0000 mg | ORAL_TABLET | Freq: Every day | ORAL | Status: DC
  Administered 2016-02-07: 50 mg via ORAL
  Filled 2016-02-06: qty 1

## 2016-02-06 MED ORDER — ATORVASTATIN CALCIUM 20 MG PO TABS
20.0000 mg | ORAL_TABLET | Freq: Every day | ORAL | Status: DC
  Administered 2016-02-06: 19:00:00 20 mg via ORAL
  Filled 2016-02-06: qty 1

## 2016-02-06 MED ORDER — SODIUM CHLORIDE 0.9% FLUSH: 3.0000 mL | Freq: Two times a day (BID) | INTRAVENOUS | Status: DC

## 2016-02-06 MED ORDER — HEPARIN SODIUM (PORCINE) 1000 UNIT/ML IJ SOLN
INTRAMUSCULAR | Status: AC
  Filled 2016-02-06: qty 1

## 2016-02-06 MED ORDER — CLOPIDOGREL BISULFATE 300 MG PO TABS
ORAL_TABLET | ORAL | Status: AC
  Filled 2016-02-06: qty 2

## 2016-02-06 MED ORDER — ADENOSINE 12 MG/4ML IV SOLN
INTRAVENOUS | Status: AC
  Filled 2016-02-06: qty 16

## 2016-02-06 MED ORDER — ACETAMINOPHEN 325 MG PO TABS: 650.0000 mg | ORAL_TABLET | ORAL | Status: DC | PRN

## 2016-02-06 MED ORDER — ASPIRIN 81 MG PO CHEW
81.0000 mg | CHEWABLE_TABLET | ORAL | Status: DC
Start: 2016-02-06 — End: 2016-02-06

## 2016-02-06 MED ORDER — ONDANSETRON HCL 4 MG/2ML IJ SOLN: 4.0000 mg | Freq: Four times a day (QID) | INTRAMUSCULAR | Status: DC | PRN

## 2016-02-06 MED ORDER — SODIUM CHLORIDE 0.9 % IV SOLN
INTRAVENOUS | Status: DC | PRN
  Administered 2016-02-06 (×2): 1.75 mg/kg/h via INTRAVENOUS

## 2016-02-06 MED ORDER — SODIUM CHLORIDE 0.9 % WEIGHT BASED INFUSION
3.0000 mL/kg/h | INTRAVENOUS | Status: DC
  Administered 2016-02-06: 3 mL/kg/h via INTRAVENOUS

## 2016-02-06 MED ORDER — LIDOCAINE HCL (PF) 1 % IJ SOLN
INTRAMUSCULAR | Status: AC
Start: 2016-02-06 — End: 2016-02-06
  Filled 2016-02-06: qty 30

## 2016-02-06 MED ORDER — HEPARIN SODIUM (PORCINE) 1000 UNIT/ML IJ SOLN
INTRAMUSCULAR | Status: DC | PRN
  Administered 2016-02-06: 6000 [IU] via INTRAVENOUS

## 2016-02-06 MED ORDER — SODIUM CHLORIDE 0.9 % WEIGHT BASED INFUSION: 1.0000 mL/kg/h | INTRAVENOUS | Status: DC

## 2016-02-06 MED ORDER — FENTANYL CITRATE (PF) 100 MCG/2ML IJ SOLN
INTRAMUSCULAR | Status: DC | PRN
  Administered 2016-02-06 (×3): 25 ug via INTRAVENOUS

## 2016-02-06 MED ORDER — SODIUM CHLORIDE 0.9 % IV SOLN
1.7500 mg/kg/h | INTRAVENOUS | Status: AC
  Filled 2016-02-06: qty 250

## 2016-02-06 MED ORDER — VERAPAMIL HCL 2.5 MG/ML IV SOLN
INTRAVENOUS | Status: AC
  Filled 2016-02-06: qty 2

## 2016-02-06 MED ORDER — HYDRALAZINE HCL 20 MG/ML IJ SOLN
INTRAMUSCULAR | Status: DC | PRN
  Administered 2016-02-06 (×2): 10 mg via INTRAVENOUS

## 2016-02-06 MED ORDER — VERAPAMIL HCL 2.5 MG/ML IV SOLN
INTRAVENOUS | Status: DC | PRN
  Administered 2016-02-06: 10:00:00 via INTRA_ARTERIAL

## 2016-02-06 MED ORDER — HEPARIN (PORCINE) IN NACL 2-0.9 UNIT/ML-% IJ SOLN
INTRAMUSCULAR | Status: AC
  Filled 2016-02-06: qty 1000

## 2016-02-06 MED ORDER — HYDRALAZINE HCL 20 MG/ML IJ SOLN
INTRAMUSCULAR | Status: AC
  Filled 2016-02-06: qty 1

## 2016-02-06 MED ORDER — MIDAZOLAM HCL 2 MG/2ML IJ SOLN
INTRAMUSCULAR | Status: DC | PRN
  Administered 2016-02-06: 2 mg via INTRAVENOUS
  Administered 2016-02-06 (×2): 1 mg via INTRAVENOUS

## 2016-02-06 MED ORDER — LIDOCAINE HCL (PF) 1 % IJ SOLN
INTRAMUSCULAR | Status: DC | PRN
  Administered 2016-02-06: 4 mL via INTRADERMAL

## 2016-02-06 SURGICAL SUPPLY — 20 items
BALLN EMERGE MR 3.0X15 (BALLOONS) ×2
BALLN ~~LOC~~ EMERGE MR 4.5X12 (BALLOONS) ×2
BALLOON EMERGE MR 3.0X15 (BALLOONS) IMPLANT
BALLOON ~~LOC~~ EMERGE MR 4.5X12 (BALLOONS) IMPLANT
CATH INFINITI 4FR JR4 MOD (CATHETERS) ×1 IMPLANT
CATH INFINITI 5 FR JL3.5 (CATHETERS) ×1 IMPLANT
CATH MICROCATH NAVVUS (MICROCATHETER) IMPLANT
DEVICE RAD COMP TR BAND LRG (VASCULAR PRODUCTS) ×1 IMPLANT
GLIDESHEATH SLEND SS 6F .021 (SHEATH) ×1 IMPLANT
GUIDE CATH RUNWAY 6FR CLS3 (CATHETERS) ×1 IMPLANT
KIT ENCORE 26 ADVANTAGE (KITS) ×1 IMPLANT
KIT HEART LEFT (KITS) ×2 IMPLANT
MICROCATHETER NAVVUS (MICROCATHETER) ×2
PACK CARDIAC CATHETERIZATION (CUSTOM PROCEDURE TRAY) ×2 IMPLANT
STENT SYNERGY DES 4X16 (Permanent Stent) ×1 IMPLANT
TRANSDUCER W/STOPCOCK (MISCELLANEOUS) ×2 IMPLANT
TUBING CIL FLEX 10 FLL-RA (TUBING) ×2 IMPLANT
VALVE GUARDIAN II ~~LOC~~ HEMO (MISCELLANEOUS) ×1 IMPLANT
WIRE ASAHI PROWATER 180CM (WIRE) ×1 IMPLANT
WIRE SAFE-T 1.5MM-J .035X260CM (WIRE) ×1 IMPLANT

## 2016-02-06 NOTE — Progress Notes (Signed)
TR BAND REMOVAL  LOCATION:  right radial  DEFLATED PER PROTOCOL:  Yes.    TIME BAND OFF / DRESSING APPLIED:   1600   SITE UPON ARRIVAL:   Level 1  SITE AFTER BAND REMOVAL:  Level 1  CIRCULATION SENSATION AND MOVEMENT:  Within Normal Limits  Yes.    COMMENTS:

## 2016-02-06 NOTE — Care Management Note (Addendum)
Case Management Note  Patient Details  Name: Colton BorsVictor Charney Jr. MRN: [id number] Date of Birth: [birth year]  Subjective/Objective:    Patient is s/p coronary intervention, [facility]  Patient has no insurance, [facility] .  Patient [facility]   NCM will cont to follow for dc needs.                 Action/Plan:   Expected Discharge Date:                  Expected Discharge Plan:  Home/Self Care  In-House Referral:     Discharge planning Services  CM Consult  Post Acute Care Choice:    Choice offered to:     DME Arranged:    DME Agency:     HH Arranged:    HH Agency:     Status of Service:  In process, will continue to follow  If discussed at Long Length of Stay Meetings, dates discussed:    Additional Comments:  Colton Havenaylor, Forrest Demuro Clinton, RN 02/06/2016, 12:14 PM

## 2016-02-06 NOTE — Interval H&P Note (Signed)
Cath Lab Visit (complete for each Cath Lab visit)  Clinical Evaluation Leading to the Procedure:   ACS: No.  Non-ACS:    Anginal Classification: CCS III  Anti-ischemic medical therapy: Minimal Therapy (1 class of medications)  Non-Invasive Test Results: Intermediate-risk stress test findings: cardiac mortality 1-3%/year  Prior CABG: No previous CABG      History and Physical Interval Note:  02/06/2016 9:11 AM  Colton BorsVictor Dara Jr.  has presented today for surgery, with the diagnosis of Chest Pain/Abnormal CTA  The various methods of treatment have been discussed with the patient and family. After consideration of risks, benefits and other options for treatment, the patient has consented to  Procedure(s): Left Heart Cath and Coronary Angiography (N/A) as a surgical intervention .  The patient's history has been reviewed, patient examined, no change in status, stable for surgery.  I have reviewed the patient's chart and labs.  Questions were answered to the patient's satisfaction.     Lance MussJayadeep Daimien Patmon

## 2016-02-06 NOTE — H&P (View-Only) (Signed)
Cardiology Office Note    Date:  02/05/2016   ID:  Colton [name]., DOB [birth year], MRN [id number]  PCP:  Colton Givens, MD  Cardiologist: Dr. Mayford [name]  Chief Complaint  Patient presents with  . Follow-up    History of Present Illness:  Colton [name]. is a 60 y.o. male with a history of DCM, remote tobacco use, hyperlipidemia, CVA and discoid lupus  He suffered a strokelike episode in June 2017 treated with IV TPA and made full recovery with no CVA on MRI.  2D echo at that time showed LV dysfunction with EF 35-40% with elevated LDL at 82.  He was started on ASA and statin but stopped the statin due to fear of side effects.  He was seen by his PCP recently and was complaining of increased fatigue.  He says that he occasionally has a heavy feeling in his chest off and on for several years and even mentioned that he though it was angina.  He never told anyone.  It is located midsternal but sometimes in the axilla.  There is no associated diaphoresis, SOB or nausea with the pain.  He does have a remote history of tobacco use.  They are exertional and nonexertional but expecially with anxiety.  He occasionally will have some DOE with certain activities.  He denies any  LE edema, palpitations or syncope.  He has had problems with dizziness since his CVA.  He is now here for further evaluation of LV dysfunction.  There is no family history of DCM or sudden cardiac death.     With dilated cardiomyopathy EF 35-40% by recent echo nuclear stress test was ordered. He had moderate decrease in LVEF 30-44% findings consistent with prior MI with a defect in the inferior, inferolateral wall consistent with scar and/or soft tissue attenuation LVEF 30%.   Dr. Mayford [name] then ordered a CTA which showed a calcium score of 19. This was 37 percentile for age and sex matched control. Normal origin. Left dominance. Mild to moderate diffuse mixed plaque in the proximal LAD, moderate and possibly severe noncalcified  plaque in the mid circumflex with high risk features. A FFR was ordered on the patient circumflex and came back less than 0.7 consistent with significant stenosis. Dr. Mayford [name] spoke with the patient and recommended cardiac catheterization. He expressed concern because of his prior CVA in June. She cited a 1 in 2000 risk of CVA. Patient has a very small nondominant RCA and a large circumflex that supplies a large area of myocardium with a very tight lesion and if this closes down he could have a significant MI with worsening LV dysfunction which he may not survive. He agreed to proceed with left heart catheterization is here today to discuss.  Patient without recent symptoms of chest tightness. He had many questions surrounding heart catheterization which I covered in detail with him.   Past Medical History:  Diagnosis Date  . Depressed left ventricular ejection fraction   . Discoid lupus   . Fatty liver   . Former smoker   . Gout 07/2001   [facility] [name]  . Hyperlipidemia   . Rosacea   . Stroke [facility])    s/p tpa 2017 w/o residual defecit    Past Surgical History:  Procedure Laterality Date  . Cardiolite  08/16/2008   [facility]    Current Medications: Outpatient Medications Prior to Visit  Medication Sig Dispense Refill  . aspirin 325 MG tablet Take 1 tablet (325 mg total)  by mouth daily. 30 tablet 2  . atorvastatin (LIPITOR) 20 MG tablet Take 1 tablet (20 mg total) by mouth daily. 30 tablet 11  . carvedilol (COREG) 12.5 MG tablet Take 1 tablet (12.5 mg total) by mouth 2 (two) times daily. 60 tablet 11  . cephALEXin (KEFLEX) 500 MG capsule Take 1 capsule (500 mg total) by mouth 2 (two) times daily. Take for 10 days as needed for rosacea.  Disp 2 courses 40 capsule 0  . colchicine 0.6 MG tablet Take 1 tablet (0.6 mg total) by mouth daily as needed (gout). Take up to tid for the next couple of days for pain relief post hospitalization 30 tablet 0  . indomethacin (INDOCIN) 50 MG  capsule Take 1 capsule (50 mg total) by mouth 2 (two) times daily with a meal. As needed for gout 60 capsule 0  . losartan (COZAAR) 50 MG tablet Take 1 tablet (50 mg total) by mouth daily. 30 tablet 11  . spironolactone (ALDACTONE) 25 MG tablet Take 1 tablet (25 mg total) by mouth daily. 30 tablet 11  . Tetrahydrozoline HCl (VISINE OP) Place 1 drop into both eyes as needed (for dry eyes).     No facility-administered medications prior to visit.      Allergies:   Corticosteroids and Latex   Social History   Social History  . Marital status: Single    Spouse name: N/A  . Number of children: 0  . Years of education: N/A   Occupational History  . Property Patents/Inventions/Patents    Social History Main Topics  . Smoking status: Former Smoker    Packs/day: 2.00    Years: 30.00    Types: Cigarettes  . Smokeless tobacco: Never Used  . Alcohol use 1.2 oz/week    1 Glasses of wine, 1 Shots of liquor per week     Comment: occasionally  . Drug use: No  . Sexual activity: Not Asked   Other Topics Concern  . 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   ROS:   Please see the history of present illness.    Review of Systems  Constitution: Positive for weakness and malaise/fatigue.  HENT: Negative.   Cardiovascular: Positive for chest pain.  Respiratory: Negative.   Endocrine: Negative.   Hematologic/Lymphatic: Negative.   Musculoskeletal: Negative.   Gastrointestinal: Negative.   Genitourinary: Negative.   Neurological: Positive for numbness and paresthesias.   All other systems reviewed and are negative.   PHYSICAL EXAM:   VS:  BP 124/88   Pulse 75   Ht 6' (1.829 m)   Wt 284 lb (128.8 kg)   BMI 38.52 kg/m   Physical Exam  GEN:  Obese, in no acute distress  HEENT: normal  Neck: no JVD, carotid bruits, or masses Cardiac:RRR; no murmurs, rubs, or gallops  Respiratory:  clear to auscultation bilaterally, normal work of breathing GI: soft, nontender, nondistended, + BS Ext: without cyanosis, clubbing, or edema, Good distal pulses bilaterally MS: no deformity or atrophy  Skin: warm and dry, no rash Neuro:  Alert and Oriented x 3, Strength and sensation are intact Psych: euthymic mood, full affect  Wt Readings from Last 3 Encounters:  02/05/16 284 lb (128.8 kg)  01/23/16 287 lb 8 oz (130.4 kg)  01/17/16 285 lb (129.3 kg)      Studies/Labs Reviewed:   EKG:  EKG is ordered today.  The ekg ordered today demonstrates Normal sinus rhythm nonspecific T-wave abnormality laterally T-wave inversion in V6 new from last EKG tracing in June  Recent Labs: 10/28/2015: ALT 20; Hemoglobin 14.7; Platelets 110 12/18/2015: TSH 0.93 01/23/2016: BUN 21; Creat 1.17; Potassium 4.6; Sodium 137   Lipid Panel    Component Value Date/Time   CHOL 167 10/29/2015 0342   TRIG 303 (H) 10/29/2015 0342   HDL 24 (L) 10/29/2015 0342   CHOLHDL 7.0 10/29/2015 0342   VLDL 61 (H) 10/29/2015 0342   LDLCALC 82 10/29/2015 0342   LDLDIRECT 104.4 05/12/2008 1304    Additional studies/ records that were reviewed today include:   CTA 01/31/16 IMPRESSION: 1. Coronary calcium score of 19. This was 37 percentile for age and sex matched control.   2. Normal origin.  Left dominance.   3.  Mild to moderate diffuse mixed plaque in proximal LAD.   4. Moderate and possibly severe non-calcified plaque in the mid LCX with high risk features. We will try to evaluate with CT FFR.   Tobias Alexander   nuclear stress test 01/18/16 Study Highlights   The left ventricular ejection fraction is moderately decreased (30-44%).  Nuclear stress EF: 30%.  There was no ST segment deviation noted during stress.  Findings consistent with prior myocardial  infarction.  This is a high risk study.  Defect in the inferior, inferolateral wall consistent with scar and/or soft tissue attenuation. LVEF 30%     2-D echo 10/31/15 Study Conclusions   - Left ventricle: The cavity size was moderately dilated. Wall   thickness was normal. Systolic function was moderately reduced.   The estimated ejection fraction was in the range of 35% to 40%.   Features are consistent with a pseudonormal left ventricular   filling pattern, with concomitant abnormal relaxation and   increased filling pressure (grade 2 diastolic dysfunction).   Carotid Dopplers 10/30/15 Summary: There is no obvious evidence of hemodynamically significant internal carotid artery stenosis bilaterally. Vertebral arteries are patent with antegrade flow.   Other specific details can be found in the table(s) above. Prepared and Electronically Authenticated by   Delia Heady MD 2017-06-06T08:28:54   ASSESSMENT:    1. Other chest pain   2. Cardiomyopathy (HCC)   3. Essential hypertension   4. Obesity   5. Tobacco use disorder   6. Hyperlipidemia   7. Stroke-like episode (HCC) s/p IV tPA      PLAN:  In order of problems listed above:  Chest pain with abnormal nuclear stress test and CTA with reduced FFR of less than 0.7 consistent with significant stenosis in the circumflex. Has a very small nondominant RCA and large circumflex that supplies a large area of myocardium with a very tight lesion. Dr. Mayford [name] recommends left heart catheterization and possible PCI. She explained the risk of CVAs 1 in 2000 because of his recent CVA in June. She feels the risk of a MI is more significant. Patient is agreeable to proceed. I have reviewed the risks, indications, and alternatives to angioplasty and stenting with the patient. Risks include but are not limited to bleeding, infection, vascular injury, stroke, myocardial infection, arrhythmia, kidney injury, radiation-related injury in the case  of prolonged fluoroscopy use, emergency cardiac surgery, and death. The patient understands the risks of serious complication is low (<1%) and he agrees  to proceed.   Cardiomyopathy ejection fraction 30% on nuclear stress test, 35-40% on 2-D echo in June 2017. No evidence of heart failure  Essential hypertension controlled  Obesity weight loss recommended  Tobacco abuse patient quit smoking 3 months ago  Hyperlipidemia on Lipitor 20 mg daily  Stroke in June 2017 treated with TPA     Medication Adjustments/Labs and Tests Ordered: Current medicines are reviewed at length with the patient today.  Concerns regarding medicines are outlined above.  Medication changes, Labs and Tests ordered today are listed in the Patient Instructions below. Patient Instructions  Medication Instructions:   Your physician recommends that you continue on your current medications as directed. Please refer to the Current Medication list given to you today.   If you need a refill on your cardiac medications before your next appointment, please call your pharmacy.   Labwork:  CMET CBC AND PTT   Testing/Procedures: SEE LETTER FOR CATH    Follow-Up: IN 2 WEEKS  AFTER POST 02/06/16  CATH WITH AN AVAILABLE EXTENDER  DR TURNER IN 2 MONTHS IF AVAILABLE IF NOT FIRST AVAILABLE   Any Other Special Instructions Will Be Listed Below (If Applicable).                                                                                                                                                      Elson Clan, PA-C  02/05/2016 11:49 AM    [facility] [address] [place], [place], [place]  [address] Phone: [phone]; Fax: [phone]

## 2016-02-07 ENCOUNTER — Other Ambulatory Visit: Payer: Self-pay

## 2016-02-07 ENCOUNTER — Other Ambulatory Visit: Payer: Self-pay | Admitting: Cardiology

## 2016-02-07 DIAGNOSIS — I420 Dilated cardiomyopathy: Secondary | ICD-10-CM

## 2016-02-07 DIAGNOSIS — I209 Angina pectoris, unspecified: Secondary | ICD-10-CM

## 2016-02-07 DIAGNOSIS — I255 Ischemic cardiomyopathy: Secondary | ICD-10-CM

## 2016-02-07 DIAGNOSIS — I2510 Atherosclerotic heart disease of native coronary artery without angina pectoris: Secondary | ICD-10-CM

## 2016-02-07 DIAGNOSIS — E785 Hyperlipidemia, unspecified: Secondary | ICD-10-CM

## 2016-02-07 DIAGNOSIS — Z955 Presence of coronary angioplasty implant and graft: Secondary | ICD-10-CM

## 2016-02-07 LAB — CBC
HCT: 41.8 % (ref 39.0–52.0)
HEMOGLOBIN: 13.7 g/dL (ref 13.0–17.0)
MCH: 28.6 pg (ref 26.0–34.0)
MCHC: 32.8 g/dL (ref 30.0–36.0)
MCV: 87.3 fL (ref 78.0–100.0)
Platelets: 111 10*3/uL — ABNORMAL LOW (ref 150–400)
RBC: 4.79 MIL/uL (ref 4.22–5.81)
RDW: 13.5 % (ref 11.5–15.5)
WBC: 9.3 10*3/uL (ref 4.0–10.5)

## 2016-02-07 LAB — BASIC METABOLIC PANEL
ANION GAP: 7 (ref 5–15)
BUN: 14 mg/dL (ref 6–20)
CALCIUM: 8.8 mg/dL — AB (ref 8.9–10.3)
CO2: 23 mmol/L (ref 22–32)
Chloride: 106 mmol/L (ref 101–111)
Creatinine, Ser: 1.14 mg/dL (ref 0.61–1.24)
GLUCOSE: 116 mg/dL — AB (ref 65–99)
Potassium: 4.2 mmol/L (ref 3.5–5.1)
Sodium: 136 mmol/L (ref 135–145)

## 2016-02-07 MED ORDER — ASPIRIN 81 MG PO CHEW: 81.0000 mg | CHEWABLE_TABLET | Freq: Every day | ORAL | Status: DC

## 2016-02-07 MED ORDER — ATORVASTATIN CALCIUM 20 MG PO TABS: 20.0000 mg | ORAL_TABLET | Freq: Every day | ORAL | Status: DC

## 2016-02-07 MED ORDER — SPIRONOLACTONE 25 MG PO TABS: 12.5000 mg | ORAL_TABLET | Freq: Every day | ORAL | 11 refills | Status: DC

## 2016-02-07 MED ORDER — NITROGLYCERIN 0.4 MG SL SUBL: 0.4000 mg | SUBLINGUAL_TABLET | SUBLINGUAL | 2 refills | Status: DC | PRN

## 2016-02-07 MED ORDER — NITROGLYCERIN 0.4 MG SL SUBL: 0.4000 mg | SUBLINGUAL_TABLET | SUBLINGUAL | Status: DC | PRN

## 2016-02-07 MED ORDER — FUROSEMIDE 20 MG PO TABS: 20.0000 mg | ORAL_TABLET | Freq: Every day | ORAL | 3 refills | Status: DC

## 2016-02-07 MED ORDER — CLOPIDOGREL BISULFATE 75 MG PO TABS: 75.0000 mg | ORAL_TABLET | Freq: Every day | ORAL | 3 refills | Status: DC

## 2016-02-07 MED ORDER — ATORVASTATIN CALCIUM 80 MG PO TABS: 80.0000 mg | ORAL_TABLET | Freq: Every day | ORAL | Status: DC

## 2016-02-07 MED ORDER — FUROSEMIDE 20 MG PO TABS: 20.0000 mg | ORAL_TABLET | Freq: Every day | ORAL | Status: DC

## 2016-02-07 MED ORDER — SPIRONOLACTONE 25 MG PO TABS: 12.5000 mg | ORAL_TABLET | Freq: Every day | ORAL | Status: DC

## 2016-02-07 MED FILL — Adenosine IV Soln 12 MG/4ML: INTRAVENOUS | Qty: 16 | Status: AC

## 2016-02-07 NOTE — Discharge Instructions (Signed)
Coronary Angiogram With Stent, Care After °Refer to this sheet in the next few weeks. These instructions provide you with information about caring for yourself after your procedure. Your health care provider may also give you more specific instructions. Your treatment has been planned according to current medical practices, but problems sometimes occur. Call your health care provider if you have any problems or questions after your procedure. °WHAT TO EXPECT AFTER THE PROCEDURE  °After your procedure, it is typical to have the following: °· Bruising at the catheter insertion site that usually fades within 1-2 weeks. °· Blood collecting in the tissue (hematoma) that may be painful to the touch. It should usually decrease in size and tenderness within 1-2 weeks. °HOME CARE INSTRUCTIONS °· Take medicines only as directed by your health care provider. Blood thinners may be prescribed after your procedure to improve blood flow through the stent. °· You may shower 24-48 hours after the procedure or as directed by your health care provider. Remove the bandage (dressing) and gently wash the catheter insertion site with plain soap and water. Pat the area dry with a clean towel. Do not rub the site, because this may cause bleeding. °· Do not take baths, swim, or use a hot tub until your health care provider approves. °· Check your catheter insertion site every day for redness, swelling, or drainage. °· Do not apply powder or lotion to the site. °· Do not lift over 10 lb (4.5 kg) for 5 days after your procedure or as directed by your health care provider. °· Ask your health care provider when it is okay to: °¨ Return to work or school. °¨ Resume usual physical activities or sports. °¨ Resume sexual activity. °· Eat a heart-healthy diet. This should include plenty of fresh fruits and vegetables. Meat should be lean cuts. Avoid the following types of food: °¨ Food that is high in salt. °¨ Canned or highly processed food. °¨ Food  that is high in saturated fat or sugar. °¨ Fried food. °· Make any other lifestyle changes as recommended by your health care provider. These may include: °¨ Not using any tobacco products, including cigarettes, chewing tobacco, or electronic cigarettes. If you need help quitting, ask your health care provider. °¨ Managing your weight. °¨ Getting regular exercise. °¨ Managing your blood pressure. °¨ Limiting your alcohol intake. °¨ Managing other health problems, such as diabetes. °· If you need an MRI after your heart stent has been placed, be sure to tell the health care provider who orders the MRI that you have a heart stent. °· Keep all follow-up visits as directed by your health care provider. This is important. °SEEK MEDICAL CARE IF: °· You have a fever. °· You have chills. °· You have increased bleeding from the catheter insertion site. Hold pressure on the site. °SEEK IMMEDIATE MEDICAL CARE IF: °· You develop chest pain or shortness of breath, feel faint, or pass out. °· You have unusual pain at the catheter insertion site. °· You have redness, warmth, or swelling at the catheter insertion site. °· You have drainage (other than a small amount of blood on the dressing) from the catheter insertion site. °· The catheter insertion site is bleeding, and the bleeding does not stop after 30 minutes of holding steady pressure on the site. °· You develop bleeding from any other place, such as from your rectum. There may be bright red blood in your urine or stool, or it may appear as black, tarry stool. °  °  This information is not intended to replace advice given to you by your health care provider. Make sure you discuss any questions you have with your health care provider. °  °Document Released: 11/30/2004 Document Revised: 06/03/2014 Document Reviewed: 10/05/2012 °Elsevier Interactive Patient Education ©2016 Elsevier Inc. ° °

## 2016-02-07 NOTE — Progress Notes (Addendum)
CARDIAC REHAB PHASE I   PRE:  Rate/Rhythm: 82 SR    BP: sitting 127/83    SaO2:   MODE:  Ambulation: 500 ft   POST:  Rate/Rhythm: 99 SR    BP: sitting 129/65     SaO2:   Tolerated well. Pt sts his legs aren't hurting like they previously have been. Also denied fatigue. Ed completed with good reception. Understands importance of Plavix/ASA. Pt is motivated to change, wanting to work on diet and ex. He quit smoking without any problems in June. Very interested in [facility] and will send referral to [place]. [phone]   Colton [name] CES, ACSM 02/07/2016 9:22 AM

## 2016-02-07 NOTE — Discharge Summary (Signed)
Patient ID: Colton [name].,  MRN: [id number], DOB/AGE: [birth year] 60 y.o.  Admit date: 02/06/2016 Discharge date: 02/07/2016  Primary Care Provider: Crawford Givens, MD Primary Cardiologist: Dr Mayford Knife   Discharge Diagnoses Active Problems:   Angina pectoris [facility])   Status post coronary artery stent placement   DCM (dilated cardiomyopathy) (HCC)   Hyperlipidemia with target LDL less than 70   Coronary artery disease due to lipid rich plaque    Procedures: Cath CFX PCI/DES 02/06/16   Hospital Course:  60 y/o overweight Caucasian male with a history of a stroke in June 2017 treated with IV TPA. He apparently  and made full recovery with no CVA on MRI. 2D echo at that time showed LV dysfunction with EF 35-40% with elevated LDL at 82. He was started on ASA and statin but stopped the statin due to fear of side effects. With his dilated cardiomyopathy a nuclear stress test was done 01/18/16. He had moderate decrease in LVEF- 30-44%.  Findings were consistent with prior MI with a defect in the inferior, inferolateral wall consistent with scar. His LVEF was 30% by Myoview. Dr. Mayford Knife then ordered a CTA -done 01/31/16-which showed mild to moderate diffuse mixed plaque in the proximal LAD and moderate and possibly severe noncalcified plaque in the mid circumflex with high risk features. A FFR was ordered on the patient circumflex and came back less than 0.7 consistent with significant stenosis. The pt was admitted for diagnostic cath 02/06/16. This revealed an 80% mid CFX lesion, treated with a DES, and a 60% LAD lesion, not significant by FFR. The pt tolerated this well and is stable for discharge 02/07/16. He'll need a TOC OV in 7-14 days with plans to up titrate his medications for LVD. The pt declined high dose statin Rx. He'll need a f/u echo 2 months (ordered).   Discharge Vitals:  Blood pressure 127/83, pulse 83, temperature 97.7 F (36.5 C), temperature source Oral, resp. rate 17, height  6' (1.829 m), weight 286 lb 9.6 oz (130 kg), SpO2 95 %.    Labs: Results for orders placed or performed during the hospital encounter of 02/06/16 (from the past 24 hour(s))  Basic metabolic panel     Status: Abnormal   Collection Time: 02/07/16  4:17 AM  Result Value Ref Range   Sodium 136 135 - 145 mmol/L   Potassium 4.2 3.5 - 5.1 mmol/L   Chloride 106 101 - 111 mmol/L   CO2 23 22 - 32 mmol/L   Glucose, Bld 116 (H) 65 - 99 mg/dL   BUN 14 6 - 20 mg/dL   Creatinine, Ser 4.09 0.61 - 1.24 mg/dL   Calcium 8.8 (L) 8.9 - 10.3 mg/dL   GFR calc non Af Amer >60 >60 mL/min   GFR calc Af Amer >60 >60 mL/min   Anion gap 7 5 - 15  CBC     Status: Abnormal   Collection Time: 02/07/16  4:17 AM  Result Value Ref Range   WBC 9.3 4.0 - 10.5 K/uL   RBC 4.79 4.22 - 5.81 MIL/uL   Hemoglobin 13.7 13.0 - 17.0 g/dL   HCT 81.1 91.4 - 78.2 %   MCV 87.3 78.0 - 100.0 fL   MCH 28.6 26.0 - 34.0 pg   MCHC 32.8 30.0 - 36.0 g/dL   RDW 95.6 21.3 - 08.6 %   Platelets 111 (L) 150 - 400 K/uL    Disposition:  Follow-up Information    Armanda Magic, MD .  Specialty:  Cardiology Why:  office will contact you Contact information: [address] [place] [address] [phone]           Discharge Medications:    Medication List    STOP taking these medications   aspirin 325 MG tablet Replaced by:  aspirin 81 MG chewable tablet   indomethacin 50 MG capsule Commonly known as:  INDOCIN     TAKE these medications   aspirin 81 MG chewable tablet Chew 1 tablet (81 mg total) by mouth daily. Start taking on:  02/08/2016 Replaces:  aspirin 325 MG tablet   atorvastatin 20 MG tablet Commonly known as:  LIPITOR Take 1 tablet (20 mg total) by mouth daily.   carvedilol 12.5 MG tablet Commonly known as:  COREG Take 1 tablet (12.5 mg total) by mouth 2 (two) times daily.   clopidogrel 75 MG tablet Commonly known as:  PLAVIX Take 1 tablet (75 mg total) by mouth daily with  breakfast. Start taking on:  02/08/2016   colchicine 0.6 MG tablet Take 1 tablet (0.6 mg total) by mouth daily as needed (gout). Take up to tid for the next couple of days for pain relief post hospitalization What changed:  additional instructions   furosemide 20 MG tablet Commonly known as:  LASIX Take 1 tablet (20 mg total) by mouth daily.   losartan 50 MG tablet Commonly known as:  COZAAR Take 1 tablet (50 mg total) by mouth daily.   nitroGLYCERIN 0.4 MG SL tablet Commonly known as:  NITROSTAT Place 1 tablet (0.4 mg total) under the tongue every 5 (five) minutes as needed for chest pain.   spironolactone 25 MG tablet Commonly known as:  ALDACTONE Take 0.5 tablets (12.5 mg total) by mouth daily. What changed:  how much to take   VISINE OP Place 1 drop into both eyes as needed (for dry eyes).        Duration of Discharge Encounter: Greater than 30 minutes including physician time.  Jolene ProvostSigned, Ayven Glasco PA-C 02/07/2016 11:38 AM

## 2016-02-07 NOTE — Progress Notes (Signed)
SUBJECTIVE:  No complaints.  Eager to go home   OBJECTIVE:   Vitals:   Vitals:   02/06/16 1952 02/06/16 2000 02/07/16 0412 02/07/16 0815  BP: 122/61  130/77 127/83  Pulse: 81 79 75 83  Resp: (!) 22 18 (!) 21 17  Temp: 98.4 F (36.9 C)  97.8 F (36.6 C) 97.7 F (36.5 C)  TempSrc: Oral  Oral Oral  SpO2: 99% 97% 95% 95%  Weight:   286 lb 9.6 oz (130 kg)   Height:       I&O's:   Intake/Output Summary (Last 24 hours) at 02/07/16 1009 Last data filed at 02/07/16 0427  Gross per 24 hour  Intake              480 ml  Output             2775 ml  Net            -2295 ml   TELEMETRY: Reviewed telemetry pt in NSR:     PHYSICAL EXAM General: Well developed, well nourished, in no acute distress Head: Eyes PERRLA, No xanthomas.   Normal cephalic and atramatic  Lungs:   Clear bilaterally to auscultation and percussion. Heart:   HRRR S1 S2 Pulses are 2+ & equal. Abdomen: Bowel sounds are positive, abdomen soft and non-tender without masses  Msk:  Back normal, normal gait. Normal strength and tone for age. Extremities:   No clubbing, cyanosis or edema.  DP +1 Neuro: Alert and oriented X 3. Psych:  Good affect, responds appropriately   LABS: Basic Metabolic Panel:  Recent Labs  16/02/9608/11/17 1158 02/07/16 0417  NA 138 136  K 4.7 4.2  CL 105 106  CO2 24 23  GLUCOSE 109* 116*  BUN 19 14  CREATININE 1.19 1.14  CALCIUM 9.1 8.8*   Liver Function Tests:  Recent Labs  02/05/16 1158  AST 18  ALT 23  ALKPHOS 55  BILITOT 0.5  PROT 7.1  ALBUMIN 4.3   No results for input(s): LIPASE, AMYLASE in the last 72 hours. CBC:  Recent Labs  02/05/16 1158 02/07/16 0417  WBC 8.4 9.3  HGB 14.3 13.7  HCT 42.2 41.8  MCV 86.8 87.3  PLT 129* 111*   Cardiac Enzymes: No results for input(s): CKTOTAL, CKMB, CKMBINDEX, TROPONINI in the last 72 hours. BNP: Invalid input(s): POCBNP D-Dimer: No results for input(s): DDIMER in the last 72 hours. Hemoglobin A1C: No results for  input(s): HGBA1C in the last 72 hours. Fasting Lipid Panel: No results for input(s): CHOL, HDL, LDLCALC, TRIG, CHOLHDL, LDLDIRECT in the last 72 hours. Thyroid Function Tests: No results for input(s): TSH, T4TOTAL, T3FREE, THYROIDAB in the last 72 hours.  Invalid input(s): FREET3 Anemia Panel: No results for input(s): VITAMINB12, FOLATE, FERRITIN, TIBC, IRON, RETICCTPCT in the last 72 hours. Coag Panel:   Lab Results  Component Value Date   INR 1.09 02/06/2016   INR 1.13 10/28/2015   INR 1.04 11/17/2010    RADIOLOGY: Ct Coronary Morph W/cta Cor W/score W/ca W/cm &/or Wo/cm  Addendum Date: 01/31/2016   ADDENDUM REPORT: 01/31/2016 17:38 CLINICAL DATA:  Chest pain EXAM: Cardiac/Coronary  CT TECHNIQUE: The patient was scanned on a Philips 256 scanner. FINDINGS: A 120 kV prospective scan was triggered in the descending thoracic aorta at 111 HU's. Axial non-contrast 3 mm slices were carried out through the heart. The data set was analyzed on a dedicated work station and scored using the Agatson method. Gantry rotation speed was 270  msecs and collimation was .9 mm. 15 mg of iv Metoprolol and 0.8 mg of sl NTG was given. The 3D data set was reconstructed in 5% intervals of the 67-82 % of the R-R cycle. Diastolic phases were analyzed on a dedicated work station using MPR, MIP and VRT modes. The patient received 80 cc of contrast. Aorta: Aortic Valve:  Trileaflet.  No calcifications. Coronary Arteries:  Normal origin.  Left dominance. Left main is a large and long vessel with no evidence of stenosis. LAD is a large caliber long vessel that wraps around the apex and gives rise to two small diagonal branches. There is a long mixed plaque in the proximal LAD associated with 25-50% and focally moderate plaque with associated 50-69% stenosis. Two diagonal branches are small and don't have significant plaque. LCX artery is a large caliber dominant vessel that gives rise to two obtuse marginal branches, PDA and PLA.  Mid LCX artery has a moderate to severe non-calcified plaque with associated stenosis at least 50-69%. Prior to the stenosis there is aneurysmal dilatation. The lesion has a high risk feature - napkin ring. Mild plaque in obtuse marginal branches. RCA is a small non-dominant vessel with mild calcified plaque in the mid segment. IMPRESSION: 1. Coronary calcium score of 19. This was 37 percentile for age and sex matched control. 2. Normal origin.  Left dominance. 3.  Mild to moderate diffuse mixed plaque in proximal LAD. 4. Moderate and possibly severe non-calcified plaque in the mid LCX with high risk features. We will try to evaluate with CT FFR. Tobias Alexander Electronically Signed   By: Tobias Alexander   On: 01/31/2016 17:38   Result Date: 01/31/2016 EXAM: OVER-READ INTERPRETATION  CT CHEST The following report is an over-read performed by radiologist Dr. Royal Piedra [facility] on 01/31/2016. This over-read does not include interpretation of cardiac or coronary anatomy or pathology. The coronary calcium score/coronary CTA interpretation by the cardiologist is attached. COMPARISON:  Chest CT 11/18/2010 FINDINGS: Aortic atherosclerosis. Within the visualized portions of the thorax there are no suspicious appearing pulmonary nodules or masses, there is no acute consolidative airspace disease, no pleural effusions and no pneumothorax. Mild to moderate centrilobular and paraseptal emphysema is noted throughout the mid to upper lungs. There multiple prominent borderline enlarged mediastinal lymph nodes which are nonspecific, but similar to remote prior chest CT 11/18/2010, presumably benign. Visualized portions of the upper abdomen demonstrate mild diffuse low attenuation throughout the hepatic parenchyma, compatible with hepatic steatosis. There are no aggressive appearing lytic or blastic lesions noted in the visualized portions of the skeleton. IMPRESSION: 1. Aortic atherosclerosis. 2. Hepatic  steatosis. 3. Mild moderate centrilobular and paraseptal emphysema. Electronically Signed: By: Trudie Reed M.D. On: 01/31/2016 11:18      ASSESSMENT: PLAN:   1.  Chest pain with abnormal nuclear stress test and CTA with reduced FFR of less than 0.7 in mid Left Circ consistent with significant stenosis in the circumflex. Has a very small nondominant RCA and large circumflex that supplies a large area of myocardium with a very tight lesion.  There also appears to be moderate LAD disease.  Patient underwent cath yesterday confirming the FFR data on coronary CTA.  Cath showed 60% mild LAD with FFR 0.82 (< 0.8 significant), mild nondominant RCA disease and mid LCx 80% s/p Synergy stent.  He will be on DAPT for 12 months.    2.  ASCAD - see above. Continue DAPT for 12 months.  Needs aggressive secondary  prevention.  Continue lipitor to 20mg  daily.  He does not want to be on a higher dose of statin unless absolutely necessary as his parents both had problems with it.  Continue BB.    3.  Dilated CM with moderate LV dysfunction by echo 10/2015 with EF 35-40%.  Continue BB and ARB.  Add aldactone 12.5mg  daily and Lasix 20mg  daily for mildly elevated LVEDP.  Will need BMET in 1 week and Followup in pharmacy clinic in 1 week for uptitration of CHF meds.  Will repeat 2D echo in 2 months to assess for improvement in LVF.  No LifeVest needed at this time as EF >/= to 35%. .   4.  Hyperlipidemia - LDL goal < 70.  Will increase lipitor to 80mg  daily and repeat FLP and ALT in 6 weeks.   Armanda Magic, MD  02/07/2016  10:09 AM

## 2016-02-10 ENCOUNTER — Encounter: Payer: Self-pay | Admitting: Cardiology

## 2016-02-10 NOTE — Progress Notes (Signed)
      FROS    This encounter was created in error - please disregard.

## 2016-02-12 ENCOUNTER — Encounter: Payer: Self-pay | Admitting: Cardiology

## 2016-02-12 ENCOUNTER — Other Ambulatory Visit: Payer: Self-pay

## 2016-02-12 ENCOUNTER — Telehealth: Payer: Self-pay | Admitting: Cardiology

## 2016-02-12 NOTE — Telephone Encounter (Signed)
Pt offered appt tomorrow with Lawson FiscalLori or Thursday 9/21 with Dr Mayford Knifeurner.   Pt states he would like to go ahead and see Lawson FiscalLori 02/13/16, this has been done.

## 2016-02-12 NOTE — Telephone Encounter (Signed)
Pt states he was unaware of appt today with Dr Mayford Knifeurner, pt thought appt was tomorrow.

## 2016-02-12 NOTE — Telephone Encounter (Signed)
New message     Pt states that the doctor was suppose to move the appt to tomorrow but it is still scheduled for today at 11 am. I offered him an appt wit Dunn on 9/26 but he declined. He would like to be worked in on tomorrow since he states that is what the doctor told him. Please call.

## 2016-02-13 ENCOUNTER — Ambulatory Visit (INDEPENDENT_AMBULATORY_CARE_PROVIDER_SITE_OTHER): Payer: Self-pay | Admitting: Nurse Practitioner

## 2016-02-13 ENCOUNTER — Encounter: Payer: Self-pay | Admitting: Nurse Practitioner

## 2016-02-13 VITALS — BP 138/80 | HR 84 | Resp 99 | Ht 72.0 in | Wt 280.1 lb

## 2016-02-13 DIAGNOSIS — I420 Dilated cardiomyopathy: Secondary | ICD-10-CM

## 2016-02-13 DIAGNOSIS — E785 Hyperlipidemia, unspecified: Secondary | ICD-10-CM

## 2016-02-13 DIAGNOSIS — I10 Essential (primary) hypertension: Secondary | ICD-10-CM

## 2016-02-13 DIAGNOSIS — Z955 Presence of coronary angioplasty implant and graft: Secondary | ICD-10-CM

## 2016-02-13 LAB — CBC
HCT: 41.9 % (ref 38.5–50.0)
Hemoglobin: 14.2 g/dL (ref 13.2–17.1)
MCH: 29.2 pg (ref 27.0–33.0)
MCHC: 33.9 g/dL (ref 32.0–36.0)
MCV: 86 fL (ref 80.0–100.0)
Platelets: 149 10*3/uL (ref 140–400)
RBC: 4.87 MIL/uL (ref 4.20–5.80)
RDW: 13.9 % (ref 11.0–15.0)
WBC: 9.5 10*3/uL (ref 3.8–10.8)

## 2016-02-13 MED ORDER — LOSARTAN POTASSIUM 100 MG PO TABS: 100.0000 mg | ORAL_TABLET | Freq: Every day | ORAL | 3 refills | Status: DC

## 2016-02-13 MED ORDER — FUROSEMIDE 20 MG PO TABS: 20.0000 mg | ORAL_TABLET | Freq: Every day | ORAL | 3 refills | Status: DC | PRN

## 2016-02-13 NOTE — Progress Notes (Signed)
CARDIOLOGY OFFICE NOTE  Date:  02/13/2016    [name] Date of Birth: 04/28/1956 Medical Record #[id number]  PCP:  Crawford Givens, MD  Cardiologist:  Mayford Knife  Chief Complaint  Patient presents with  . Coronary Artery Disease    Post hospital visit - seen for Dr. Mayford Knife    History of Present Illness: Colton [name]. is a 60 y.o. male who presents today for a post hospital visit. Seen for Dr. Mayford Knife. This was to be a TOC however, no phone call documented.   He has a history ofa stroke in June 2017 treated with IV TPA. He apparently made full recovery with no CVA on MRI. 2D echo at that time showed LV dysfunction with EF 35-40% with elevated LDL at 82. He was started on ASA and statin but stopped the statin due to fear of side effects. With his dilated cardiomyopathy a nuclear stress test was done 01/18/16. He had moderate decrease in LVEF- 30-44%.  Findings were consistent with prior MI with a defect in the inferior, inferolateral wall consistent with scar. His LVEF was 30% by Myoview. Dr. Mayford Knife then ordered a CTA-done 01/31/16-which showed mild to moderate diffuse mixed plaque in the proximal LAD and moderate and possibly severe noncalcified plaque in the mid circumflex with high risk features. A FFRwas ordered on the patient circumflex and came back less than 0.7 consistent with significant stenosis. The pt was admitted for diagnostic cath 02/06/16. This revealed an 80% mid CFX lesion, treated with a DES, and a 60% LAD lesion, not significant by FFR. The pt tolerated this well and is stable for discharge 02/07/16. He'll need a TOC OV in 7-14 days with plans to up titrate his medications for LVD. The pt declined high dose statin Rx. He'll need a f/u echo 2 months (already ordered).   Comes in today. Here alone. Has a gout flare up - seeing Rheumatology tomorrow - Dr. Kathi Ludwig at [facility].  He is wondering if this gout flare up is from his diuretic therapy. He has had  some "itchy chest discomfort" - this comes and goes - very fleeting in nature. Walking a little bit. He says he feels good - feels "healthy". Has more energy now. 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 He is motivated to make changes.   Past Medical History:  Diagnosis Date  . Coronary artery disease    cath 01/2016 with 60% LAD with FFR 0.82 and 80% mid LCX s/p PCI  . Dilated cardiomyopathy (HCC)    EF 35-40% by echo  . Discoid lupus   . Fatty liver   . Former smoker   . Gout 07/2001   [facility]  . History of gout   . Hyperlipidemia LDL goal <70   . Hypertension   . Rosacea   . Stroke [facility])    s/p tpa 2017 w/o residual defecit    Past Surgical History:  Procedure Laterality Date  . CARDIAC CATHETERIZATION N/A 02/06/2016   Procedure: Left Heart Cath and Coronary Angiography;  Surgeon: Corky Crafts, MD;  Location: [facility] INVASIVE CV LAB;  Service: Cardiovascular;  Laterality: N/A;  . CARDIAC CATHETERIZATION N/A 02/06/2016   Procedure: Coronary Stent Intervention;  Surgeon: Corky Crafts, MD;  Location: [facility] INVASIVE CV LAB;  Service: Cardiovascular;  Laterality: N/A;  . CARDIAC CATHETERIZATION N/A 02/06/2016   Procedure: Intravascular Pressure Wire/FFR Study;  Surgeon: Corky Crafts, MD;  Location: [facility] INVASIVE CV LAB;  Service: Cardiovascular;  Laterality: N/A;  . Cardiolite  08/16/2008   [facility]     Medications: Current Outpatient Prescriptions  Medication Sig Dispense Refill  . aspirin 81 MG chewable tablet Chew 1 tablet (81 mg total) by mouth daily.    [name] atorvastatin (LIPITOR) 20 MG tablet Take 1 tablet (20 mg total) by mouth daily. 30 tablet 11  . carvedilol (COREG) 12.5 MG tablet Take 1 tablet (12.5 mg total) by mouth 2 (two) times daily. 60 tablet 11  . clopidogrel (PLAVIX) 75 MG tablet Take 1 tablet (75 mg total) by mouth daily with breakfast. 90 tablet 3  . colchicine 0.6 MG tablet Take 1 tablet (0.6 mg total) by mouth daily as  needed (gout). Take up to tid for the next couple of days for pain relief post hospitalization (Patient taking differently: Take 0.6 mg by mouth daily as needed (gout). ) 30 tablet 0  . furosemide (LASIX) 20 MG tablet Take 1 tablet (20 mg total) by mouth daily as needed. 90 tablet 3  . nitroGLYCERIN (NITROSTAT) 0.4 MG SL tablet Place 1 tablet (0.4 mg total) under the tongue every 5 (five) minutes as needed for chest pain. 25 tablet 2  . spironolactone (ALDACTONE) 25 MG tablet Take 0.5 tablets (12.5 mg total) by mouth daily. 30 tablet 11  . Tetrahydrozoline HCl (VISINE OP) Place 1 drop into both eyes as needed (for dry eyes).    [name] losartan (COZAAR) 100 MG tablet Take 1 tablet (100 mg total) by mouth daily. 90 tablet 3   No current facility-administered medications for this visit.     Allergies: Allergies  Allergen Reactions  . Corticosteroids Other (See Comments)    Muscle spasms  . Latex Rash    Social History: The patient  reports that he quit smoking about 3 months ago. His smoking use included Cigarettes. He has a 90.00 pack-year smoking history. He has never used smokeless tobacco. He reports that he drinks about 1.2 oz of alcohol per week . He reports that he does not use drugs.   Family History: The patient's family history includes Cancer in his other; Heart disease in his father and other.   Review of Systems: Please see the history of present illness.   Otherwise, the review of systems is positive for none.   All other systems are reviewed and negative.   Physical Exam: VS:  BP 138/80   Pulse 84   Resp (!) 99   Ht 6' (1.829 m)   Wt 280 lb 1.9 oz (127.1 kg)   BMI 37.99 kg/m  .  BMI Body mass index is 37.99 kg/m.  Wt Readings from Last 3 Encounters:  02/13/16 280 lb 1.9 oz (127.1 kg)  02/07/16 286 lb 9.6 oz (130 kg)  02/05/16 284 lb (128.8 kg)    General: Pleasant. Well developed, well nourished and in no acute distress.   HEENT: Normal.  Neck: Supple, no JVD,  carotid bruits, or masses noted.  Cardiac: Regular rate and rhythm. No murmurs, rubs, or gallops. No edema.  Respiratory:  Lungs are clear to auscultation bilaterally with normal work of breathing.  GI: Soft and nontender.  MS: No deformity or atrophy. Gait and ROM intact.  Skin: Warm and dry. Color is normal.  Neuro:  Strength and sensation are intact and no gross focal deficits noted.  Psych: Alert, appropriate and with normal affect.   LABORATORY DATA:  EKG:  EKG is not ordered today.  Lab Results  Component Value  Date   WBC 9.3 02/07/2016   HGB 13.7 02/07/2016   HCT 41.8 02/07/2016   PLT 111 (L) 02/07/2016   GLUCOSE 116 (H) 02/07/2016   CHOL 167 10/29/2015   TRIG 303 (H) 10/29/2015   HDL 24 (L) 10/29/2015   LDLDIRECT 104.4 05/12/2008   LDLCALC 82 10/29/2015   ALT 23 02/05/2016   AST 18 02/05/2016   NA 136 02/07/2016   K 4.2 02/07/2016   CL 106 02/07/2016   CREATININE 1.14 02/07/2016   BUN 14 02/07/2016   CO2 23 02/07/2016   TSH 0.93 12/18/2015   PSA 0.67 05/12/2008   INR 1.09 02/06/2016   HGBA1C 6.2 (H) 10/29/2015    BNP (last 3 results) No results for input(s): BNP in the last 8760 hours.  ProBNP (last 3 results) No results for input(s): PROBNP in the last 8760 hours.   Other Studies Reviewed Today:  Procedures   Coronary Stent Intervention 01/2016  Intravascular Pressure Wire/FFR Study  Left Heart Cath and Coronary Angiography  Conclusion     Mid LAD lesion, 60 %stenosed. FFR of this lesion was 0.82, not significant.  Mid Cx lesion, 80 %stenosed. A STENT SYNERGY DES [id number] drug eluting stent was successfully placed. The stent was postdilated to > 4.5 mm in diameter.  Post intervention, there is a 0% residual stenosis.  LV end diastolic pressure is mildly elevated.  There is no aortic valve stenosis.   Continue dual antiplatelet therapy for 12 months, along with aggressive secondary prevention. He will be watched overnight.  Likely discharge in  AM.      Assessment/Plan: 1. CAD with recent PCI with DES to the LCX - on DAPT - doing well clinically. Lab today.  2. Residual CAD with insignificant FFR in the LAD lesion - no active symptoms. Will manage medically and with CV risk factor modification  3. Ischemic CM - on good regimen - needs titration of his medicines - increasing ARB and changing Lasix to just prn. Change echo to 2 months post PCI.   4. Chronic systolic HF - see #3  5. Obesity  6. Prior stroke - recovered nicely.   Current medicines are reviewed with the patient today.  The patient does not have concerns regarding medicines other than what has been noted above.  The following changes have been made:  See above.  Labs/ tests ordered today include:    Orders Placed This Encounter  Procedures  . Basic metabolic panel  . CBC  . ECHOCARDIOGRAM COMPLETE     Disposition:   FU with me in 3 weeks. Echo after November 12th.  Patient is agreeable to this plan and will call if any problems develop in the interim.   Signed: Rosalio MacadamiaLori C. Travares Nelles, RN, ANP-C 02/13/2016 2:41 PM  [facility] [address] [place], [place]  [address] Phone: [phone] Fax: [phone]

## 2016-02-13 NOTE — Research (Signed)
CADLAD Informed Consent   Subject Name: Colton [name].  Subject met inclusion and exclusion criteria.  The informed consent form, study requirements and expectations were reviewed with the subject and questions and concerns were addressed prior to the signing of the consent form.  The subject verbalized understanding of the trail requirements.  The subject agreed to participate in the CADLAD trial and signed the informed consent.  The informed consent was obtained prior to performance of any protocol-specific procedures for the subject.  A copy of the signed informed consent was given to the subject and a copy was placed in the subject's medical record.  Berneda Rose 02/13/2016, 9:37 AM

## 2016-02-13 NOTE — Patient Instructions (Addendum)
We will be checking the following labs today - BMET and CBC   Medication Instructions:    Continue with your current medicines. BUT  I am changing the Lasix to take just as needed - this means for weight gain, swelling, etc.   Increase the Losartan to 100 mg a day - you can take 2 of your 50 mg tablets to = this dose and use up - the RX for the 100 mg is at the drug store.     Testing/Procedures To Be Arranged:  Echo after November 12th  Follow-Up:   See me in about 3 weeks    Other Special Instructions:   Restrict salt  Ok to walk/use stationary bike - goal is 45 to 60 minutes a day    If you need a refill on your cardiac medications before your next appointment, please call your pharmacy.   Call the [facility] office at [phone] if you have any questions, problems or concerns.

## 2016-02-14 LAB — BASIC METABOLIC PANEL
BUN: 20 mg/dL (ref 7–25)
CO2: 28 mmol/L (ref 20–31)
Calcium: 9.2 mg/dL (ref 8.6–10.3)
Chloride: 103 mmol/L (ref 98–110)
Creat: 1.33 mg/dL — ABNORMAL HIGH (ref 0.70–1.25)
Glucose, Bld: 103 mg/dL — ABNORMAL HIGH (ref 65–99)
Potassium: 4.2 mmol/L (ref 3.5–5.3)
Sodium: 139 mmol/L (ref 135–146)

## 2016-02-20 ENCOUNTER — Other Ambulatory Visit (HOSPITAL_COMMUNITY): Payer: Self-pay

## 2016-03-05 NOTE — Progress Notes (Signed)
CARDIOLOGY OFFICE NOTE  Date:  03/06/2016    Colton Bors. Date of Birth: 01-30-1956 Medical Record #[id number]  PCP:  Crawford Givens, MD  Cardiologist:  Mayford Knife    Chief Complaint  Patient presents with  . Coronary Artery Disease  . Cardiomyopathy    Follow up visit - seen for Dr. Mayford Knife    History of Present Illness: Colton Cruzan. is a 60 y.o. male who presents today for a 3 week check. Seen for Dr. Mayford Knife.    He has a history ofa stroke in June 2017 treated with IV TPA. He apparently made full recovery with no CVA on MRI. 2D echo at that time showed LV dysfunction with EF 35-40% with elevated LDL at 82. He was started on ASA and statin but stopped the statin due to fear of side effects. With his dilated cardiomyopathy anuclear stress test was done 01/18/16. He had moderate decrease in LVEF-30-44%. Findings were consistent with prior MI with a defect in the inferior, inferolateral wall consistent with scar. HisLVEF was 30% by Myoview. Dr. Mayford Knife then ordered a CTA-done 01/31/16-which showed mild to moderate diffuse mixed plaque in the proximal LAD and moderate and possibly severe noncalcified plaque in the mid circumflex with high risk features. A FFRwas ordered on the patient circumflex and came back less than 0.7 consistent with significant stenosis. The pt was admitted for diagnostic cath 02/06/16. This revealed an 80% mid CFX lesion, treated with a DES, and a 60% LAD lesion, not significant by FFR. The pt tolerated this well and is stable for discharge 02/07/16. He'll need a TOC OV in 7-14 days with plans to up titrate his medications for LVD. The pt declined high dose statin Rx.   I saw him 3 weeks ago for a post hospital visit - he was doing well. Had had a gout flare. We increased his ARB - trying to maximize his CHF regimen. Needs echo 2 mos post PCI.   Comes in today. Here alone. Doing well. He says he feels the best he has in years. No chest pain.  Breathing is good. Not dizzy. Very reluctant to increase his medicines further. Echo in about 1 month planned. Only issue today is a rash - does not want to see the dermatologist but asking for Keflex - this has helped him in the past.   Past Medical History:  Diagnosis Date  . Coronary artery disease    cath 01/2016 with 60% LAD with FFR 0.82 and 80% mid LCX s/p PCI  . Dilated cardiomyopathy (HCC)    EF 35-40% by echo  . Discoid lupus   . Fatty liver   . Former smoker   . Gout 07/2001   [facility]  . History of gout   . Hyperlipidemia LDL goal <70   . Hypertension   . Rosacea   . Stroke [facility])    s/p tpa 2017 w/o residual defecit    Past Surgical History:  Procedure Laterality Date  . CARDIAC CATHETERIZATION N/A 02/06/2016   Procedure: Left Heart Cath and Coronary Angiography;  Surgeon: Corky Crafts, MD;  Location: [facility] INVASIVE CV LAB;  Service: Cardiovascular;  Laterality: N/A;  . CARDIAC CATHETERIZATION N/A 02/06/2016   Procedure: Coronary Stent Intervention;  Surgeon: Corky Crafts, MD;  Location: [facility] INVASIVE CV LAB;  Service: Cardiovascular;  Laterality: N/A;  . CARDIAC CATHETERIZATION N/A 02/06/2016   Procedure: Intravascular Pressure Wire/FFR Study;  Surgeon: Corky Crafts, MD;  Location: [facility] INVASIVE  CV LAB;  Service: Cardiovascular;  Laterality: N/A;  . Cardiolite  08/16/2008   [facility]     Medications: Current Outpatient Prescriptions  Medication Sig Dispense Refill  . allopurinol (ZYLOPRIM) 100 MG tablet one tablet daily  6  . aspirin 81 MG chewable tablet Chew 1 tablet (81 mg total) by mouth daily.    [name]. atorvastatin (LIPITOR) 20 MG tablet Take 1 tablet (20 mg total) by mouth daily. 30 tablet 11  . carvedilol (COREG) 12.5 MG tablet Take 1 tablet (12.5 mg total) by mouth 2 (two) times daily. 60 tablet 11  . clopidogrel (PLAVIX) 75 MG tablet Take 1 tablet (75 mg total) by mouth daily with breakfast. 90 tablet 3  . colchicine 0.6 MG tablet  Take 1 tablet (0.6 mg total) by mouth daily as needed (gout). 30 tablet 6  . furosemide (LASIX) 20 MG tablet Take 1 tablet (20 mg total) by mouth daily as needed. 90 tablet 3  . losartan (COZAAR) 100 MG tablet Take 1 tablet (100 mg total) by mouth daily. 90 tablet 3  . nitroGLYCERIN (NITROSTAT) 0.4 MG SL tablet Place 1 tablet (0.4 mg total) under the tongue every 5 (five) minutes as needed for chest pain. 25 tablet 2  . spironolactone (ALDACTONE) 25 MG tablet Take 0.5 tablets (12.5 mg total) by mouth daily. 30 tablet 11  . Tetrahydrozoline HCl (VISINE OP) Place 1 drop into both eyes as needed (for dry eyes).    . cephALEXin (KEFLEX) 250 MG capsule Take 1 capsule (250 mg total) by mouth 4 (four) times daily. 30 capsule 0   No current facility-administered medications for this visit.     Allergies: Allergies  Allergen Reactions  . Corticosteroids Other (See Comments)    Muscle spasms  . Latex Rash    Social History: The patient  reports that he quit smoking about 4 months ago. His smoking use included Cigarettes. He has a 90.00 pack-year smoking history. He has never used smokeless tobacco. He reports that he drinks about 1.2 oz of alcohol per week . He reports that he does not use drugs.   Family History: The patient's family history includes Cancer in his other; Heart disease in his father and other.   Review of Systems: Please see the history of present illness.   Otherwise, the review of systems is positive for none.   All other systems are reviewed and negative.   Physical Exam: VS:  BP 140/82   Pulse 77   Ht 6' (1.829 m)   Wt 288 lb 1.9 oz (130.7 kg)   SpO2 97% Comment: at rest  BMI 39.08 kg/m  .  BMI Body mass index is 39.08 kg/m.  Wt Readings from Last 3 Encounters:  03/06/16 288 lb 1.9 oz (130.7 kg)  02/13/16 280 lb 1.9 oz (127.1 kg)  02/07/16 286 lb 9.6 oz (130 kg)    General: Pleasant. Obese male who is alert and in no acute distress.   HEENT: Normal.  Neck:  Supple, no JVD, carotid bruits, or masses noted.  Cardiac: Regular rate and rhythm. No murmurs, rubs, or gallops. No edema.  Respiratory:  Lungs are clear to auscultation bilaterally with normal work of breathing.  GI: Soft and nontender.  MS: No deformity or atrophy. Gait and ROM intact.  Skin: Warm and dry. Color is normal. He does have a red raised rash on the right forearm Neuro:  Strength and sensation are intact and no gross focal deficits noted.  Psych: Alert,  appropriate and with normal affect.   LABORATORY DATA:  EKG:  EKG is not ordered today.  Lab Results  Component Value Date   WBC 9.5 02/13/2016   HGB 14.2 02/13/2016   HCT 41.9 02/13/2016   PLT 149 02/13/2016   GLUCOSE 103 (H) 02/13/2016   CHOL 167 10/29/2015   TRIG 303 (H) 10/29/2015   HDL 24 (L) 10/29/2015   LDLDIRECT 104.4 05/12/2008   LDLCALC 82 10/29/2015   ALT 23 02/05/2016   AST 18 02/05/2016   NA 139 02/13/2016   K 4.2 02/13/2016   CL 103 02/13/2016   CREATININE 1.33 (H) 02/13/2016   BUN 20 02/13/2016   CO2 28 02/13/2016   TSH 0.93 12/18/2015   PSA 0.67 05/12/2008   INR 1.09 02/06/2016   HGBA1C 6.2 (H) 10/29/2015    BNP (last 3 results) No results for input(s): BNP in the last 8760 hours.  ProBNP (last 3 results) No results for input(s): PROBNP in the last 8760 hours.   Other Studies Reviewed Today:  Coronary Stent Intervention 01/2016  Intravascular Pressure Wire/FFR Study  Left Heart Cath and Coronary Angiography  Conclusion     Mid LAD lesion, 60 %stenosed. FFR of this lesion was 0.82, not significant.  Mid Cx lesion, 80 %stenosed. A STENT SYNERGY DES [id number] drug eluting stent was successfully placed. The stent was postdilated to > 4.5 mm in diameter.  Post intervention, there is a 0% residual stenosis.  LV end diastolic pressure is mildly elevated.  There is no aortic valve stenosis.  Continue dual antiplatelet therapy for 12 months, along with aggressive secondary prevention.  He will be watched overnight. Likely discharge in AM.      Assessment/Plan: 1. CAD with recent PCI with DES to the LCX - on DAPT - doing well clinically.   2. Residual CAD with insignificant FFR in the LAD lesion - no active symptoms. Will manage medically and with CV risk factor modification  3. Ischemic CM - on good regimen - ARB increased at last visit ARB and changed Lasix to just prn. Echo next month. He is very reluctant to increase his medicines further since he is feeling "so good".   4. Chronic systolic HF - see #3  5. Obesity  6. Prior stroke - recovered nicely.   7. Gout - colchicine refilled today  8. Rash - Keflex 250 QID to use as needed.    Current medicines are reviewed with the patient today.  The patient does not have concerns regarding medicines other than what has been noted above.  The following changes have been made:  See above.  Labs/ tests ordered today include:   No orders of the defined types were placed in this encounter.    Disposition:   FU with Dr. Mayford Knife in 3 months.   Patient is agreeable to this plan and will call if any problems develop in the interim.   Signed: Rosalio Macadamia, RN, ANP-C 03/06/2016 9:24 AM  [facility] [address] [place], [place]  [address] Phone: [phone] Fax: [phone]

## 2016-03-06 ENCOUNTER — Ambulatory Visit (INDEPENDENT_AMBULATORY_CARE_PROVIDER_SITE_OTHER): Payer: Self-pay | Admitting: Nurse Practitioner

## 2016-03-06 ENCOUNTER — Encounter: Payer: Self-pay | Admitting: Nurse Practitioner

## 2016-03-06 VITALS — BP 140/82 | HR 77 | Ht 72.0 in | Wt 288.1 lb

## 2016-03-06 DIAGNOSIS — I420 Dilated cardiomyopathy: Secondary | ICD-10-CM

## 2016-03-06 DIAGNOSIS — Z955 Presence of coronary angioplasty implant and graft: Secondary | ICD-10-CM

## 2016-03-06 DIAGNOSIS — I10 Essential (primary) hypertension: Secondary | ICD-10-CM

## 2016-03-06 DIAGNOSIS — E7800 Pure hypercholesterolemia, unspecified: Secondary | ICD-10-CM

## 2016-03-06 MED ORDER — CEPHALEXIN 250 MG PO CAPS: 250.0000 mg | ORAL_CAPSULE | Freq: Four times a day (QID) | ORAL | 0 refills | Status: DC

## 2016-03-06 MED ORDER — COLCHICINE 0.6 MG PO TABS: 0.6000 mg | ORAL_TABLET | Freq: Every day | ORAL | 6 refills | Status: DC | PRN

## 2016-03-06 NOTE — Patient Instructions (Addendum)
We will be checking the following labs today - NONE   Medication Instructions:    Continue with your current medicines.   I sent in your refill for your Colchicine and Keflex today.     Testing/Procedures To Be Arranged:  Echocardiogram as planned.  Follow-Up:   See [name] or me a few weeks after your echo to discuss.     Other Special Instructions:   Keep restricting your salt    If you need a refill on your cardiac medications before your next appointment, please call your pharmacy.   Call the [facility] office at [phone] if you have any questions, problems or concerns.

## 2016-03-13 ENCOUNTER — Telehealth: Payer: Self-pay | Admitting: Cardiology

## 2016-03-13 NOTE — Telephone Encounter (Signed)
Rescheduled fasting lab appointment to same day as ECHO appointment in November. Patient understands to come fasting. He was grateful for assistance.

## 2016-03-13 NOTE — Telephone Encounter (Signed)
New Message:    Pt wants to know if he needs to fast for his blood work on Monday?

## 2016-03-18 ENCOUNTER — Other Ambulatory Visit: Payer: Self-pay

## 2016-03-18 ENCOUNTER — Other Ambulatory Visit: Payer: Self-pay | Admitting: Family Medicine

## 2016-03-19 NOTE — Telephone Encounter (Signed)
Electronic refill request. Notation is made that this medication was discontinued and it is not on the current meds list.  Please advise.

## 2016-03-19 NOTE — Telephone Encounter (Signed)
Denied.  Thanks.  

## 2016-04-10 ENCOUNTER — Other Ambulatory Visit: Payer: Self-pay | Admitting: Nurse Practitioner

## 2016-04-10 ENCOUNTER — Other Ambulatory Visit: Payer: Self-pay

## 2016-04-10 ENCOUNTER — Ambulatory Visit (HOSPITAL_COMMUNITY): Payer: Self-pay | Attending: Cardiology

## 2016-04-10 ENCOUNTER — Other Ambulatory Visit: Payer: Self-pay | Admitting: *Deleted

## 2016-04-10 DIAGNOSIS — I10 Essential (primary) hypertension: Secondary | ICD-10-CM

## 2016-04-10 DIAGNOSIS — I420 Dilated cardiomyopathy: Secondary | ICD-10-CM | POA: Insufficient documentation

## 2016-04-10 DIAGNOSIS — I2510 Atherosclerotic heart disease of native coronary artery without angina pectoris: Secondary | ICD-10-CM | POA: Insufficient documentation

## 2016-04-10 DIAGNOSIS — Z955 Presence of coronary angioplasty implant and graft: Secondary | ICD-10-CM | POA: Insufficient documentation

## 2016-04-10 DIAGNOSIS — E669 Obesity, unspecified: Secondary | ICD-10-CM | POA: Insufficient documentation

## 2016-04-10 DIAGNOSIS — E785 Hyperlipidemia, unspecified: Secondary | ICD-10-CM

## 2016-04-10 DIAGNOSIS — I255 Ischemic cardiomyopathy: Secondary | ICD-10-CM | POA: Insufficient documentation

## 2016-04-10 DIAGNOSIS — Z87891 Personal history of nicotine dependence: Secondary | ICD-10-CM | POA: Insufficient documentation

## 2016-04-10 LAB — LIPID PANEL
CHOL/HDL RATIO: 5.8 ratio — AB (ref <5.0)
Cholesterol: 116 mg/dL (ref <200)
HDL: 20 mg/dL — ABNORMAL LOW (ref >40)
LDL CALC: 21 mg/dL (ref <100)
Triglycerides: 374 mg/dL — ABNORMAL HIGH (ref <150)
VLDL: 75 mg/dL — AB (ref <30)

## 2016-04-10 LAB — ALT: ALT: 33 U/L (ref 9–46)

## 2016-04-16 ENCOUNTER — Telehealth: Payer: Self-pay

## 2016-04-16 DIAGNOSIS — E785 Hyperlipidemia, unspecified: Secondary | ICD-10-CM

## 2016-04-16 MED ORDER — FENOFIBRATE 145 MG PO TABS: 145.0000 mg | ORAL_TABLET | Freq: Every day | ORAL | 11 refills | Status: DC

## 2016-04-16 NOTE — Telephone Encounter (Signed)
Informed patient of results and verbal understanding expressed.  Instructed patient to START TRICOR 145 mg daily. FLP and ALT scheduled 06/17/16. Patient agrees with treatment plan.

## 2016-04-16 NOTE — Telephone Encounter (Signed)
-----   Message from Quintella Reichertraci R Turner, MD sent at 04/12/2016  1:46 PM EST ----- Add Tricor 145mg  daily and repeat FLp and ALT in 8 weeks

## 2016-04-24 ENCOUNTER — Ambulatory Visit: Payer: Self-pay | Admitting: Cardiology

## 2016-04-29 ENCOUNTER — Telehealth: Payer: Self-pay | Admitting: Cardiology

## 2016-04-29 NOTE — Telephone Encounter (Signed)
Patient is getting his teeth cleaned tomorrow and he was instructed to ask [name] if he needs to take anything prior to appointment.

## 2016-04-29 NOTE — Telephone Encounter (Signed)
He does not need SBE prophylaxis.   

## 2016-04-29 NOTE — Telephone Encounter (Signed)
Mr.Cardiff is asking that you give him a call in reference to his health . Please call .[name]. Thanks

## 2016-04-30 NOTE — Telephone Encounter (Signed)
Left message for patient that no special instructions are needed for his appointment today. Instructed him to call if he has any further questions or concerns.

## 2016-05-03 ENCOUNTER — Encounter: Payer: Self-pay | Admitting: *Deleted

## 2016-05-03 ENCOUNTER — Ambulatory Visit (INDEPENDENT_AMBULATORY_CARE_PROVIDER_SITE_OTHER): Payer: Self-pay | Admitting: Cardiology

## 2016-05-03 VITALS — BP 122/82 | HR 78 | Ht 72.0 in | Wt 291.0 lb

## 2016-05-03 DIAGNOSIS — I420 Dilated cardiomyopathy: Secondary | ICD-10-CM

## 2016-05-03 DIAGNOSIS — I2510 Atherosclerotic heart disease of native coronary artery without angina pectoris: Secondary | ICD-10-CM

## 2016-05-03 DIAGNOSIS — E785 Hyperlipidemia, unspecified: Secondary | ICD-10-CM

## 2016-05-03 DIAGNOSIS — I2583 Coronary atherosclerosis due to lipid rich plaque: Secondary | ICD-10-CM

## 2016-05-03 DIAGNOSIS — I10 Essential (primary) hypertension: Secondary | ICD-10-CM

## 2016-05-03 MED ORDER — SACUBITRIL-VALSARTAN 49-51 MG PO TABS: 1.0000 | ORAL_TABLET | Freq: Two times a day (BID) | ORAL | 11 refills | Status: DC

## 2016-05-03 MED ORDER — OMEGA-3-ACID ETHYL ESTERS 1 G PO CAPS: 2.0000 g | ORAL_CAPSULE | Freq: Two times a day (BID) | ORAL | 11 refills | Status: DC

## 2016-05-03 NOTE — Patient Instructions (Signed)
Medication Instructions:  1) STOP LOSARTAN 2) START ENTRESTO 49/51 TWICE DAILY 3) START LOVAZA 2g TWICE DAILY  Labwork: Your physician recommends that you return for lab work in 1 week (when you come for [facility] appointment)  Testing/Procedures: [name] recommends you have a CARDIAC MRI in 1 MONTH.  Follow-Up: Your physician recommends that you schedule a follow-up appointment in the [facility] in ONE WEEK.  Your physician wants you to follow-up in: 6 months with [name]. You will receive a reminder letter in the mail two months in advance. If you don't receive a letter, please call our office to schedule the follow-up appointment.   Any Other Special Instructions Will Be Listed Below (If Applicable).     If you need a refill on your cardiac medications before your next appointment, please call your pharmacy.

## 2016-05-03 NOTE — Progress Notes (Signed)
Cardiology Office Note    Date:  05/03/2016   ID:  Colton BorsVictor Doorn Jr., DOB Feb 23, 1956, MRN [id number]  PCP:  Crawford GivensGraham Duncan, MD  Cardiologist:  Armanda Magicraci Turner, MD   Chief Complaint  Patient presents with  . Coronary Artery Disease  . Hypertension  . Hyperlipidemia    History of Present Illness:  Colton BorsVictor Isaac Jr. is a 60 y.o. male with a history ofa stroke in June 2017 treated with IV TPA.2D echo at that time showed LV dysfunction with EF 35-40% with elevated LDL at 82. He was started on ASA and statin but stopped the statin due to fear of side effects. With his dilated cardiomyopathy anuclear stress test was done 01/18/16. He had moderate decrease in LVEF-30-44%. Findings were consistent with prior MI with a defect in the inferior, inferolateral wall consistent with scar. HisLVEF was 30% by Myoview. A coronary  CTA was done 01/31/16-which showed mild to moderate diffuse mixed plaque in the proximal LAD and moderate and possibly severe noncalcified plaque in the mid circumflex with high risk features. A FFRwas ordered on the patient circumflex and came back less than 0.7 consistent with significant stenosis. The pt was admitted for diagnostic cath 02/06/16. This revealed an 80% mid CFX lesion, treated with a DES, and a 60% LAD lesion, not significant by FFR. The pt tolerated this well and is stable for discharge 02/07/16.  The pt declined high dose statin Rx. Repeat 2D echo 04/10/2016 showed EF 35-40% with diffuse HK.  He is here today for followup. He is doing well today.  He says he feels the best he has in years  He denies any chest pain.  He has chronic SOB which is stable.  He has not had any palpitations, dizziness, LE edema or syncope.     Past Medical History:  Diagnosis Date  . Coronary artery disease    cath 01/2016 with 60% LAD with FFR 0.82 and 80% mid LCX s/p PCI  . Dilated cardiomyopathy (HCC)    EF 35-40% by echo  . Discoid lupus   . Fatty liver   . Former smoker     . [facility]  . History of gout   . Hyperlipidemia LDL goal <70   . Hypertension   . Rosacea   . Stroke [facility](HCC)    s/p tpa 2017 w/o residual defecit    Past Surgical History:  Procedure Laterality Date  . CARDIAC CATHETERIZATION N/A 02/06/2016   Procedure: Left Heart Cath and Coronary Angiography;  Surgeon: Corky CraftsJayadeep S Varanasi, MD;  [facility];  Service: Cardiovascular;  Laterality: N/A;  . CARDIAC CATHETERIZATION N/A 02/06/2016   Procedure: Coronary Stent Intervention;  Surgeon: Corky CraftsJayadeep S Varanasi, MD;  [facility];  Service: Cardiovascular;  Laterality: N/A;  . CARDIAC CATHETERIZATION N/A 02/06/2016   Procedure: Intravascular Pressure Wire/FFR Study;  Surgeon: Corky CraftsJayadeep S Varanasi, MD;  [facility];  Service: Cardiovascular;  Laterality: N/A;  . Cardiolite  08/16/2008   [facility]    Current Medications: Outpatient Medications Prior to Visit  Medication Sig Dispense Refill  . allopurinol (ZYLOPRIM) 100 MG tablet one tablet daily  6  . aspirin 81 MG chewable tablet Chew 1 tablet (81 mg total) by mouth daily.    [name]. atorvastatin (LIPITOR) 20 MG tablet Take 1 tablet (20 mg total) by mouth daily. 30 tablet 11  . carvedilol (COREG) 12.5 MG tablet Take 1 tablet (12.5 mg total) by  mouth 2 (two) times daily. 60 tablet 11  . cephALEXin (KEFLEX) 250 MG capsule Take 1 capsule (250 mg total) by mouth 4 (four) times daily. 30 capsule 0  . clopidogrel (PLAVIX) 75 MG tablet Take 1 tablet (75 mg total) by mouth daily with breakfast. 90 tablet 3  . colchicine 0.6 MG tablet Take 1 tablet (0.6 mg total) by mouth daily as needed (gout). 30 tablet 6  . losartan (COZAAR) 100 MG tablet Take 1 tablet (100 mg total) by mouth daily. 90 tablet 3  . nitroGLYCERIN (NITROSTAT) 0.4 MG SL tablet Place 1 tablet (0.4 mg total) under the tongue every 5 (five) minutes as needed for chest pain. 25 tablet 2  . spironolactone (ALDACTONE) 25 MG  tablet Take 0.5 tablets (12.5 mg total) by mouth daily. 30 tablet 11  . Tetrahydrozoline HCl (VISINE OP) Place 1 drop into both eyes as needed (for dry eyes).    . fenofibrate (TRICOR) 145 MG tablet Take 1 tablet (145 mg total) by mouth daily. (Patient not taking: Reported on 05/03/2016) 30 tablet 11  . furosemide (LASIX) 20 MG tablet Take 1 tablet (20 mg total) by mouth daily as needed. (Patient not taking: Reported on 05/03/2016) 90 tablet 3   No facility-administered medications prior to visit.      Allergies:   Corticosteroids and Latex   Social History   Social History  . Marital status: Widowed    Spouse name: N/A  . Number of children: 0  . Years of education: N/A   Occupational History  . Property Patents/Inventions/Patents    Social History Main Topics  . Smoking status: Former Smoker    Packs/day: 2.00    Years: 45.00    Types: Cigarettes    Quit date: 10/28/2015  . Smokeless tobacco: Never Used  . Alcohol use 1.2 oz/week    1 Glasses of wine, 1 Shots of liquor per week  . Drug use: No  . Sexual activity: Not Currently   Other Topics Concern  . None   Social History Narrative   [employer] '78-'79   Prev investment banking, now trades crude Mudlogger   Widowed, wife died of mesothelioma   No kids     Family History:  The patient's family history includes CAD in his father and other; Heart disease in his father and other; Lung cancer in his other.   ROS:   Please see the history of present illness.    ROS All other systems reviewed and are negative.  No flowsheet data found.     PHYSICAL EXAM:   VS:  BP 122/82   Pulse 78   Ht 6' (1.829 m)   Wt 291 lb (132 kg)   SpO2 97%   BMI 39.47 kg/m    GEN: Well nourished, well developed, in no acute distress  HEENT: normal  Neck: no JVD, carotid bruits, or masses Cardiac: RRR; no murmurs, rubs, or gallops,no edema.  Intact distal pulses bilaterally.  Respiratory:  clear to auscultation bilaterally,  normal work of breathing GI: soft, nontender, nondistended, + BS MS: no deformity or atrophy  Skin: warm and dry, no rash Neuro:  Alert and Oriented x 3, Strength and sensation are intact Psych: euthymic mood, full affect  Wt Readings from Last 3 Encounters:  05/03/16 291 lb (132 kg)  03/06/16 288 lb 1.9 oz (130.7 kg)  02/13/16 280 lb 1.9 oz (127.1 kg)      Studies/Labs Reviewed:   EKG:  EKG is not ordered  today.    Recent Labs: 12/18/2015: TSH 0.93 02/13/2016: BUN 20; Creat 1.33; Hemoglobin 14.2; Platelets 149; Potassium 4.2; Sodium 139 04/10/2016: ALT 33   Lipid Panel    Component Value Date/Time   CHOL 116 04/10/2016 0917   TRIG 374 (H) 04/10/2016 0917   HDL 20 (L) 04/10/2016 0917   CHOLHDL 5.8 (H) 04/10/2016 0917   VLDL 75 (H) 04/10/2016 0917   LDLCALC 21 04/10/2016 0917   LDLDIRECT 104.4 05/12/2008 1304    Additional studies/ records that were reviewed today include:  none    ASSESSMENT:    1. Coronary artery disease due to lipid rich plaque   2. DCM (dilated cardiomyopathy) (HCC)   3. Essential hypertension   4. Hyperlipidemia with target LDL less than 70      PLAN:  In order of problems listed above:  1. ASCAD with cath showing 80% mid CFX lesion, treated with a DES, and a 60% LAD lesion, not significant by FFR.  He has not had any further angina.  Continue ASA/BB/statin/Plavix. 2. DCM - EF on recent echo still reduced at 35-40%.  I am going to get a cardiac MRI to get a better assessment of EF to determine if he needs an AICD since he is now 2 months post revascularization on max medical therapy. Continue aldactone/ARB/BB.  [facility]  and have him followup in CHF clinic in pharmacy for uptitration  3. HTN - BP controlled on ARB and BB. 4. Hyperlipidemia - LDL goal is < 70.  His LDL was 21 in November.  Continue statin. He stopped fenofibrate due to concommitant use of colchicine.  I will add Lovaza 2gm BID.  Repeat FLP and ALT in 8  weeks.     Medication Adjustments/Labs and Tests Ordered: Current medicines are reviewed at length with the patient today.  Concerns regarding medicines are outlined above.  Medication changes, Labs and Tests ordered today are listed in the Patient Instructions below.  There are no Patient Instructions on file for this visit.   Signed, Armanda Magicraci Turner, MD  05/03/2016 9:35 AM    [facility] [address], [place], [place]  [address] Phone: [phone]) [phone]; Fax: [phone]

## 2016-05-06 ENCOUNTER — Encounter: Payer: Self-pay | Admitting: Cardiology

## 2016-05-06 ENCOUNTER — Telehealth: Payer: Self-pay | Admitting: Cardiology

## 2016-05-06 NOTE — Telephone Encounter (Signed)
Mailed cardiac MRI letter to the patient today.

## 2016-05-06 NOTE — Telephone Encounter (Signed)
New Message:     He said he wanted to talk to the nurse. I asked him what it was concerning,he said he need to speak to the nurse.

## 2016-05-06 NOTE — Telephone Encounter (Signed)
Patient states he spoke with Sherryll BurgerEntresto and will get his medication for free for a year if [name] signs paperwork. He will drop off the paperwork at the front desk tomorrow. He reports he will complete "his part" and after [name] completes hers, nursing will fax to number on form. He was grateful for call.

## 2016-05-10 ENCOUNTER — Other Ambulatory Visit: Payer: Self-pay | Admitting: *Deleted

## 2016-05-10 ENCOUNTER — Ambulatory Visit (INDEPENDENT_AMBULATORY_CARE_PROVIDER_SITE_OTHER): Payer: Self-pay | Admitting: Pharmacist Clinician (PhC)/ Clinical Pharmacy Specialist

## 2016-05-10 DIAGNOSIS — I420 Dilated cardiomyopathy: Secondary | ICD-10-CM

## 2016-05-10 DIAGNOSIS — I10 Essential (primary) hypertension: Secondary | ICD-10-CM

## 2016-05-10 LAB — BASIC METABOLIC PANEL
BUN: 16 mg/dL (ref 7–25)
CHLORIDE: 105 mmol/L (ref 98–110)
CO2: 24 mmol/L (ref 20–31)
Calcium: 9 mg/dL (ref 8.6–10.3)
Creat: 1.38 mg/dL — ABNORMAL HIGH (ref 0.70–1.25)
Glucose, Bld: 138 mg/dL — ABNORMAL HIGH (ref 65–99)
POTASSIUM: 4.7 mmol/L (ref 3.5–5.3)
SODIUM: 138 mmol/L (ref 135–146)

## 2016-05-10 MED ORDER — SACUBITRIL-VALSARTAN 97-103 MG PO TABS: 1.0000 | ORAL_TABLET | Freq: Two times a day (BID) | ORAL | 5 refills | Status: DC

## 2016-05-10 NOTE — Assessment & Plan Note (Addendum)
Patient tolerating the Entresto 49/51 mg dose without concern. Blood pressure stable at 126/82 today in office.  Will have him finish out his remaining week of 49/51 samples, then increase the dose to 97/103 mg twice daily.  He was given a card for a free 30 day supply.  Cost may be an issue in the future as patient is self pay.  Will see him back in 1 month for follow up to be sure he is doing well on the higher dose.

## 2016-05-10 NOTE — Patient Instructions (Signed)
Your blood pressure today is 128/82   Take your BP meds as follows:  Increase Entresto to 97/103 after your current samples are gone.  Exercise as you're able, try to walk approximately 30 minutes per day.  Keep salt intake to a minimum, especially watch canned and prepared boxed foods.  Eat more fresh fruits and vegetables and fewer canned items.  Avoid eating in fast food restaurants.    HOW TO TAKE YOUR BLOOD PRESSURE: . Rest 5 minutes before taking your blood pressure. .  Don't smoke or drink caffeinated beverages for at least 30 minutes before. . Take your blood pressure before (not after) you eat. . Sit comfortably with your back supported and both feet on the floor (don't cross your legs). . Elevate your arm to heart level on a table or a desk. . Use the proper sized cuff. It should fit smoothly and snugly around your bare upper arm. There should be enough room to slip a fingertip under the cuff. The bottom edge of the cuff should be 1 inch above the crease of the elbow. . Ideally, take 3 measurements at one sitting and record the average.

## 2016-05-10 NOTE — Progress Notes (Signed)
05/10/2016 Colton BorsVictor Willenbring Jr. 04-01-1956 [id number]   HPI:  Colton BorsVictor Heidemann Jr. is a 60 y.o. male patient of Dr Mayford Knifeurner, with a PMH below who presents today for titration of Entresto.  He was first diagnosed with a CVA in June of 2017, but a nuclear stress test showed moderate decrease in LVEF (30-44%) and consistent with a prior MI.  A cardiac cath done in September showed an 80% mid CFX lesion that was treated with a DES.    When he saw [name] last week, his losartan was discontinued and he was started on Entresto 49/51 mg twice daily.  He returns today for follow up and dose titration.  He has no complaints about the medication, only noted a couple of moments of lightheadedness when he first started the medication.  He does not have a home blood pressure cuff.  He had a BMET drawn this morning.    Blood Pressure Goal:  130/80   Current Medications:  Entresto 49/51  Spironolactone 25 mg qd  Family Hx:  No strong family history - father had CAD, CHF, but no other known family with cardiac disease.  Mother taught aerobics until she was 780  Social Hx:  Quit smoking at time of stroke, states no problems with cravings since then.  No alcohol; drinks 1 cup of coffee per day  Diet:  Has improved over past few months.  His goal is to lose 50 pounds in 2018.  Diet consists of eating out regularly, but eating more salads, fish and chicken, rare red meat/burgers; not many "white foods"    Exercise:  Has just bought stationary bike and will start an exercise program of on-line bike classes thru [employer].    Home BP readings:  No home BP cuff  Intolerances:   No cardiac medication intolerances  Wt Readings from Last 3 Encounters:  05/10/16 286 lb (129.7 kg)  05/03/16 291 lb (132 kg)  03/06/16 288 lb 1.9 oz (130.7 kg)   BP Readings from Last 3 Encounters:  05/10/16 126/82  05/03/16 122/82  03/06/16 140/82   Pulse Readings from Last 3 Encounters:  05/03/16 78  03/06/16 77   02/13/16 84    Current Outpatient Prescriptions  Medication Sig Dispense Refill  . allopurinol (ZYLOPRIM) 100 MG tablet one tablet daily  6  . aspirin 81 MG chewable tablet Chew 1 tablet (81 mg total) by mouth daily.    [name]. atorvastatin (LIPITOR) 20 MG tablet Take 1 tablet (20 mg total) by mouth daily. 30 tablet 11  . carvedilol (COREG) 12.5 MG tablet Take 1 tablet (12.5 mg total) by mouth 2 (two) times daily. 60 tablet 11  . cephALEXin (KEFLEX) 250 MG capsule Take 1 capsule (250 mg total) by mouth 4 (four) times daily. 30 capsule 0  . clopidogrel (PLAVIX) 75 MG tablet Take 1 tablet (75 mg total) by mouth daily with breakfast. 90 tablet 3  . colchicine 0.6 MG tablet Take 1 tablet (0.6 mg total) by mouth daily as needed (gout). 30 tablet 6  . nitroGLYCERIN (NITROSTAT) 0.4 MG SL tablet Place 1 tablet (0.4 mg total) under the tongue every 5 (five) minutes as needed for chest pain. 25 tablet 2  . omega-3 acid ethyl esters (LOVAZA) 1 g capsule Take 2 capsules (2 g total) by mouth 2 (two) times daily. 120 capsule 11  . sacubitril-valsartan (ENTRESTO) 97-103 MG Take 1 tablet by mouth 2 (two) times daily. 60 tablet 5  . spironolactone (ALDACTONE)  25 MG tablet Take 0.5 tablets (12.5 mg total) by mouth daily. (Patient taking differently: Take 25 mg by mouth daily. ) 30 tablet 11  . Tetrahydrozoline HCl (VISINE OP) Place 1 drop into both eyes as needed (for dry eyes).     No current facility-administered medications for this visit.     Allergies  Allergen Reactions  . Corticosteroids Other (See Comments)    Muscle spasms  . Latex Rash    Past Medical History:  Diagnosis Date  . Coronary artery disease    cath 01/2016 with 60% LAD with FFR 0.82 and 80% mid LCX s/p PCI  . Dilated cardiomyopathy (HCC)    EF 35-40% by echo  . Discoid lupus   . Fatty liver   . Former smoker   . [facility]  . History of gout   . Hyperlipidemia LDL goal <70   . Hypertension   . Rosacea   .  Stroke [facility](HCC)    s/p tpa 2017 w/o residual defecit    Blood pressure 126/82, height 6' (1.829 m), weight 286 lb (129.7 kg).  DCM (dilated cardiomyopathy) (HCC) Patient tolerating the Entresto 49/51 mg dose without concern. Blood pressure stable at 126/82 today in office.  Will have him finish out his remaining week of 49/51 samples, then increase the dose to 97/103 mg twice daily.  He was given a card for a free 30 day supply.  Cost may be an issue in the future as patient is self pay.  Will see him back in 1 month for follow up to be sure he is doing well on the higher dose.     Phillips HayKristin Raven Harmes PharmD CPP [facility]

## 2016-05-13 ENCOUNTER — Encounter: Payer: Self-pay | Admitting: Cardiology

## 2016-05-13 ENCOUNTER — Other Ambulatory Visit (HOSPITAL_COMMUNITY): Payer: Self-pay

## 2016-05-16 ENCOUNTER — Telehealth: Payer: Self-pay

## 2016-05-16 NOTE — Telephone Encounter (Signed)
[facility] was received and deemed complete.

## 2016-05-28 NOTE — Telephone Encounter (Signed)
Linda M Reiland, LPN   29/52/8412/21/17 1:Doralee Albino329:33 AM  Note    [facility] was received and deemed complete.

## 2016-06-04 ENCOUNTER — Telehealth: Payer: Self-pay | Admitting: Cardiology

## 2016-06-04 NOTE — Telephone Encounter (Signed)
Patient called for update on Entresto paperwork.  Informed him Bonita QuinLinda, the PA nurse, will call him tomorrow to follow-up. He also requests to come by the office Friday to pick up Entresto samples. He understands to discuss this with Bonita QuinLinda tomorrow as well.

## 2016-06-04 NOTE — Telephone Encounter (Signed)
Pt calling to check on some "documents" that he brought to the office and had a question regarding an upcoming appt-pls call

## 2016-06-05 NOTE — Telephone Encounter (Signed)
Spoke with patient today to let him know that [facility] him for assistance. He states he hasn't heard from them yet. I gave him their number to call. He only has a few days of Entresto left. I have called the [employer] Rep to see if they can provide samples.

## 2016-06-07 ENCOUNTER — Ambulatory Visit (HOSPITAL_COMMUNITY)
Admission: RE | Admit: 2016-06-07 | Discharge: 2016-06-07 | Disposition: A | Discharge status: Home / Self Care | Payer: Self-pay | Source: Ambulatory Visit | Attending: Cardiology | Admitting: Cardiology

## 2016-06-07 ENCOUNTER — Ambulatory Visit (INDEPENDENT_AMBULATORY_CARE_PROVIDER_SITE_OTHER): Payer: Self-pay | Admitting: Family Medicine

## 2016-06-07 ENCOUNTER — Encounter: Payer: Self-pay | Admitting: Family Medicine

## 2016-06-07 VITALS — BP 126/64 | HR 86 | Temp 99.0°F | Wt 292.5 lb

## 2016-06-07 DIAGNOSIS — J069 Acute upper respiratory infection, unspecified: Secondary | ICD-10-CM

## 2016-06-07 DIAGNOSIS — I081 Rheumatic disorders of both mitral and tricuspid valves: Secondary | ICD-10-CM | POA: Insufficient documentation

## 2016-06-07 DIAGNOSIS — I420 Dilated cardiomyopathy: Secondary | ICD-10-CM | POA: Insufficient documentation

## 2016-06-07 DIAGNOSIS — I255 Ischemic cardiomyopathy: Secondary | ICD-10-CM | POA: Insufficient documentation

## 2016-06-07 LAB — CREATININE, SERUM: Creatinine, Ser: 1.16 mg/dL (ref 0.61–1.24)

## 2016-06-07 MED ORDER — AZITHROMYCIN 250 MG PO TABS: ORAL_TABLET | ORAL | 0 refills | Status: DC

## 2016-06-07 MED ORDER — GADOBENATE DIMEGLUMINE 529 MG/ML IV SOLN
38.0000 mL | Freq: Once | INTRAVENOUS | Status: AC | PRN
  Administered 2016-06-07: 38 mL via INTRAVENOUS

## 2016-06-07 NOTE — Telephone Encounter (Signed)
No samples available in 97-103. Patient was given 2 weeks worth of 49-51, to take 2 bid. [facility] says that he was denied for assistance. They are mailing him an application for [employer] Patient  Assistance.

## 2016-06-07 NOTE — Progress Notes (Signed)
Subjective:    Patient ID: Colton Soules., male    DOB: Jan 13, 1956, 60 y.o.   MRN: [id number]  HPI This is a 61 yo male who presents today with cough and chest congestion for 2 days. Cough productive of white phlegm, some wheezing and SOB. Nasal drainage that is white, some congestion. No ear pain or sore throat. Slight fever. He reports history of frequent mycoplasm. Was around a sick person last week. He is requesting a shot of Rocephin and a course of Biaxin. Has never required an inhaler in the past. No increase in cough with lying down. No chest pain. He is "terrified" of having this worsen and affecting his heart.    Past Medical History:  Diagnosis Date  . Coronary artery disease    cath 01/2016 with 60% LAD with FFR 0.82 and 80% mid LCX s/p PCI  . Dilated cardiomyopathy (HCC)    EF 35-40% by echo  . Discoid lupus   . Fatty liver   . Former smoker   . Gout 07/2001   [facility]  . History of gout   . Hyperlipidemia LDL goal <70   . Hypertension   . Rosacea   . Stroke [facility])    s/p tpa 2017 w/o residual defecit   Past Surgical History:  Procedure Laterality Date  . CARDIAC CATHETERIZATION N/A 02/06/2016   Procedure: Left Heart Cath and Coronary Angiography;  Surgeon: Corky Crafts, MD;  Location[facility] INVASIVE CV LAB;  Service: Cardiovascular;  Laterality: N/A;  . CARDIAC CATHETERIZATION N/A 02/06/2016   Procedure: Coronary Stent Intervention;  Surgeon: Corky Crafts, MD;  Location: [facility] INVASIVE CV LAB;  Service: Cardiovascular;  Laterality: N/A;  . CARDIAC CATHETERIZATION N/A 02/06/2016   Procedure: Intravascular Pressure Wire/FFR Study;  Surgeon: Corky Crafts, MD;  Location: [facility] INVASIVE CV LAB;  Service: Cardiovascular;  Laterality: N/A;  . Cardiolite  08/16/2008   [facility]   Family History  Problem Relation Age of Onset  . Heart disease Father   . CAD Father   . Heart disease Other   . CAD Other   . Lung cancer Other   . Colon  cancer Neg Hx   . Prostate cancer Neg Hx    Social History  Substance Use Topics  . Smoking status: Former Smoker    Packs/day: 2.00    Years: 45.00    Types: Cigarettes    Quit date: 10/28/2015  . Smokeless tobacco: Never Used  . Alcohol use 1.2 oz/week    1 Glasses of wine, 1 Shots of liquor per week      Review of Systems     Objective:   Physical Exam  Constitutional: He is oriented to person, place, and time. He appears well-developed and well-nourished. He appears ill. No distress.  HENT:  Head: Normocephalic and atraumatic.  Right Ear: Tympanic membrane, external ear and ear canal normal.  Left Ear: Tympanic membrane, external ear and ear canal normal.  Nose: Mucosal edema present. Right sinus exhibits no maxillary sinus tenderness and no frontal sinus tenderness. Left sinus exhibits no maxillary sinus tenderness and no frontal sinus tenderness.  Mouth/Throat: Posterior oropharyngeal erythema present. No oropharyngeal exudate.  Eyes: Conjunctivae are normal.  Neck: Normal range of motion. Neck supple.  Cardiovascular: Normal rate and normal heart sounds.   Pulmonary/Chest: Effort normal and breath sounds normal.  Lymphadenopathy:    He has no cervical adenopathy.  Neurological: He is alert and oriented to person, place, and time.  Skin: Skin is warm and dry. He is not diaphoretic.  Psychiatric: He has a normal mood and affect. His behavior is normal. Judgment and thought content normal.  Vitals reviewed.     BP 126/64   Pulse 86   Temp 99 F (37.2 C) (Oral)   Wt 292 lb 8 oz (132.7 kg)   SpO2 96%   BMI 39.67 kg/m  Wt Readings from Last 3 Encounters:  06/07/16 292 lb 8 oz (132.7 kg)  05/10/16 286 lb (129.7 kg)  05/03/16 291 lb (132 kg)   BP Readings from Last 3 Encounters:  06/07/16 126/64  05/10/16 126/82  05/03/16 122/82       Assessment & Plan:  1. Acute upper respiratory infection - discussed symptoms with ;patient and likely viral etiology, he  was very concerned for sudden deterioration.  Explained that Rocephin and Biaxin would not be prescribed today due to potential side effects and interaction with clopidogrel.  - azithromycin (ZITHROMAX) 250 MG tablet; Take 2 tabs PO x 1 dose, then 1 tab PO QD x 4 days  Dispense: 6 tablet; Refill: 0 -  Patient Instructions  For nasal congestion you can use, saline nasal spray. For cough you can try Delsym. Drink enough fluids to make your urine light yellow. For fever/chill/muscle aches you can take over the counter acetaminophen.  Please come back in if you are not better in 5-7 days or if you develop wheezing, shortness of breath or persistent vomiting.   Olean Reeeborah Dante Roudebush, FNP-BC  [facility]  06/07/2016 11:33 AM

## 2016-06-07 NOTE — Patient Instructions (Signed)
For nasal congestion you can use, saline nasal spray. For cough you can try Delsym. Drink enough fluids to make your urine light yellow. For fever/chill/muscle aches you can take over the counter acetaminophen.  Please come back in if you are not better in 5-7 days or if you develop wheezing, shortness of breath or persistent vomiting.

## 2016-06-07 NOTE — Progress Notes (Signed)
Pre visit review using our clinic review tool, if applicable. No additional management support is needed unless otherwise documented below in the visit note. 

## 2016-06-17 ENCOUNTER — Other Ambulatory Visit: Payer: Self-pay

## 2016-06-18 ENCOUNTER — Ambulatory Visit (INDEPENDENT_AMBULATORY_CARE_PROVIDER_SITE_OTHER): Payer: Self-pay | Admitting: Pharmacist

## 2016-06-18 ENCOUNTER — Other Ambulatory Visit: Payer: Self-pay | Admitting: *Deleted

## 2016-06-18 VITALS — BP 122/70 | HR 85 | Wt 283.5 lb

## 2016-06-18 DIAGNOSIS — I420 Dilated cardiomyopathy: Secondary | ICD-10-CM

## 2016-06-18 DIAGNOSIS — I10 Essential (primary) hypertension: Secondary | ICD-10-CM

## 2016-06-18 DIAGNOSIS — E785 Hyperlipidemia, unspecified: Secondary | ICD-10-CM

## 2016-06-18 NOTE — Addendum Note (Signed)
Addended by: Tonita PhoenixBOWDEN, Liset Mcmonigle K on: 06/18/2016 07:33 AM   Modules accepted: Orders

## 2016-06-18 NOTE — Progress Notes (Signed)
Patient ID: Colton [name].                 DOB: [birth year]                      MRN: [id number]     HPI: Colton Goetze. is a 61 y.o. male patient of Dr. Mayford Knife with PMH below who presents today for medication titration. He was diagnosed with a CVA in June of 2017, but a nuclear stress test showed moderate decrease in LVEF (30-44%) and consistent with a prior MI.  A cardiac cath done in September showed an 80% mid CFX lesion that was treated with a DES.   He was recently seen by Phillips Hay, PharmD and his Sherryll Burger was titrated to 97/103mg  BID; however since then he has called about patient assistance for the [facility] and he has been provided samples of 49-51mg  (97/103mg  is not sampled unfortunately). He states he was indeed given higher dose of Entresto 97/103mg  and has been taking this since his last visit in December. He has been working with Odette Horns for patient assistance and additional paperwork was provided to him today with MD portion completed. He is aware to fax/mail when his portion completed and to call if additional help required.   He reports today that he has been under a significant amount of stress recently. He has been having some "electrical impulses" that come and go randomly over the last 4 days (not currently experiencing) and he believes this is related to his stress. He also has been somewhat fatigued, but has recently had a respiratory infection. He reports that overall he has been feeling very well on Entresto and is pleased with his progress.    Current meds:  Entresto 97/103mg  BID Spironolactone 25mg  daily   Family Hx: No strong family history - father had CAD, CHF, but no other known family with cardiac disease.  Mother taught aerobics until she was 65  Social Hx:  Quit smoking at time of stroke, states no problems with cravings since then.  No alcohol; drinks 1 cup of coffee per day  Diet:  Has improved over past few months.  His goal is to lose  50 pounds in 2018.  Diet consists of eating out regularly, but eating more salads, fish and chicken, rare red meat/burgers; not many "white foods"    Exercise:  Has just bought stationary bike and will start an exercise program of on-line bike classes thru [employer].    Home BP readings:             No home BP cuff  Intolerances:              No cardiac medication intolerances  Wt Readings from Last 3 Encounters:  06/18/16 283 lb 8 oz (128.6 kg)  06/07/16 292 lb 8 oz (132.7 kg)  05/10/16 286 lb (129.7 kg)   BP Readings from Last 3 Encounters:  06/18/16 122/70  06/07/16 126/64  05/10/16 126/82   Pulse Readings from Last 3 Encounters:  06/18/16 85  06/07/16 86  05/03/16 78    Renal function: Estimated Creatinine Clearance: 92.7 mL/min (by C-G formula based on SCr of 1.16 mg/dL).  Past Medical History:  Diagnosis Date  . Coronary artery disease    cath 01/2016 with 60% LAD with FFR 0.82 and 80% mid LCX s/p PCI  . Dilated cardiomyopathy (HCC)    EF 35-40% by echo  . Discoid lupus   . Fatty  liver   . Former smoker   . [facility]  . History of gout   . Hyperlipidemia LDL goal <70   . Hypertension   . Rosacea   . Stroke [facility](HCC)    s/p tpa 2017 w/o residual defecit    Current Outpatient Prescriptions on File Prior to Visit  Medication Sig Dispense Refill  . aspirin 81 MG chewable tablet Chew 1 tablet (81 mg total) by mouth daily.    [name]. atorvastatin (LIPITOR) 20 MG tablet Take 1 tablet (20 mg total) by mouth daily. 30 tablet 11  . carvedilol (COREG) 12.5 MG tablet Take 1 tablet (12.5 mg total) by mouth 2 (two) times daily. 60 tablet 11  . clopidogrel (PLAVIX) 75 MG tablet Take 1 tablet (75 mg total) by mouth daily with breakfast. 90 tablet 3  . colchicine 0.6 MG tablet Take 1 tablet (0.6 mg total) by mouth daily as needed (gout). 30 tablet 6  . omega-3 acid ethyl esters (LOVAZA) 1 g capsule Take 2 capsules (2 g total) by mouth 2 (two) times daily. 120  capsule 11  . sacubitril-valsartan (ENTRESTO) 97-103 MG Take 1 tablet by mouth 2 (two) times daily. 60 tablet 5  . spironolactone (ALDACTONE) 25 MG tablet Take 0.5 tablets (12.5 mg total) by mouth daily. (Patient taking differently: Take 25 mg by mouth daily. ) 30 tablet 11  . Tetrahydrozoline HCl (VISINE OP) Place 1 drop into both eyes as needed (for dry eyes).    [name]. allopurinol (ZYLOPRIM) 100 MG tablet one tablet daily  6  . nitroGLYCERIN (NITROSTAT) 0.4 MG SL tablet Place 1 tablet (0.4 mg total) under the tongue every 5 (five) minutes as needed for chest pain. (Patient not taking: Reported on 06/18/2016) 25 tablet 2   No current facility-administered medications on file prior to visit.     Allergies  Allergen Reactions  . Corticosteroids Other (See Comments)    Muscle spasms  . Latex Rash    Blood pressure 122/70, pulse 85, weight 283 lb 8 oz (128.6 kg).   Assessment/Plan: Hypertension: BMET today - labs collected prior to visit today. BP at goal today and doing well on Entresto 97/103mg  BID. Patient assistance paperwork provided today. Advised to call if lethargy and other symptoms do not improve (since he believes stress related). Continue current dose and follow up with [name] as indicated and hypertension clinic as needed.    Thank you, Freddie ApleyKelley M. Cleatis PolkaAuten, PharmD  [facility]  06/18/2016 2:47 PM

## 2016-06-18 NOTE — Patient Instructions (Addendum)
Check your blood pressure at home daily (if able) and keep record of the readings.  Take your BP meds as follows: Continue Entresto 97/103 mg BID  [phone]  Bring all of your meds, your BP cuff and your record of home blood pressures to your next appointment.  Exercise as you're able, try to walk approximately 30 minutes per day.  Keep salt intake to a minimum, especially watch canned and prepared boxed foods.  Eat more fresh fruits and vegetables and fewer canned items.  Avoid eating in fast food restaurants.    HOW TO TAKE YOUR BLOOD PRESSURE: . Rest 5 minutes before taking your blood pressure. .  Don't smoke or drink caffeinated beverages for at least 30 minutes before. . Take your blood pressure before (not after) you eat. . Sit comfortably with your back supported and both feet on the floor (don't cross your legs). . Elevate your arm to heart level on a table or a desk. . Use the proper sized cuff. It should fit smoothly and snugly around your bare upper arm. There should be enough room to slip a fingertip under the cuff. The bottom edge of the cuff should be 1 inch above the crease of the elbow. . Ideally, take 3 measurements at one sitting and record the average.

## 2016-06-19 LAB — LIPID PANEL
CHOL/HDL RATIO: 4.5 ratio (ref 0.0–5.0)
Cholesterol, Total: 117 mg/dL (ref 100–199)
HDL: 26 mg/dL — AB (ref >39)
LDL Calculated: 53 mg/dL (ref 0–99)
TRIGLYCERIDES: 190 mg/dL — AB (ref 0–149)
VLDL CHOLESTEROL CAL: 38 mg/dL (ref 5–40)

## 2016-06-19 LAB — ALT: ALT: 47 IU/L — AB (ref 0–44)

## 2016-06-27 ENCOUNTER — Telehealth: Payer: Self-pay | Admitting: Cardiology

## 2016-06-27 NOTE — Telephone Encounter (Signed)
Maria from [employer] is calling because Byrd Hesselbachthey have not received the [employer] application from us. She can be reached at [phone] and the fax number is [phone]. Thanks.

## 2016-06-28 NOTE — Telephone Encounter (Signed)
I am not sure, possibly in her drawers or on her desk in the file folders.

## 2016-07-03 NOTE — Telephone Encounter (Signed)
Spoke with patient today. He has sent all his paperwork into [employer] Assistance Program. Picked up a 30 day supply of Entresto yesterday and paid $600 out of pocket. Assured him that he should hear something from [employer] soon. He thanked me for the call.

## 2016-07-22 ENCOUNTER — Ambulatory Visit (INDEPENDENT_AMBULATORY_CARE_PROVIDER_SITE_OTHER): Payer: Self-pay | Admitting: Neurology

## 2016-07-22 ENCOUNTER — Encounter: Payer: Self-pay | Admitting: Neurology

## 2016-07-22 VITALS — BP 111/80 | HR 77 | Ht 72.0 in | Wt 289.4 lb

## 2016-07-22 DIAGNOSIS — I6930 Unspecified sequelae of cerebral infarction: Secondary | ICD-10-CM

## 2016-07-22 NOTE — Patient Instructions (Signed)
I had a long d/w patient about his remote stroke, risk for recurrent stroke/TIAs, personally independently reviewed imaging studies and stroke evaluation results and answered questions.Continue aspirin 81 mg daily and clopidogrel 75 mg daily  given recent cardiac stent for secondary stroke prevention and maintain strict control of hypertension with blood pressure goal below 130/90, diabetes with hemoglobin A1c goal below 6.5% and lipids with LDL cholesterol goal below 70 mg/dL. I also advised the patient to eat a healthy diet with plenty of whole grains, cereals, fruits and vegetables, exercise regularly and maintain ideal body weight Followup in the future with me in the future only as necessary and no routine schedule appointment was made.

## 2016-07-22 NOTE — Progress Notes (Signed)
[facility] [address] [place]. [place] [address] [phone]       OFFICE FOLLOW-UP NOTE  Colton. Colton [name]. Date of Birth:  Jun 04, 1955 Medical Record Number:  [id number]   ION:[id number] visit 12/28/2015 :  Colton [name] is a 38 year Caucasian male seen for first office follow-up visit following hospital admission for stroke like symptoms in June 2017.Colton [name]. is an 61 y.o. male with a history of hypertension, gout and discoid lupus, presented to the ED at [facility] following acute onset of slurred speech and weakness of lower extremities. Patient was last known well at 4:00 PM on 10/28/2015. He has no previous history of stroke nor TIA. CT scan of his head showed no acute intracranial abnormality. He was evaluated by telemetry neurology and deemed to be a candidate for IV TPA which was administered. Patient's deficits resolved subsequently. There were no apparent complications. He was transferred to [facility] for further management. NIH stroke score at the time of this evaluation was 0. CT scan of the head on admission showed no acute abnormality and MRI scan showed artifactual due to motion degraded exam but no definite acute infarct are noted. There are mild changes of small vessel disease. Intracranial MRA is also degraded by motion but no large vessel stenosis was noted. Carotid ultrasound showed no significant extracranial stenosis. Transthoracic echo showed decreased ejection fraction of 35-40%. LDL cholesterol was slightly elevated at 82 mg percent and hemoglobin A1c was borderline at 6.2. Patient was started on aspirin and Lipitor 20 mg daily. Patient had a flareup of acute gout involving his great toe in the hospitalization for which was started on colchicine and allopurinol. Patient was counseled to quit smoking which he says his done completely. He states his symptoms recurred completely. Patient had a family history of bad reaction to statins in his mother and dad and  hence he discontinued Lipitor . He however plans to diet and exercise andweight. His gout flareup etc. down. Patient states his bring quite well but only occasionally has some word finding difficulties and increased alkaline phosphatase. He has some intermittent numbness in the right face which is triggered by stress. He admits that he has a very stressful job and he trades crude oil for large shipments and has been under a lot of stress in recent months due to falling prices upgoing. Update 07/22/2016 : He returns for follow-up after last visit 6 months ago. He continues to do well from neurovascular standpoint without recurrent stroke or TIA symptoms. He underwent cardiac MRI and cardiac cath and eventually had a coronary stent placed in September 2017. He states is feeling much better after that. He has more energy and he feels stronger overall. He is on aspirin and Plavix and tolerating it well without bleeding or bruising. He states his cholesterol is under good control and starting Lipitor without muscle aches or pains. His blood pressure is well controlled and today it is 111/80. He has started eating healthy and he plans to exercise regularly. His partner stationary bike recently. He is also lost some weight recently He has no new neurological complaints ROS:   14 system review of systems is positive for  no complaints today and all other systems negative PMH:  Past Medical History:  Diagnosis Date  . Coronary artery disease    cath 01/2016 with 60% LAD with FFR 0.82 and 80% mid LCX s/p PCI  . Dilated cardiomyopathy (HCC)    EF 35-40% by echo  .  Discoid lupus   . Fatty liver   . Former smoker   . [facility]  . History of gout   . Hyperlipidemia LDL goal <70   . Hypertension   . Rosacea   . Stroke [facility])    s/p tpa 2017 w/o residual defecit    Social History:  Social History   Social History  . Marital status: Widowed    Spouse name: N/A  . Number of children: 0    . Years of education: N/A   Occupational History  . Property Patents/Inventions/Patents    Social History Main Topics  . Smoking status: Former Smoker    Packs/day: 2.00    Years: 45.00    Types: Cigarettes    Quit date: 10/28/2015  . Smokeless tobacco: Never Used  . Alcohol use 1.2 oz/week    1 Glasses of wine, 1 Shots of liquor per week  . Drug use: No  . Sexual activity: Not Currently   Other Topics Concern  . 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 allopurinol (ZYLOPRIM) 100 MG tablet one tablet daily  6  . aspirin 81 MG chewable tablet Chew 1 tablet (81 mg total) by mouth daily.    [name]. atorvastatin (LIPITOR) 20 MG tablet Take 1 tablet (20 mg total) by mouth daily. 30 tablet 11  . carvedilol (COREG) 12.5 MG tablet Take 1 tablet (12.5 mg total) by mouth 2 (two) times daily. 60 tablet 11  . clopidogrel (PLAVIX) 75 MG tablet Take 1 tablet (75 mg total) by mouth daily with breakfast. 90 tablet 3  . colchicine 0.6 MG tablet Take 1 tablet (0.6 mg total) by mouth daily as needed (gout). 30 tablet 6  . nitroGLYCERIN (NITROSTAT) 0.4 MG SL tablet Place 1 tablet (0.4 mg total) under the tongue every 5 (five) minutes as needed for chest pain. 25 tablet 2  . omega-3 acid ethyl esters (LOVAZA) 1 g capsule Take 2 capsules (2 g total) by mouth 2 (two) times daily. 120 capsule 11  . sacubitril-valsartan (ENTRESTO) 97-103 MG Take 1 tablet by mouth 2 (two) times daily. 60 tablet 5  . spironolactone (ALDACTONE) 25 MG tablet Take 0.5 tablets (12.5 mg total) by mouth daily. (Patient taking differently: Take 25 mg by mouth daily. ) 30 tablet 11  . Tetrahydrozoline HCl (VISINE OP) Place 1 drop into both eyes as needed (for dry eyes).     No current  facility-administered medications on file prior to visit.     Allergies:   Allergies  Allergen Reactions  . Corticosteroids Other (See Comments)    Muscle spasms  . Latex Rash    Physical Exam General: Mildly obese middle-age Caucasian male, seated, in no evident distress Head: head normocephalic and atraumatic.  Neck: supple with no carotid or supraclavicular bruits Cardiovascular: regular rate and rhythm, no murmurs Musculoskeletal: no deformity Skin:  no rash/petichiae Vascular:  Normal pulses all extremities Vitals:   07/22/16 1147  BP: 111/80  Pulse: 77   Neurologic Exam Mental Status: Awake and fully alert. Oriented to place and time. Recent and remote memory intact. Attention span, concentration and fund of knowledge appropriate. Mood and affect appropriate.  Cranial Nerves: Fundoscopic exam not done  Pupils equal, briskly reactive to light. Extraocular movements full without nystagmus. Visual fields full to confrontation. Hearing intact. Facial sensation intact. Face, tongue, palate moves normally and symmetrically.  Motor: Normal bulk and tone. Normal strength in all tested extremity muscles. Sensory.: intact to touch ,pinprick .position and vibratory sensation.  Coordination: Rapid alternating movements normal in all extremities. Finger-to-nose and heel-to-shin performed accurately bilaterally. Gait and Station: Arises from chair without difficulty. Stance is normal. Gait demonstrates normal stride length and balance . Able to heel, toe and tandem walk without difficulty.  Reflexes: 1+ and symmetric. Toes downgoing.       ASSESSMENT: 61 year old Caucasian male with strokelike episode in June 2017 treated with IV TPA with full recovery and MRI showing no definite infarct. Vascular risk factors of obesity, hypertension hyperlipidemia    PLAN: I had a long d/w patient about his remote stroke, risk for recurrent stroke/TIAs, personally independently reviewed imaging  studies and stroke evaluation results and answered questions.Continue aspirin 81 mg daily and clopidogrel 75 mg daily  given recent cardiac stent for secondary stroke prevention and maintain strict control of hypertension with blood pressure goal below 130/90, diabetes with hemoglobin A1c goal below 6.5% and lipids with LDL cholesterol goal below 70 mg/dL. I also advised the patient to eat a healthy diet with plenty of whole grains, cereals, fruits and vegetables, exercise regularly and maintain ideal body weight Followup in the future with me in the future only as necessary and no routine schedule appointment was made. Greater than 50% of time during this 25 minute visit was spent on counseling,explanation of diagnosis, planning of further management, discussion with patient and family and coordination of care Delia Heady, MD  [facility] [address] [place], [place] [address]  Phone [phone] Fax [phone] Note: This document was prepared with digital dictation and possible smart phrase technology. Any transcriptional errors that result from this process are unintentional

## 2016-07-25 ENCOUNTER — Telehealth: Payer: Self-pay

## 2016-07-25 NOTE — Telephone Encounter (Signed)
Letter received from [employer] Patient [employer]. He is eligible to receive Entrestro until 07/21/2017 at no OOP costs.

## 2016-07-31 ENCOUNTER — Telehealth: Payer: Self-pay | Admitting: Cardiology

## 2016-07-31 NOTE — Telephone Encounter (Signed)
New message     Pt is asking for RN to call. He would not give me a message.

## 2016-07-31 NOTE — Telephone Encounter (Signed)
Patient requests samples of Entresto placed until Patient Assistance sends him his meds. Samples placed at front desk for patient pick-up.

## 2016-08-01 ENCOUNTER — Telehealth: Payer: Self-pay | Admitting: Cardiology

## 2016-08-01 DIAGNOSIS — I10 Essential (primary) hypertension: Secondary | ICD-10-CM

## 2016-08-01 DIAGNOSIS — E669 Obesity, unspecified: Secondary | ICD-10-CM

## 2016-08-01 NOTE — Telephone Encounter (Signed)
Patient is requesting call back, would not go into details. Thanks.

## 2016-08-01 NOTE — Telephone Encounter (Signed)
Patient called to request [name] refer him to a dietician to lose weight. Instructed him to try PCP, but he would prefer [name] to place the order - he is leery of his PCP.  He understands he will be called next week with Dr. Norris Crossurner's recommendations.

## 2016-08-02 NOTE — Telephone Encounter (Signed)
Order placed for referral to Nutrition and Diabetes services for weight loss.

## 2016-08-02 NOTE — Telephone Encounter (Signed)
OK to refer to dietician- [facility] find [facility] if [facility] any dieticians that see outpts

## 2016-08-08 NOTE — Telephone Encounter (Signed)
Follow Up Call  Colton [name] is calling wanting to speak you . States that you will know what he is talking about . Thanks

## 2016-08-12 NOTE — Telephone Encounter (Signed)
Informed patient that referral to Dietician was placed and he will be called by them to schedule. He was grateful for call and agrees with treatment plan.

## 2016-08-26 ENCOUNTER — Telehealth: Payer: Self-pay | Admitting: Cardiology

## 2016-08-26 NOTE — Telephone Encounter (Signed)
New message    Pt is calling asking that nurse call him. He would not say what about it.

## 2016-08-26 NOTE — Telephone Encounter (Signed)
Pt does not weigh daily, denies shortness of breath, denies puffiness in abdomen or lips/face.  Pt advised to keep feet and legs elevated during day, limit sodium in diet,especially since he is out of town, monitor symptoms. Pt requesting appt with Dr Mayford Knife when he returns to [facility] 08/30/16. Pt has been scheduled to see Dr Mayford Knife 08/30/16 at 9:40 AM.

## 2016-08-26 NOTE — Telephone Encounter (Signed)
Pt states he is currently at [facility]. Pt states he has some swelling in his hands and feet, amount of swelling varies during the day.

## 2016-08-29 NOTE — Progress Notes (Signed)
Cardiology Office Note    Date:  08/30/2016   ID:  Colton Mechanic., DOB [birth year], MRN [id number]  PCP:  Elsie Stain, MD  Cardiologist:  Fransico Him, MD   Chief Complaint  Patient presents with  . Coronary Artery Disease  . Hypertension  . Hyperlipidemia  . Cardiomyopathy    History of Present Illness:  Colton Liby. is a 61 y.o. male with a history ofCVA 10/2015 treated with IV TPA,  moderate LV dysfunction with EF 35-40%, ASCAD with cath 02/06/16 showing 80% mid CFX lesion, treated with a DES, and a 60% LAD lesion, not significant by FFR.  The pt declined high dose statin Rx. Repeat 2D echo 04/10/2016 showed EF 35-40% with diffuse HK.  Cardiac MRI showed EF 42% with HK of the mid anterior wall and late gad uptake in the endocardium of the basal and mid anterior walls c/w prior non-transmural infarct.  He is here today for followup and is doing well today.   He denies any anginal CP.  He has chronic SO.B  That is intermittent and mainly occurs after walking a long distance and is stable.  He denies any PND or orthopnea but a few times a week he will notice some mild LE edema.  He has not had any palpitations, dizziness, claudication or syncope.  He is tolerating his meds and is compliant.  He is very frustrated with his continued weight gain.  He met with a nutritionist this am.      Past Medical History:  Diagnosis Date  . Coronary artery disease    cath 01/2016 with 60% LAD with FFR 0.82 and 80% mid LCX s/p PCI  . Dilated cardiomyopathy ([place])    EF 35-40% by echo  . Discoid lupus   . Fatty liver   . Former smoker   . Gout 07/2001   [facility]  . History of gout   . Hyperlipidemia LDL goal <70   . Hypertension   . Rosacea   . Stroke [facility])    s/p tpa 2017 w/o residual defecit    Past Surgical History:  Procedure Laterality Date  . CARDIAC CATHETERIZATION N/A 02/06/2016   Procedure: Left Heart Cath and Coronary Angiography;  Surgeon: Jettie Booze, MD;  Location: [place] [facility];  Service: Cardiovascular;  Laterality: N/A;  . CARDIAC CATHETERIZATION N/A 02/06/2016   Procedure: Coronary Stent Intervention;  Surgeon: Jettie Booze, MD;  Location: [place] [facility];  Service: Cardiovascular;  Laterality: N/A;  . CARDIAC CATHETERIZATION N/A 02/06/2016   Procedure: Intravascular Pressure Wire/FFR Study;  Surgeon: Jettie Booze, MD;  Location: [place] [facility];  Service: Cardiovascular;  Laterality: N/A;  . Cardiolite  08/16/2008   [facility]    Current Medications: Current Meds  Medication Sig  . allopurinol (ZYLOPRIM) 100 MG tablet one tablet daily  . aspirin 81 MG chewable tablet Chew 1 tablet (81 mg total) by mouth daily.  [name] atorvastatin (LIPITOR) 20 MG tablet Take 1 tablet (20 mg total) by mouth daily.  . carvedilol (COREG) 12.5 MG tablet Take 1 tablet (12.5 mg total) by mouth 2 (two) times daily.  . clopidogrel (PLAVIX) 75 MG tablet Take 1 tablet (75 mg total) by mouth daily with breakfast.  . colchicine 0.6 MG tablet Take 1 tablet (0.6 mg total) by mouth daily as needed (gout).  . nitroGLYCERIN (NITROSTAT) 0.4 MG SL tablet Place 1 tablet (0.4 mg total) under the tongue every 5 (five) minutes as  needed for chest pain.  [name] omega-3 acid ethyl esters (LOVAZA) 1 g capsule Take 2 capsules (2 g total) by mouth 2 (two) times daily.  . sacubitril-valsartan (ENTRESTO) 97-103 MG Take 1 tablet by mouth 2 (two) times daily.  [name] spironolactone (ALDACTONE) 25 MG tablet Take 0.5 tablets (12.5 mg total) by mouth daily.  . Tetrahydrozoline HCl (VISINE OP) Place 1 drop into both eyes as needed (for dry eyes).    Allergies:   Corticosteroids and Latex   Social History   Social History  . Marital status: Widowed    Spouse name: N/A  . Number of children: 0  . Years of education: N/A   Occupational History  . Property Patents/Inventions/Patents    Social History Main Topics  . Smoking status: Former Smoker     Packs/day: 2.00    Years: 45.00    Types: Cigarettes    Quit date: 10/28/2015  . Smokeless tobacco: Never Used  . Alcohol use 1.2 oz/week    1 Glasses of wine, 1 Shots of liquor per week  . Drug use: No  . Sexual activity: Not Currently   Other Topics Concern  . None   Social History Narrative   [employer] '78-'79   Prev investment banking, now trades crude Programme researcher, broadcasting/film/video   Widowed, wife died of mesothelioma   No kids     Family History:  The patient's family history includes CAD in his father and other; Heart disease in his father and other; Lung cancer in his other.   ROS:   Please see the history of present illness.    ROS All other systems reviewed and are negative.  No flowsheet data found.     PHYSICAL EXAM:   VS:  BP 122/70   Pulse 73   Ht 6' (1.829 m)   Wt 294 lb 8 oz (133.6 kg)   SpO2 96%   BMI 39.94 kg/m    GEN: Well nourished, well developed, in no acute distress  HEENT: normal  Neck: no JVD, carotid bruits, or masses Cardiac: RRR; no murmurs, rubs, or gallops,no edema.  Intact distal pulses bilaterally.  Respiratory:  clear to auscultation bilaterally, normal work of breathing GI: soft, nontender, nondistended, + BS MS: no deformity or atrophy  Skin: warm and dry, no rash Neuro:  Alert and Oriented x 3, Strength and sensation are intact Psych: euthymic mood, full affect  Wt Readings from Last 3 Encounters:  08/30/16 294 lb 8 oz (133.6 kg)  08/30/16 294 lb 12.8 oz (133.7 kg)  07/22/16 289 lb 6.4 oz (131.3 kg)      Studies/Labs Reviewed:   EKG:  EKG is not ordered today.    Recent Labs: 12/18/2015: TSH 0.93 02/13/2016: Hemoglobin 14.2; Platelets 149 05/10/2016: BUN 16; Potassium 4.7; Sodium 138 06/07/2016: Creatinine, Ser 1.16 06/18/2016: ALT 47   Lipid Panel    Component Value Date/Time   CHOL 117 06/18/2016 0733   TRIG 190 (H) 06/18/2016 0733   HDL 26 (L) 06/18/2016 0733   CHOLHDL 4.5 06/18/2016 0733   CHOLHDL 5.8 (H) 04/10/2016 0917     VLDL 75 (H) 04/10/2016 0917   LDLCALC 53 06/18/2016 0733   LDLDIRECT 104.4 05/12/2008 1304    Additional studies/ records that were reviewed today include:  none    ASSESSMENT:    1. Coronary artery disease due to lipid rich plaque   2. DCM (dilated cardiomyopathy) ([place])   3. Essential hypertension   4. Hyperlipidemia with target LDL less than 70  5. Morbid obesity ([place])      PLAN:  In order of problems listed above:  1. ASCAD - cath 02/06/16 showed 80% mid CFX lesion, treated with a DES, and a 60% LAD lesion, not significant by FFR.  He has not had any anginal symptoms.  He will continue on DAPT with ASA/Plavix, statin and Coreg.  2. DCM - EF 42% by cardiac MRI.  He appears euvolemic on exam today and his weight is stable.  He will continue with Coreg, Entresto, spironolactone.  I will check a BMET.  3.   HTN - BP is controlled on current meds. He will continue ARB and BB.  4.   Hyperlipidemia with LDL goal < 70.  He has refused higher doses of statins in the past  His last LDL was 21 in November but HDL low at 21.  Continue Atorvastatin and Lovaza.  5.   Morbid obesity - he is frustrated by his weight gain.  I suspect this is due to eating out 2 meals a day as well as dietary indiscretion with sodium.  I encouraged him to eat more fruits and vegetables and consider joining a gym and getting training.    Medication Adjustments/Labs and Tests Ordered: Current medicines are reviewed at length with the patient today.  Concerns regarding medicines are outlined above.  Medication changes, Labs and Tests ordered today are listed in the Patient Instructions below.  There are no Patient Instructions on file for this visit.   Signed, Fransico Him, MD  08/30/2016 9:54 AM    [facility] [place], [place],   [address] Phone: [phone]; Fax: [phone]

## 2016-08-30 ENCOUNTER — Ambulatory Visit (INDEPENDENT_AMBULATORY_CARE_PROVIDER_SITE_OTHER): Payer: Self-pay | Admitting: Cardiology

## 2016-08-30 ENCOUNTER — Encounter: Payer: Self-pay | Admitting: Cardiology

## 2016-08-30 ENCOUNTER — Encounter: Payer: Self-pay | Admitting: Registered"

## 2016-08-30 ENCOUNTER — Encounter: Payer: Self-pay | Attending: Cardiology | Admitting: Registered"

## 2016-08-30 VITALS — BP 122/70 | HR 73 | Ht 72.0 in | Wt 294.5 lb

## 2016-08-30 DIAGNOSIS — I2583 Coronary atherosclerosis due to lipid rich plaque: Secondary | ICD-10-CM

## 2016-08-30 DIAGNOSIS — M109 Gout, unspecified: Secondary | ICD-10-CM | POA: Insufficient documentation

## 2016-08-30 DIAGNOSIS — Z8673 Personal history of transient ischemic attack (TIA), and cerebral infarction without residual deficits: Secondary | ICD-10-CM | POA: Insufficient documentation

## 2016-08-30 DIAGNOSIS — I2510 Atherosclerotic heart disease of native coronary artery without angina pectoris: Secondary | ICD-10-CM

## 2016-08-30 DIAGNOSIS — Z713 Dietary counseling and surveillance: Secondary | ICD-10-CM | POA: Insufficient documentation

## 2016-08-30 DIAGNOSIS — E785 Hyperlipidemia, unspecified: Secondary | ICD-10-CM

## 2016-08-30 DIAGNOSIS — I420 Dilated cardiomyopathy: Secondary | ICD-10-CM

## 2016-08-30 DIAGNOSIS — I10 Essential (primary) hypertension: Secondary | ICD-10-CM

## 2016-08-30 DIAGNOSIS — E669 Obesity, unspecified: Secondary | ICD-10-CM

## 2016-08-30 NOTE — Patient Instructions (Signed)
Medication Instructions:  Your physician recommends that you continue on your current medications as directed. Please refer to the Current Medication list given to you today.   Labwork: TODAY: BMET, TSH  Testing/Procedures: None  Follow-Up: Your physician wants you to follow-up in: 6 months with Dr. Mayford Knife. You will receive a reminder letter in the mail two months in advance. If you don't receive a letter, please call our office to schedule the follow-up appointment.   Any Other Special Instructions Will Be Listed Below (If Applicable).     If you need a refill on your cardiac medications before your next appointment, please call your pharmacy.

## 2016-08-30 NOTE — Progress Notes (Signed)
Medical Nutrition Therapy:  Appt start time: 0815 end time:  0915.   Assessment:  Primary concerns today: Pt states he is interested in weight loss. Pt reports he weighed 245 lbs 1 year ago gaining ~50 lb since then. Pt states he had a stroke in June, has gout and heart issues. Pt states he feels fine but doesn't like having the extra weight.  Pt states he has a lot of diet restrictions due to gout which he has had since he was in his 51s and used the Internet to educate himself on what to eat. Pt also reports in the past he lost 60 lbs on the Atkins diet. Pt states he drinks 32 oz of high alkaline water/day.  Pt reports that stress due to work and after he had the stent placement as contributed to stress eating and consuming more sweets than usual.  Preferred Learning Style:   No preference indicated   Learning Readiness:   Ready  MEDICATIONS: reviewed (pt states the omega 3 supplement has made a big difference in his lipid panel)   DIETARY INTAKE:  Usual eating pattern includes 3 meals and 2 snacks per day.  Avoided foods include (due to gout) organ meats, shell fish, anchovies, beer, oatmeal.   24-hr recall:  B ( AM): raisin toast, cream cheese (tries to limits) 1-2 full breakfast 2 eggs, sausage, rye or wheat toast Snk ( AM): none  L ( PM): 50% time salad from [employer] w tuna fish OR cheese burger OR grilled cheese Snk ( PM): none OR choc cookies OR popcorn D ( PM): salad OR steak, sm salad, broccoli, sweet potato (Outback) Snk ( PM): 1/2 pimento cheese, sm bowl of cereal  Beverages: water, coffee, unsweet tea with truvia, sometimes with lunch pepsi  Usual physical activity: not assessed  Estimated energy needs: 2000 calories 225 g carbohydrates 150 g protein 56 g fat  Progress Towards Goal(s):  In progress.   Nutritional Diagnosis:  NB-1.1 Food and nutrition-related knowledge deficit As related to role of carbs in diet.  As evidenced by pt reported belief that needs to  cut out carbs.    Intervention:  Nutrition Education. Discussed healthy and unhealthy weight loss. Discussed the concept of weight cycling. Discussed food groups and roles in health.   Plan:  Consider visiting a counselor for the stress eating  Breakfast ideas: 2 eggs whites and 1 yoke & apple, smoothies  Lunch ideas: salads with protein  Consider reducing the amount of cheese in diet.  Consider switching out the soda with unsweet tea  [email or web address]  Teaching Method Utilized: Visual Auditory Hands on  Handouts given during visit include:  My Plate Planner  Barriers to learning/adherence to lifestyle change: none  Demonstrated degree of understanding via:  Teach Back   Monitoring/Evaluation:  Dietary intake, exercise, and body weight prn.

## 2016-08-30 NOTE — Patient Instructions (Addendum)
Plan:  Consider visiting a counselor for the stress eating  Breakfast ideas: 2 eggs whites and 1 yoke & apple, smoothies Lunch ideas: salads with protein Consider reducing the amount of cheese in diet. Consider switching out the soda with unsweet tea  [email or web address]

## 2016-09-02 LAB — BASIC METABOLIC PANEL
BUN / CREAT RATIO: 14 (ref 10–24)
BUN: 15 mg/dL (ref 8–27)
CALCIUM: 8.8 mg/dL (ref 8.6–10.2)
CHLORIDE: 101 mmol/L (ref 96–106)
CO2: 21 mmol/L (ref 18–29)
Creatinine, Ser: 1.07 mg/dL (ref 0.76–1.27)
GFR, EST AFRICAN AMERICAN: 86 mL/min/{1.73_m2} (ref >59)
GFR, EST NON AFRICAN AMERICAN: 75 mL/min/{1.73_m2} (ref >59)
Glucose: 111 mg/dL — ABNORMAL HIGH (ref 65–99)
POTASSIUM: 4.6 mmol/L (ref 3.5–5.2)
SODIUM: 139 mmol/L (ref 134–144)

## 2016-09-02 LAB — TSH: TSH: 2.05 u[IU]/mL (ref 0.450–4.500)

## 2016-11-19 IMAGING — NM NM MISC PROCEDURE
3 series · 18 of 18 positions shown · non-contrast
Comparison: none

[Series 1: rest_(id)_sa · 6.4mm · 6.40mm/px · 6 of 64 frames shown]
[frame 6/64]
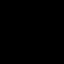
[frame 16/64]
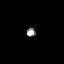
[frame 27/64]
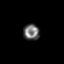
[frame 38/64]
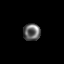
[frame 48/64]
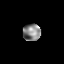
[frame 59/64]
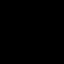

[Series 1: stress-gsp_(id)_sa · 6.4mm · 6.40mm/px · 6 of 512 frames shown]
[frame 43/512]
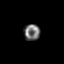
[frame 128/512]
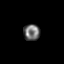
[frame 214/512]
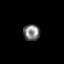
[frame 299/512]
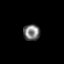
[frame 384/512]
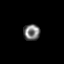
[frame 470/512]
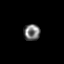

[Series 1: stress-sum-em_(id)_sa · 6.4mm · 6.40mm/px · 6 of 64 frames shown]
[frame 6/64]
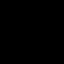
[frame 16/64]
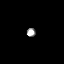
[frame 27/64]
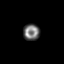
[frame 38/64]
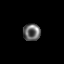
[frame 48/64]
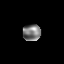
[frame 59/64]
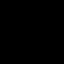

[18 of 18 positions shown; findings below may reference images not displayed]

Canned report from images found in remote index.

Refer to host system for actual result text.

## 2017-01-17 ENCOUNTER — Telehealth: Payer: Self-pay | Admitting: Cardiology

## 2017-01-17 NOTE — Telephone Encounter (Signed)
New message       Pt is requesting a callback from the nurse.  He would not tell me what he wanted

## 2017-01-17 NOTE — Telephone Encounter (Signed)
Pt needs to travel by plane next week and calling to make sure it is safe to fly, being that he received PCI last September. Informed pt he is ok to fly. Patient verbalized understanding and agreeable to plan.

## 2017-02-03 ENCOUNTER — Other Ambulatory Visit: Payer: Self-pay | Admitting: Cardiology

## 2017-02-04 ENCOUNTER — Other Ambulatory Visit: Payer: Self-pay

## 2017-02-04 MED ORDER — CLOPIDOGREL BISULFATE 75 MG PO TABS: 75.0000 mg | ORAL_TABLET | Freq: Every day | ORAL | 1 refills | Status: DC

## 2017-02-04 MED ORDER — CARVEDILOL 12.5 MG PO TABS: 12.5000 mg | ORAL_TABLET | Freq: Two times a day (BID) | ORAL | 1 refills | Status: DC

## 2017-02-04 NOTE — Telephone Encounter (Signed)
carvedilol (COREG) 12.5 MG tablet Take 1 tablet (12.5 mg total) by mouth 2 (two) times daily

## 2017-02-27 ENCOUNTER — Other Ambulatory Visit: Payer: Self-pay | Admitting: Cardiology

## 2017-06-02 ENCOUNTER — Other Ambulatory Visit: Payer: Self-pay | Admitting: *Deleted

## 2017-06-02 NOTE — Telephone Encounter (Signed)
Patient is requesting to pick 90 day rx's up from the office. I made him aware that it would probably be later in the week before he would be able to pick them up as Dr Mayford Knifeurner isn't in the office daily. He verbalized his understanding and stated that would be fine. He requested a call at [phone] when they are available. Thanks, MI

## 2017-06-03 ENCOUNTER — Telehealth: Payer: Self-pay

## 2017-06-03 MED ORDER — CLOPIDOGREL BISULFATE 75 MG PO TABS: 75.0000 mg | ORAL_TABLET | Freq: Every day | ORAL | 1 refills | Status: DC

## 2017-06-03 MED ORDER — ATORVASTATIN CALCIUM 20 MG PO TABS: ORAL_TABLET | ORAL | 5 refills | Status: DC

## 2017-06-03 MED ORDER — CARVEDILOL 12.5 MG PO TABS: 12.5000 mg | ORAL_TABLET | Freq: Two times a day (BID) | ORAL | 1 refills | Status: DC

## 2017-06-03 MED ORDER — SPIRONOLACTONE 25 MG PO TABS: 12.5000 mg | ORAL_TABLET | Freq: Every day | ORAL | 11 refills | Status: DC

## 2017-06-03 MED ORDER — OMEGA-3-ACID ETHYL ESTERS 1 G PO CAPS: 2.0000 g | ORAL_CAPSULE | Freq: Two times a day (BID) | ORAL | 11 refills | Status: DC

## 2017-06-03 MED ORDER — SACUBITRIL-VALSARTAN 97-103 MG PO TABS: 1.0000 | ORAL_TABLET | Freq: Two times a day (BID) | ORAL | 5 refills | Status: DC

## 2017-06-03 NOTE — Telephone Encounter (Signed)
Follow up  Patient would like to pick up the written prescriptions today if possible.   Patient needs written prescription for 90 day supply   sacubitril-valsartan (ENTRESTO) 97-103 MG Take 1 tablet by mouth 2 (two) times daily.   omega-3 acid ethyl esters (LOVAZA) 1 g capsule Take 2 capsules (2 g total) by mouth 2 (two) times daily.   spironolactone (ALDACTONE) 25 MG tablet Take 0.5 tablets (12.5 mg total) by mouth daily   (30 day supply for spironolactone)  clopidogrel (PLAVIX) 75 MG tablet Take 1 tablet (75 mg total) by mouth daily with breakfast.   carvedilol (COREG) 12.5 MG tablet Take 1 tablet (12.5 mg total) by mouth 2 (two) times daily.   atorvastatin (LIPITOR) 20 MG tablet TAKE 1 TABLET(20 MG) BY MOUTH DAILY   (30 day supply of the atorvastatin)

## 2017-06-03 NOTE — Progress Notes (Signed)
Cardiology Office Note:    Date:  06/04/2017   ID:  Colton [name] Margerum Jr., DOB June 04, 1955, MRN [id number]  PCP:  Joaquim Namuncan, Graham S, MD  Cardiologist:  Armanda Magicraci Damarcus Reggio, MD   Referring MD: Joaquim Namuncan, Graham S, MD   Chief Complaint  Patient presents with  . Coronary Artery Disease  . Cardiomyopathy  . Hypertension  . Hyperlipidemia    History of Present Illness:    Colton [name] Coonrod Jr. is a 62 y.o. 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 The pt declined high dose statin Rx. Repeat 2D echo 04/10/2016 showed EF 35-40% with diffuse HK.  Cardiac MRI showed EF 42% with HK of the mid anterior wall and late gad uptake in the endocardium of the basal and mid anterior walls c/w prior non-transmural infarct.  He is here today for followup and is doing well. He has gained about 7lbs since last year.  He denies any chest pain or pressure, PND, orthopnea,  dizziness, palpitations or syncope. He does have chronic DOE which he thinks is stable. Occasionally he will still have LE edema but is not really a bother.   He is compliant with his meds and is tolerating meds with no SE.     Past Medical History:  Diagnosis Date  . Coronary artery disease    cath 01/2016 with 60% LAD with FFR 0.82 and 80% mid LCX s/p PCI  . Dilated cardiomyopathy (HCC)    EF 35-40% by echo  . Discoid lupus   . Fatty liver   . Former smoker   . [facility]  . History of gout   . Hyperlipidemia LDL goal <70   . Hypertension   . Rosacea   . Stroke [facility](HCC)    s/p tpa 2017 w/o residual defecit    Past Surgical History:  Procedure Laterality Date  . CARDIAC CATHETERIZATION N/A 02/06/2016   Procedure: Left Heart Cath and Coronary Angiography;  Surgeon: Corky CraftsJayadeep S Varanasi, MD;  [facility];  Service:  Cardiovascular;  Laterality: N/A;  . CARDIAC CATHETERIZATION N/A 02/06/2016   Procedure: Coronary Stent Intervention;  Surgeon: Corky CraftsJayadeep S Varanasi, MD;  [facility];  Service: Cardiovascular;  Laterality: N/A;  . CARDIAC CATHETERIZATION N/A 02/06/2016   Procedure: Intravascular Pressure Wire/FFR Study;  Surgeon: Corky CraftsJayadeep S Varanasi, MD;  [facility];  Service: Cardiovascular;  Laterality: N/A;  . Cardiolite  08/16/2008   [facility]    Current Medications: Current Meds  Medication Sig  . allopurinol (ZYLOPRIM) 100 MG tablet one tablet daily  . aspirin 81 MG chewable tablet Chew 1 tablet (81 mg total) by mouth daily.  [name]. atorvastatin (LIPITOR) 20 MG tablet TAKE 1 TABLET(20 MG) BY MOUTH DAILY  . carvedilol (COREG) 12.5 MG tablet Take 1 tablet (12.5 mg total) by mouth 2 (two) times daily.  . clopidogrel (PLAVIX) 75 MG tablet Take 1 tablet (75 mg total) by mouth daily with breakfast.  . colchicine 0.6 MG tablet Take 1 tablet (0.6 mg total) by mouth daily as needed (gout).  . nitroGLYCERIN (NITROSTAT) 0.4 MG SL tablet Place 1 tablet (0.4 mg total) under the tongue every 5 (five) minutes as needed for chest pain.  [name]. omega-3 acid ethyl esters (LOVAZA) 1 g capsule Take 2  capsules (2 g total) by mouth 2 (two) times daily.  . sacubitril-valsartan (ENTRESTO) 97-103 MG Take 1 tablet by mouth 2 (two) times daily.  [name] spironolactone (ALDACTONE) 25 MG tablet Take 0.5 tablets (12.5 mg total) by mouth daily.  [name] spironolactone (ALDACTONE) 25 MG tablet Take 0.5 tablets (12.5 mg total) by mouth daily.  . Tetrahydrozoline HCl (VISINE OP) Place 1 drop into both eyes as needed (for dry eyes).     Allergies:   Corticosteroids and Latex   Social History   Socioeconomic History  . Marital status: Widowed    Spouse name: None  . Number of children: 0  . Years of education: None  . Highest education level: None  Social Needs  . Financial resource strain: None  . Food  insecurity - worry: None  . Food insecurity - inability: None  . Transportation needs - medical: None  . Transportation needs - non-medical: None  Occupational History  . 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 Smoking status: Former Smoker    Packs/day: 2.00    Years: 45.00    Pack years: 90.00    Types: Cigarettes    Last attempt to quit: 10/28/2015    Years since quitting: 1.6  . Smokeless tobacco: Never Used  Substance and Sexual Activity  . Alcohol use: Yes    Alcohol/week: 1.2 oz    Types: 1 Glasses of wine, 1 Shots of liquor per week  . Drug use: No  . Sexual activity: Not Currently  Other Topics Concern  . None  Social History Narrative   [employer] '78-'79   Prev investment banking, now trades crude [employer]   Widowed, wife died of mesothelioma   No kids     Family History: The patient's family history includes CAD in his father and other; Heart disease in his father and other; Lung cancer in his other. There is no history of Colon cancer or Prostate cancer.  ROS:   Please see the history of present illness.    ROS  All other systems reviewed and negative.   EKGs/Labs/Other Studies Reviewed:    The following studies were reviewed today: none  EKG:  EKG is  ordered today.  The ekg ordered today demonstrates NSR at 66bpm with IRBB and nonspecific ST abnormality  Recent Labs: 06/18/2016: ALT 47 08/30/2016: BUN 15; Creatinine, Ser 1.07; Potassium 4.6; Sodium 139; TSH 2.050   Recent Lipid Panel    Component Value Date/Time   CHOL 117 06/18/2016 0733   TRIG 190 (H) 06/18/2016 0733   HDL 26 (L) 06/18/2016 0733   CHOLHDL 4.5 06/18/2016 0733   CHOLHDL 5.8 (H) 04/10/2016 0917   VLDL 75 (H) 04/10/2016 0917   LDLCALC 53 06/18/2016 0733   LDLDIRECT 104.4 05/12/2008 1304    Physical Exam:    VS:  BP 108/82   Pulse 66   Ht 6' (1.829 m)   Wt 296 lb (134.3 kg)   SpO2 97%   BMI 40.14 kg/m     Wt Readings from Last 3 Encounters:    06/04/17 296 lb (134.3 kg)  08/30/16 294 lb 8 oz (133.6 kg)  08/30/16 294 lb 12.8 oz (133.7 kg)     GEN:  Well nourished, well developed in no acute distress HEENT: Normal NECK: No JVD; No carotid bruits LYMPHATICS: No lymphadenopathy CARDIAC: RRR, no murmurs, rubs, gallops RESPIRATORY:  Clear to auscultation without rales, wheezing or rhonchi  ABDOMEN: Soft, non-tender, non-distended MUSCULOSKELETAL:  No edema; No deformity  SKIN: Warm and dry NEUROLOGIC:  Alert and oriented x 3 PSYCHIATRIC:  Normal affect   ASSESSMENT:    1. DCM (dilated cardiomyopathy) (HCC)   2. Coronary artery disease due to lipid rich plaque   3. Essential hypertension   4. Hyperlipidemia with target LDL less than 70    PLAN:    In order of problems listed above:  1.  DCM - EF 35-40% by echo 2017 and 42% by cardiac MRI 05/2016.  This is likely mixed ischemic/nonischemic.  He has no evidence of volume overload on exam.  He will continue on carvedilol, Entresto and Spironolactone.  2.  ASCAD - cath 02/06/16 showing 80% mid CFX lesion, treated with a DES, and a 60% LAD lesion, not significant by FFR.  He has not had any anginal symptoms.  He will continue on ASA 81mg  daily, BB and Plavix 75mg  daily.  He has declined statins in the past.  3 .  HTN - BP is well controlled on exam today.  He will continue on spironolactone, carvedilol 12.5mg  BID and ENtresto 97-103mg  BID.    4.  Hyperlipidemia with LDL goal < 70.  He will continue on lipitor 20mg  daily. LDL a year ago was 53.  I will repeat FLP.   Medication Adjustments/Labs and Tests Ordered: Current medicines are reviewed at length with the patient today.  Concerns regarding medicines are outlined above.  No orders of the defined types were placed in this encounter.  No orders of the defined types were placed in this encounter.   Signed, Armanda Magic, MD  06/04/2017 8:34 AM    [facility]

## 2017-06-03 NOTE — Telephone Encounter (Signed)
Spoke with patient regarding refills. Patient states he needs a physical copy of Entresto to send to the company and [facility]. While on the phone patient requested to schedule 6 month f/u. Patient scheduled with [name] on 06/04/17. [facility]. Patient in agreement with plan, verbalized understanding and thanked me for the call.

## 2017-06-04 ENCOUNTER — Ambulatory Visit (INDEPENDENT_AMBULATORY_CARE_PROVIDER_SITE_OTHER): Payer: Self-pay | Admitting: Cardiology

## 2017-06-04 ENCOUNTER — Encounter: Payer: Self-pay | Admitting: Cardiology

## 2017-06-04 VITALS — BP 108/82 | HR 66 | Ht 72.0 in | Wt 296.0 lb

## 2017-06-04 DIAGNOSIS — I2510 Atherosclerotic heart disease of native coronary artery without angina pectoris: Secondary | ICD-10-CM

## 2017-06-04 DIAGNOSIS — E785 Hyperlipidemia, unspecified: Secondary | ICD-10-CM

## 2017-06-04 DIAGNOSIS — I420 Dilated cardiomyopathy: Secondary | ICD-10-CM

## 2017-06-04 DIAGNOSIS — I10 Essential (primary) hypertension: Secondary | ICD-10-CM

## 2017-06-04 DIAGNOSIS — I2583 Coronary atherosclerosis due to lipid rich plaque: Secondary | ICD-10-CM

## 2017-06-04 LAB — BASIC METABOLIC PANEL
BUN / CREAT RATIO: 15 (ref 10–24)
BUN: 20 mg/dL (ref 8–27)
CO2: 19 mmol/L — ABNORMAL LOW (ref 20–29)
Calcium: 8.9 mg/dL (ref 8.6–10.2)
Chloride: 102 mmol/L (ref 96–106)
Creatinine, Ser: 1.3 mg/dL — ABNORMAL HIGH (ref 0.76–1.27)
GFR, EST AFRICAN AMERICAN: 68 mL/min/{1.73_m2} (ref >59)
GFR, EST NON AFRICAN AMERICAN: 58 mL/min/{1.73_m2} — AB (ref >59)
Glucose: 124 mg/dL — ABNORMAL HIGH (ref 65–99)
POTASSIUM: 4.9 mmol/L (ref 3.5–5.2)
SODIUM: 138 mmol/L (ref 134–144)

## 2017-06-04 LAB — HEPATIC FUNCTION PANEL
ALT: 29 IU/L (ref 0–44)
AST: 20 IU/L (ref 0–40)
Albumin: 4.5 g/dL (ref 3.6–4.8)
Alkaline Phosphatase: 58 IU/L (ref 39–117)
Bilirubin Total: 0.5 mg/dL (ref 0.0–1.2)
Bilirubin, Direct: 0.18 mg/dL (ref 0.00–0.40)
Total Protein: 7.2 g/dL (ref 6.0–8.5)

## 2017-06-04 LAB — LIPID PANEL
CHOLESTEROL TOTAL: 130 mg/dL (ref 100–199)
Chol/HDL Ratio: 4.2 ratio (ref 0.0–5.0)
HDL: 31 mg/dL — AB (ref >39)
LDL Calculated: 67 mg/dL (ref 0–99)
TRIGLYCERIDES: 161 mg/dL — AB (ref 0–149)
VLDL Cholesterol Cal: 32 mg/dL (ref 5–40)

## 2017-06-04 MED ORDER — CARVEDILOL 12.5 MG PO TABS: 12.5000 mg | ORAL_TABLET | Freq: Two times a day (BID) | ORAL | 3 refills | Status: DC

## 2017-06-04 MED ORDER — ATORVASTATIN CALCIUM 20 MG PO TABS: ORAL_TABLET | ORAL | 3 refills | Status: DC

## 2017-06-04 MED ORDER — SACUBITRIL-VALSARTAN 97-103 MG PO TABS: 1.0000 | ORAL_TABLET | Freq: Two times a day (BID) | ORAL | 3 refills | Status: DC

## 2017-06-04 MED ORDER — SPIRONOLACTONE 25 MG PO TABS: 12.5000 mg | ORAL_TABLET | Freq: Every day | ORAL | 3 refills | Status: DC

## 2017-06-04 MED ORDER — OMEGA-3-ACID ETHYL ESTERS 1 G PO CAPS: 2.0000 g | ORAL_CAPSULE | Freq: Two times a day (BID) | ORAL | 3 refills | Status: DC

## 2017-06-04 MED ORDER — NITROGLYCERIN 0.4 MG SL SUBL: 0.4000 mg | SUBLINGUAL_TABLET | SUBLINGUAL | 2 refills | Status: DC | PRN

## 2017-06-04 MED ORDER — CLOPIDOGREL BISULFATE 75 MG PO TABS: 75.0000 mg | ORAL_TABLET | Freq: Every day | ORAL | 3 refills | Status: AC

## 2017-06-04 NOTE — Patient Instructions (Signed)
Medication Instructions:  Your physician recommends that you continue on your current medications as directed. Please refer to the Current Medication list given to you today.  Labwork: Today for kidney function, liver function and fasting lipids  Testing/Procedures: None ordered   Follow-Up: Your physician wants you to follow-up in: 6 months with [name]. You will receive a reminder letter in the mail two months in advance. If you don't receive a letter, please call our office to schedule the follow-up appointment.  Any Other Special Instructions Will Be Listed Below (If Applicable).     If you need a refill on your cardiac medications before your next appointment, please call your pharmacy.

## 2017-06-06 ENCOUNTER — Telehealth: Payer: Self-pay

## 2017-06-06 ENCOUNTER — Telehealth: Payer: Self-pay | Admitting: Family Medicine

## 2017-06-06 DIAGNOSIS — E785 Hyperlipidemia, unspecified: Secondary | ICD-10-CM

## 2017-06-06 NOTE — Telephone Encounter (Signed)
Per DPR left v/m advising pt [facility] does not have shingrix vaccine. If pt wants to ck with local pharmacy that has shingrix vaccine pt can call back and shingrix rx can be sent to pharmacy.

## 2017-06-06 NOTE — Telephone Encounter (Signed)
Notes recorded by Phineas Semenobertson, Zo Loudon, RN on 06/06/2017 at 10:08 AM EST Patient made aware of results. Patient verbalized understanding and thanked me for the call. Lipids ordered and patient scheduled on 10/03/17.     Notes recorded by Quintella Reicherturner, Traci R, MD on 06/04/2017 at 9:57 PM EST Triglycerides too high - please encourage low carb diet and increased exercise. Repeat lipids in 4 months

## 2017-06-06 NOTE — Telephone Encounter (Signed)
Copied from CRM [phone]#[id number]. Topic: Quick Communication - See Telephone Encounter >> Jun 06, 2017  2:28 PM Cipriano BunkerLambe, Annette S wrote: CRM for notification. See Telephone encounter for:   Patient is calling wanting a shingles shots (approved by Cardiologist [name])  Please call and let pt. Know  06/06/17.

## 2017-06-13 ENCOUNTER — Ambulatory Visit: Payer: Self-pay | Admitting: Family Medicine

## 2017-06-13 ENCOUNTER — Encounter: Payer: Self-pay | Admitting: Family Medicine

## 2017-06-13 VITALS — BP 98/62 | HR 73 | Temp 98.1°F | Wt 296.2 lb

## 2017-06-13 DIAGNOSIS — J029 Acute pharyngitis, unspecified: Secondary | ICD-10-CM | POA: Insufficient documentation

## 2017-06-13 LAB — POCT RAPID STREP A (OFFICE): RAPID STREP A SCREEN: NEGATIVE

## 2017-06-13 NOTE — Assessment & Plan Note (Signed)
D/w pt.  RST neg, likely viral. Nontoxic.   Supportive care.  Update me if not getting better or if prolonged sx.  Okay for outpatient f/u, with him to monitor his BP esp if lightheaded.   See AVS re: vaccination, d/w pt.

## 2017-06-13 NOTE — Progress Notes (Signed)
"  throat infection".  pain on the L side of the neck, radiating up to the L ear.  Mainly on the L, occ scratchy feeling on the R side of the neck and throat. Sx started about 5-6 days ago.  No fevers, no vomiting, diarrhea.  His ears don't feel congested.  Some rhinorrhea, at his baseline.  No cough, no sputum.  No facial pain.  Not SOB.  BP is lower today but he isn't lightheaded.  Not much post nasal gtt.  He can still pop his ears with valsalva.   H/o tonsil inflammation and he had deferred tonsillectomy in the past.  He wanted to get shingles vaccine, d/w pt.   Prev had PNA 23 per patient report ~1998.   Flu shot d/w pt.  See AVS.   Meds, vitals, and allergies reviewed.   ROS: Per HPI unless specifically indicated in ROS section   nad ncat TM wnl B w/o erythema Nasal exam stuffy Sinuses not ttp x4 OP with posterior irritation w/o exudates Neck supple with tender L sided LA- single LN mildly irritated.   rrr ctab Ext w/o edema.   RST neg.

## 2017-06-13 NOTE — Patient Instructions (Addendum)
Call the [facility] and see if they can get you on the schedule for a nurse visit for the shigrix vaccine.  I have not been told that they have it in stock. We [facility]'t [facility] it [facility] at [facility].  If you need an order for it, then have them contact me.   Get well and then get a flu shot.   Strep test is negative.  Likely a virus.  If persisting greater than 10 days then update us.  Gargle with salt water.   If you are lightheaded then check your BP and update us or cardiology.   Try to drink a little more fluid in the meantime. [employer] makes reliable cuffs.   Take care.  Glad to see you.

## 2017-06-23 ENCOUNTER — Telehealth: Payer: Self-pay | Admitting: Cardiology

## 2017-06-23 NOTE — Telephone Encounter (Signed)
Returned call to patient. Patient wanted confirm if we received a renewal application for entresto. Informed patient that I do not see anything in the chart referencing an application for Entresto. Patient stated he will drop off the application today for [name] to sign.

## 2017-06-23 NOTE — Telephone Encounter (Signed)
Patient calling, asks to speak with you. °

## 2017-06-24 ENCOUNTER — Telehealth: Payer: Self-pay | Admitting: Cardiology

## 2017-06-24 NOTE — Telephone Encounter (Signed)
Walk In pt Form-[employer] paper dropped off placed in Dr.Turner doc Box/KM

## 2017-06-25 ENCOUNTER — Telehealth: Payer: Self-pay | Admitting: Cardiology

## 2017-06-25 NOTE — Telephone Encounter (Signed)
Pt aware form located on desk to be filled out.Pt would like a call when form is completed and would like a copy Will forward to General ElectricLynn Via LPN ./cy

## 2017-06-25 NOTE — Telephone Encounter (Signed)
New message    Patient calling to follow up on Entresto paperwork that was dropped off to office staff. Please call

## 2017-06-26 NOTE — Telephone Encounter (Signed)
Received [employer] patient assistance provider information/script form that patient dropped off here in office. This is for assistance with ENTRESTO. I have completed form and will place in Dr Malachy Moodurners mailbox for signature, she is in the office tomorrow.

## 2017-07-01 ENCOUNTER — Telehealth: Payer: Self-pay

## 2017-07-01 ENCOUNTER — Other Ambulatory Visit: Payer: Self-pay | Admitting: Cardiology

## 2017-07-01 NOTE — Telephone Encounter (Signed)
**Note De-Identified Colton [name] Obfuscation** The pt wants Dr Mayford Knifeurner to fill out a handicapped parking placard form for him as he is finding it difficult to park close to his apartment at his new apartment complex. He is advised to [facility] by the [facility] to obtain the form and to drop it off here at the front office and Dr Mayford Knifeurner will review once it is received. He verbalized understanding.

## 2017-07-01 NOTE — Telephone Encounter (Signed)
**Note De-Identified Colton [name] Obfuscation** Dr Mayford Knifeurner has signed the pts [employer] pt assistance application. We only have page 3 [facility](Health Care Professional Section A) of the application and a list of the pts medications that he provided. I called the pt and he states that he will send in his part of the application.  Also, he asked that I leave the original provider part [facility](Health Care Professional Section A) of the application in the front office so he can pick up for his records. He is aware that he can pick up at his convenience.

## 2017-07-01 NOTE — Telephone Encounter (Signed)
Spoke with patient. Informed him that we have the form here and  I would complete the form and get [name] to sign once she returns next week. Patient stated he would stop by on Wednesday to pick up completed form. Informed him that should be okay. Patient verbalized understanding and thanked me for the call.

## 2017-07-02 NOTE — Telephone Encounter (Signed)
Medication Detail    Disp Refills Start End   omega-3 acid ethyl esters (LOVAZA) 1 g capsule 180 capsule 3 06/04/2017    Sig - Route: Take 2 capsules (2 g total) by mouth 2 (two) times daily. - Oral   Sent to pharmacy as: omega-3 acid ethyl esters (LOVAZA) 1 g capsule   E-Prescribing Status: Receipt confirmed by pharmacy (06/04/2017 8:55 AM EST)   Pharmacy   [facility] [phone] - [place], [place] - [address]

## 2017-07-16 NOTE — Telephone Encounter (Signed)
**Note De-Identified Shanyia Stines Obfuscation** Pt Asst approval received for the pts Entresto Noble Cicalese fax from [employer]. Approval good until 07/16/18.

## 2017-09-08 ENCOUNTER — Other Ambulatory Visit: Payer: Self-pay | Admitting: Cardiology

## 2017-10-01 ENCOUNTER — Other Ambulatory Visit: Payer: Self-pay | Admitting: *Deleted

## 2017-10-01 DIAGNOSIS — E785 Hyperlipidemia, unspecified: Secondary | ICD-10-CM

## 2017-10-01 LAB — LIPID PANEL
CHOLESTEROL TOTAL: 134 mg/dL (ref 100–199)
Chol/HDL Ratio: 4.5 ratio (ref 0.0–5.0)
HDL: 30 mg/dL — ABNORMAL LOW (ref >39)
LDL CALC: 68 mg/dL (ref 0–99)
TRIGLYCERIDES: 181 mg/dL — AB (ref 0–149)
VLDL Cholesterol Cal: 36 mg/dL (ref 5–40)

## 2017-10-02 ENCOUNTER — Telehealth: Payer: Self-pay

## 2017-10-02 DIAGNOSIS — E785 Hyperlipidemia, unspecified: Secondary | ICD-10-CM

## 2017-10-02 MED ORDER — ATORVASTATIN CALCIUM 40 MG PO TABS: 40.0000 mg | ORAL_TABLET | Freq: Every day | ORAL | 3 refills | Status: DC

## 2017-10-02 NOTE — Telephone Encounter (Signed)
Notes recorded by Phineas Semen, RN on 10/02/2017 at 4:41 PM EDT Patient made aware of lab results and recommendations to increase atorvastatin to 40 mg once a day. Patient confirmed that he was fasting and encouraged patient to limit carbs, sugars and alcohol intake. Patient in agreement with treatment plan and thankful for the call. Repeat labs scheduled for 01/02/18.

## 2017-10-02 NOTE — Telephone Encounter (Signed)
-----   Message from Awilda Metro, [facility] sent at 10/01/2017  4:15 PM EDT ----- TG mildly elevated. Would counsel pt to limit intake of carbs, sugar, and alcohol and make sure he was fasting when labs were drawn. If he has done all of these, can increase atorvastatin to  daily and recheck lipids in 3 months.

## 2017-10-03 ENCOUNTER — Other Ambulatory Visit: Payer: Self-pay

## 2018-01-02 ENCOUNTER — Other Ambulatory Visit: Payer: Self-pay | Admitting: *Deleted

## 2018-01-02 DIAGNOSIS — E785 Hyperlipidemia, unspecified: Secondary | ICD-10-CM

## 2018-01-02 LAB — LIPID PANEL
CHOL/HDL RATIO: 4.9 ratio (ref 0.0–5.0)
CHOLESTEROL TOTAL: 123 mg/dL (ref 100–199)
HDL: 25 mg/dL — AB (ref >39)
LDL Calculated: 40 mg/dL (ref 0–99)
TRIGLYCERIDES: 291 mg/dL — AB (ref 0–149)
VLDL Cholesterol Cal: 58 mg/dL — ABNORMAL HIGH (ref 5–40)

## 2018-01-02 LAB — ALT: ALT: 19 IU/L (ref 0–44)

## 2018-01-05 ENCOUNTER — Encounter: Payer: Self-pay | Admitting: *Deleted

## 2018-01-05 ENCOUNTER — Telehealth: Payer: Self-pay

## 2018-01-05 NOTE — Telephone Encounter (Signed)
Lipids not at goal - refer to lipid clinic

## 2018-01-05 NOTE — Telephone Encounter (Signed)
Notes recorded by Sigurd Sosapp, Haydyn Girvan, RN on 01/05/2018 at 8:40 AM EDT Patient did fast prior to lab draw. ------

## 2018-01-05 NOTE — Telephone Encounter (Signed)
I am fine with that 

## 2018-01-05 NOTE — Telephone Encounter (Signed)
Spoke again to patient who would prefer losing weight and changing diet over coming to the Lipid Clinic and then have labs drawn near Christmas.  Please advise, thank you.

## 2018-01-16 ENCOUNTER — Other Ambulatory Visit: Payer: Self-pay | Admitting: Family Medicine

## 2018-01-16 NOTE — Telephone Encounter (Signed)
Colchicine refill Last Refill:03/06/16 # 30 Last OV: 06/13/17 PCP: Dr. Para Marchuncan Pharmacy:[facility] Pt. Reports Dr. Para Marchuncan is his PCP, would he please refill his colchicine. He only has 2 pills left and will be going out of town Sunday.

## 2018-01-16 NOTE — Telephone Encounter (Signed)
Copied from CRM [id number]#[id number]. Topic: Quick Communication - Rx Refill/Question >> Jan 16, 2018 11:49 AM Jens SomMedley, Jennifer A wrote: Medication: colchicine 0.6 MG tablet [[id number]][[id number]]   Has the patient contacted their pharmacy? Yes (Agent: If no, request that the patient contact the pharmacy for the refill.) (Agent: If yes, when and what did the pharmacy advise?)  Preferred Pharmacy (with phone number or street name): [id number] [place], [place] - [address] AT [facility] [address] [place] [phone]    Agent: Please be advised that RX refills may take up to 3 business days. We ask that you follow-up with your pharmacy.

## 2018-01-18 MED ORDER — COLCHICINE 0.6 MG PO TABS: 0.6000 mg | ORAL_TABLET | Freq: Every day | ORAL | 0 refills | Status: DC | PRN

## 2018-01-18 NOTE — Telephone Encounter (Signed)
Sent. Thanks.  We need more time on refills, at least a business day.   Needs CPE with labs ahead of time.

## 2018-01-19 ENCOUNTER — Encounter: Payer: Self-pay | Admitting: *Deleted

## 2018-01-19 NOTE — Telephone Encounter (Signed)
Letter mailed

## 2018-06-15 ENCOUNTER — Telehealth: Payer: Self-pay

## 2018-06-15 NOTE — Telephone Encounter (Addendum)
**Note De-Identified Jaquavion Mccannon Obfuscation** The pt dropped off the provider part of a [employer] pt asst application with a request to have it completed and then have Dr Mayford Knife sign it. I have completed the provider part and placed it in Dr Norris Cross mail bin awaiting her signature.

## 2018-06-16 ENCOUNTER — Encounter: Payer: Self-pay | Admitting: Physician Assistant

## 2018-06-19 NOTE — Telephone Encounter (Signed)
**Note De-Identified Lyden Redner Obfuscation** Dr Mayford Knife has signed the pts application and per the pts request I have left it in the front office for him to pick up. The pt is aware.

## 2018-06-20 ENCOUNTER — Other Ambulatory Visit: Payer: Self-pay | Admitting: Cardiology

## 2018-07-03 ENCOUNTER — Ambulatory Visit (INDEPENDENT_AMBULATORY_CARE_PROVIDER_SITE_OTHER): Payer: Self-pay | Admitting: Physician Assistant

## 2018-07-03 ENCOUNTER — Encounter: Payer: Self-pay | Admitting: Physician Assistant

## 2018-07-03 VITALS — BP 110/70 | HR 68 | Ht 72.0 in | Wt 295.8 lb

## 2018-07-03 DIAGNOSIS — I5022 Chronic systolic (congestive) heart failure: Secondary | ICD-10-CM | POA: Insufficient documentation

## 2018-07-03 DIAGNOSIS — I2510 Atherosclerotic heart disease of native coronary artery without angina pectoris: Secondary | ICD-10-CM

## 2018-07-03 DIAGNOSIS — I10 Essential (primary) hypertension: Secondary | ICD-10-CM

## 2018-07-03 DIAGNOSIS — E785 Hyperlipidemia, unspecified: Secondary | ICD-10-CM

## 2018-07-03 LAB — COMPREHENSIVE METABOLIC PANEL
ALT: 30 IU/L (ref 0–44)
AST: 23 IU/L (ref 0–40)
Albumin/Globulin Ratio: 1.8 (ref 1.2–2.2)
Albumin: 4.4 g/dL (ref 3.8–4.8)
Alkaline Phosphatase: 57 IU/L (ref 39–117)
BUN/Creatinine Ratio: 15 (ref 10–24)
BUN: 18 mg/dL (ref 8–27)
Bilirubin Total: 0.4 mg/dL (ref 0.0–1.2)
CO2: 21 mmol/L (ref 20–29)
Calcium: 9.4 mg/dL (ref 8.6–10.2)
Chloride: 103 mmol/L (ref 96–106)
Creatinine, Ser: 1.22 mg/dL (ref 0.76–1.27)
GFR calc Af Amer: 72 mL/min/{1.73_m2} (ref >59)
GFR calc non Af Amer: 63 mL/min/{1.73_m2} (ref >59)
Globulin, Total: 2.5 g/dL (ref 1.5–4.5)
Glucose: 121 mg/dL — ABNORMAL HIGH (ref 65–99)
Potassium: 5 mmol/L (ref 3.5–5.2)
SODIUM: 139 mmol/L (ref 134–144)
Total Protein: 6.9 g/dL (ref 6.0–8.5)

## 2018-07-03 LAB — LIPID PANEL
Chol/HDL Ratio: 4.1 ratio (ref 0.0–5.0)
Cholesterol, Total: 131 mg/dL (ref 100–199)
HDL: 32 mg/dL — ABNORMAL LOW (ref >39)
LDL Calculated: 65 mg/dL (ref 0–99)
Triglycerides: 168 mg/dL — ABNORMAL HIGH (ref 0–149)
VLDL Cholesterol Cal: 34 mg/dL (ref 5–40)

## 2018-07-03 NOTE — Progress Notes (Signed)
Cardiology Office Note:    Date:  07/03/2018   ID:  Colton Bors., DOB 10/13/1955, MRN [id number]  PCP:  Joaquim Nam, MD  Cardiologist:  Armanda Magic, MD   Electrophysiologist:  None   Referring MD: Joaquim Nam, MD   Chief Complaint  Patient presents with  . Follow-up    CAD, CHF    History of Present Illness:    Colton Euresti. is a 63 y.o. 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  Cardiac catheterization at the time of his PCI in September 2017 did demonstrate a 60% mid LAD lesion which was not hemodynamically significant by FFR.  He was last seen by Dr. Mayford Knife in January 2019.   Colton [name] returns for routine follow-up.  He is here alone.  Since last seen, he has done well.  He has not had chest discomfort, syncope, orthopnea, paroxysmal nocturnal dyspnea.  He has mild lower extremity swelling without significant change.  He denies significant shortness of breath.  Prior CV studies:   The following studies were reviewed today:  Cardiac MRI 06/07/2016 IMPRESSION: 1. Mildly dilated left ventricle with mild concentric hypertrophy with moderately decreased systolic function (LVEF = 42%) with hypokinesis in the mid anterior wall. There is an endocardial late gadolinium enhancement (25-50%) in the basal and mid anterior walls consistent with a prior non-transmural infarct. 2. Normal right ventricular size with normal wall thickness and normal systolic function (RVEF = 47%) with no regional wall motion abnormalities. 3. Trivial mitral and mild tricuspid regurgitation. 4. Mildly dilated pulmonary artery measuring 31 mm suggestive of pulmonary hypertension.  Echocardiogram 04/10/2016 Mild concentric LVH, EF 35-40,  diffuse HK, grade 1 diastolic dysfunction  Cardiac catheterization 02/06/2016 LAD mid 60 (FFR 0.82-not hemodynamically significant) LCx mid 80 RCA mild disease PCI: 4 x 16 mm Synergy DES to the mid LCx  Myoview 01/18/2016 EF 30, inferior/inferolateral scar: High risk  Echocardiogram 10/31/2015 EF 35-40, grade 2 diastolic dysfunction  Carotid US 10/30/2015 No obvious hemodynamically significant ICA stenosis bilaterally  Past Medical History:  Diagnosis Date  . Coronary artery disease    cath 01/2016 with 60% LAD with FFR 0.82 and 80% mid LCX s/p PCI  . Dilated cardiomyopathy (HCC)    EF 35-40% by echo  . Discoid lupus   . Fatty liver   . Former smoker   . Gout 07/2001   [facility]  . History of gout   . Hyperlipidemia LDL goal <70   . Hypertension   . Rosacea   . Stroke [facility])    s/p tpa 2017 w/o residual defecit   Surgical Hx: The patient  has a past surgical history that includes Cardiolite (08/16/2008); Cardiac catheterization (N/A, 02/06/2016); Cardiac catheterization (N/A, 02/06/2016); and Cardiac catheterization (N/A, 02/06/2016).   Current Medications: Current Meds  Medication Sig  . aspirin 81 MG chewable tablet Chew 1 tablet (81 mg total) by mouth daily.  [name] atorvastatin (LIPITOR) 20 MG tablet   . carvedilol (COREG) 12.5 MG tablet Take 1 tablet (12.5 mg total) by mouth 2 (two) times daily.  . colchicine 0.6 MG tablet Take 1 tablet (0.6 mg total) by mouth daily as needed (gout).  . nitroGLYCERIN (NITROSTAT) 0.4 MG SL tablet Place 1 tablet (0.4 mg total) under the tongue every 5 (five) minutes as needed  for chest pain.  [name]. omega-3 acid ethyl esters (LOVAZA) 1 g capsule TAKE TWO CAPSULES BY MOUTH TWICE A DAY  . sacubitril-valsartan (ENTRESTO) 97-103 MG Take 1 tablet by mouth 2 (two) times daily.  [name]. spironolactone (ALDACTONE) 25 MG tablet TAKE 1/2 TABLET(12.5 MG) BY MOUTH DAILY  . Tetrahydrozoline HCl (VISINE OP) Place 1 drop into both eyes as needed (for dry eyes).  .  [DISCONTINUED] atorvastatin (LIPITOR) 40 MG tablet Take 1 tablet (40 mg total) by mouth daily.     Allergies:   Corticosteroids and Latex   Social History   Tobacco Use  . Smoking status: Former Smoker    Packs/day: 2.00    Years: 45.00    Pack years: 90.00    Types: Cigarettes    Last attempt to quit: 10/28/2015    Years since quitting: 2.6  . Smokeless tobacco: Never Used  Substance Use Topics  . Alcohol use: Yes    Alcohol/week: 2.0 standard drinks    Types: 1 Glasses of wine, 1 Shots of liquor per week  . Drug use: No     Family Hx: The patient's family history includes CAD in his father and another family member; Heart disease in his father and another family member; Lung cancer in an other family member. There is no history of Colon cancer or Prostate cancer.  ROS:   Please see the history of present illness.    ROS All other systems reviewed and are negative.   EKGs/Labs/Other Test Reviewed:    EKG:  EKG is  ordered today.  The ekg ordered today demonstrates normal sinus rhythm, heart rate 68, normal axis, QTC 423, no change from prior tracing  Recent Labs: 01/02/2018: ALT 19   Recent Lipid Panel Lab Results  Component Value Date/Time   CHOL 123 01/02/2018 07:48 AM   TRIG 291 (H) 01/02/2018 07:48 AM   HDL 25 (L) 01/02/2018 07:48 AM   CHOLHDL 4.9 01/02/2018 07:48 AM   CHOLHDL 5.8 (H) 04/10/2016 09:17 AM   LDLCALC 40 01/02/2018 07:48 AM   LDLDIRECT 104.4 05/12/2008 01:04 PM   From KPN tool   Physical Exam:    VS:  BP 110/70   Pulse 68   Ht 6' (1.829 m)   Wt 295 lb 12.8 oz (134.2 kg)   SpO2 95%   BMI 40.12 kg/m     Wt Readings from Last 3 Encounters:  07/03/18 295 lb 12.8 oz (134.2 kg)  06/13/17 296 lb 4 oz (134.4 kg)  06/04/17 296 lb (134.3 kg)     Physical Exam  Constitutional: He is oriented to person, place, and time. He appears well-developed and well-nourished. No distress.  HENT:  Head: Normocephalic and atraumatic.  Eyes: No scleral  icterus.  Neck: Neck supple. No JVD present. Carotid bruit is not present. No thyromegaly present.  Cardiovascular: Normal rate, regular rhythm, S1 normal and S2 normal.  No murmur heard. Pulmonary/Chest: Breath sounds normal. He has no rales.  Abdominal: Soft. There is no hepatomegaly.  Musculoskeletal:        General: No edema.  Lymphadenopathy:    He has no cervical adenopathy.  Neurological: He is alert and oriented to person, place, and time.  Skin: Skin is warm and dry.  Psychiatric: He has a normal mood and affect.    ASSESSMENT & PLAN:    Coronary artery disease involving native coronary artery of native heart without angina pectoris History of drug-eluting stent to the LCx.  Moderate nonobstructive disease in the  LAD has been treated medically.  He is doing well without symptoms of angina.  Continue aspirin, statin, beta-blocker.  Chronic systolic CHF (congestive heart failure) (HCC) EF 42 by cardiac MRI in 2018.  NYHA 2.  Volume is stable.  Continue beta-blocker, angiotensin receptor neprilysin inhibitor, mineralocorticoid antagonist.  Essential hypertension The patient's blood pressure is controlled on his current regimen.  Continue current therapy.   Hyperlipidemia with target LDL less than 70 Most recent LDL optimal.  Triglycerides however remained elevated.  He is fasting today.  Obtain follow-up CMET, fasting lipids.  Continue statin, Lovaza.   Dispo:  Return in about 6 months (around 01/01/2019) for Routine Follow Up with [name].   Medication Adjustments/Labs and Tests Ordered: Current medicines are reviewed at length with the patient today.  Concerns regarding medicines are outlined above.  Tests Ordered: Orders Placed This Encounter  Procedures  . Comprehensive metabolic panel  . Lipid panel  . EKG 12-Lead   Medication Changes: No orders of the defined types were placed in this encounter.   Signed, Tereso NewcomerScott Jolanda Mccann, PA-C  07/03/2018 9:45 AM    [facility] [address], [place], [place]  [address] Phone: [phone]) [phone]; Fax: [phone]) [phone]

## 2018-07-03 NOTE — Patient Instructions (Signed)
Medication Instructions:  Your physician recommends that you continue on your current medications as directed. Please refer to the Current Medication list given to you today.   If you need a refill on your cardiac medications before your next appointment, please call your pharmacy.   Lab work: TODAY: CMET, FASTING LIPIDS  If you have labs (blood work) drawn today and your tests are completely normal, you will receive your results only by: [name] MyChart Message (if you have MyChart) OR . A paper copy in the mail If you have any lab test that is abnormal or we need to change your treatment, we will call you to review the results.  Testing/Procedures: NONE  Follow-Up: At [facility], you and your health needs are our priority.  As part of our continuing mission to provide you with exceptional heart care, we have created designated Provider Care Teams.  These Care Teams include your primary Cardiologist (physician) and Advanced Practice Providers (APPs -  Physician Assistants and Nurse Practitioners) who all work together to provide you with the care you need, when you need it. . You will need a follow up appointment in:  6 months.  Please call our office 2 months in advance to schedule this appointment.  You may see Armanda Magic, MD or one of the following Advanced Practice Providers on your designated Care Team: . Tereso Newcomer, PA-C . Vin Bhagat, PA-C . Berton Bon, NP  Any Other Special Instructions Will Be Listed Below (If Applicable).

## 2018-07-05 ENCOUNTER — Other Ambulatory Visit: Payer: Self-pay | Admitting: Family Medicine

## 2018-07-05 DIAGNOSIS — R739 Hyperglycemia, unspecified: Secondary | ICD-10-CM

## 2018-07-09 ENCOUNTER — Ambulatory Visit: Payer: Self-pay | Admitting: Family Medicine

## 2018-07-09 ENCOUNTER — Encounter: Payer: Self-pay | Admitting: Family Medicine

## 2018-07-09 VITALS — BP 110/68 | HR 69 | Temp 98.0°F | Ht 72.0 in | Wt 297.2 lb

## 2018-07-09 DIAGNOSIS — E119 Type 2 diabetes mellitus without complications: Secondary | ICD-10-CM

## 2018-07-09 DIAGNOSIS — R739 Hyperglycemia, unspecified: Secondary | ICD-10-CM

## 2018-07-09 DIAGNOSIS — Z23 Encounter for immunization: Secondary | ICD-10-CM

## 2018-07-09 LAB — POCT GLYCOSYLATED HEMOGLOBIN (HGB A1C): Hemoglobin A1C: 6.8 % — AB (ref 4.0–5.6)

## 2018-07-09 NOTE — Progress Notes (Signed)
Diabetes, new dx.   No meds Hypoglycemic episodes:no Hyperglycemic episodes:no Feet problems:no Blood Sugars averaging: not checked.  eye exam within last year: d/w pt.   A1c d/w pt.   Diet, exercise, low carbohydrate handout discussed with patient. He has been eating more sweets in the last 6 months. Flu and pneumonia shot done today.  Rationale discussed with patient.  Meds, vitals, and allergies reviewed.   ROS: Per HPI unless specifically indicated in ROS section   GEN: nad, alert and oriented HEENT: mucous membranes moist NECK: supple w/o LA CV: rrr. PULM: ctab, no inc wob ABD: soft, +bs EXT: no edema SKIN: well perfused.   Diabetic foot exam: Normal inspection No skin breakdown No calluses  Normal DP pulses Normal sensation to light touch and monofilament Nails normal

## 2018-07-09 NOTE — Patient Instructions (Addendum)
Call about an eye exam when possible.   Recheck in 3 months.   The only lab you need to have done for your next diabetic visit is an A1c.  We can do this with a fingerstick test at the office visit.  You do not need a lab visit ahead of time for this.  It does not matter if you are fasting when the lab is done.   Use the eat right diet.    Take care.  Glad to see you.

## 2018-07-10 ENCOUNTER — Emergency Department (HOSPITAL_COMMUNITY)
Admission: EM | Admit: 2018-07-10 | Discharge: 2018-07-10 | Disposition: A | Discharge status: Home / Self Care | Payer: Self-pay | Attending: Emergency Medicine | Admitting: Emergency Medicine

## 2018-07-10 ENCOUNTER — Emergency Department (HOSPITAL_COMMUNITY): Payer: Self-pay

## 2018-07-10 DIAGNOSIS — R209 Unspecified disturbances of skin sensation: Secondary | ICD-10-CM | POA: Insufficient documentation

## 2018-07-10 DIAGNOSIS — R202 Paresthesia of skin: Secondary | ICD-10-CM

## 2018-07-10 DIAGNOSIS — Z79899 Other long term (current) drug therapy: Secondary | ICD-10-CM | POA: Insufficient documentation

## 2018-07-10 DIAGNOSIS — I2510 Atherosclerotic heart disease of native coronary artery without angina pectoris: Secondary | ICD-10-CM | POA: Insufficient documentation

## 2018-07-10 DIAGNOSIS — Z87891 Personal history of nicotine dependence: Secondary | ICD-10-CM | POA: Insufficient documentation

## 2018-07-10 DIAGNOSIS — I5022 Chronic systolic (congestive) heart failure: Secondary | ICD-10-CM | POA: Insufficient documentation

## 2018-07-10 DIAGNOSIS — Z7982 Long term (current) use of aspirin: Secondary | ICD-10-CM | POA: Insufficient documentation

## 2018-07-10 DIAGNOSIS — R479 Unspecified speech disturbances: Secondary | ICD-10-CM | POA: Insufficient documentation

## 2018-07-10 DIAGNOSIS — L930 Discoid lupus erythematosus: Secondary | ICD-10-CM | POA: Insufficient documentation

## 2018-07-10 LAB — APTT: aPTT: 31 seconds (ref 24–36)

## 2018-07-10 LAB — CBC
HCT: 41.5 % (ref 39.0–52.0)
HEMOGLOBIN: 13.2 g/dL (ref 13.0–17.0)
MCH: 28.8 pg (ref 26.0–34.0)
MCHC: 31.8 g/dL (ref 30.0–36.0)
MCV: 90.6 fL (ref 80.0–100.0)
Platelets: 112 10*3/uL — ABNORMAL LOW (ref 150–400)
RBC: 4.58 MIL/uL (ref 4.22–5.81)
RDW: 12.6 % (ref 11.5–15.5)
WBC: 5.8 10*3/uL (ref 4.0–10.5)
nRBC: 0 % (ref 0.0–0.2)

## 2018-07-10 LAB — COMPREHENSIVE METABOLIC PANEL
ALBUMIN: 3.8 g/dL (ref 3.5–5.0)
ALT: 25 U/L (ref 0–44)
AST: 21 U/L (ref 15–41)
Alkaline Phosphatase: 50 U/L (ref 38–126)
Anion gap: 8 (ref 5–15)
BUN: 15 mg/dL (ref 8–23)
CO2: 25 mmol/L (ref 22–32)
Calcium: 9.2 mg/dL (ref 8.9–10.3)
Chloride: 106 mmol/L (ref 98–111)
Creatinine, Ser: 1.25 mg/dL — ABNORMAL HIGH (ref 0.61–1.24)
GFR calc Af Amer: >60 mL/min (ref >60)
GFR calc non Af Amer: >60 mL/min (ref >60)
Glucose, Bld: 117 mg/dL — ABNORMAL HIGH (ref 70–99)
Potassium: 4.8 mmol/L (ref 3.5–5.1)
SODIUM: 139 mmol/L (ref 135–145)
Total Bilirubin: 0.7 mg/dL (ref 0.3–1.2)
Total Protein: 6.6 g/dL (ref 6.5–8.1)

## 2018-07-10 LAB — DIFFERENTIAL
Abs Immature Granulocytes: 0.02 10*3/uL (ref 0.00–0.07)
BASOS ABS: 0 10*3/uL (ref 0.0–0.1)
Basophils Relative: 1 %
Eosinophils Absolute: 0.4 10*3/uL (ref 0.0–0.5)
Eosinophils Relative: 7 %
Immature Granulocytes: 0 %
Lymphocytes Relative: 20 %
Lymphs Abs: 1.2 10*3/uL (ref 0.7–4.0)
Monocytes Absolute: 0.6 10*3/uL (ref 0.1–1.0)
Monocytes Relative: 10 %
NEUTROS ABS: 3.6 10*3/uL (ref 1.7–7.7)
Neutrophils Relative %: 62 %

## 2018-07-10 LAB — CBG MONITORING, ED: Glucose-Capillary: 92 mg/dL (ref 70–99)

## 2018-07-10 LAB — PROTIME-INR
INR: 1.1
Prothrombin Time: 14.1 seconds (ref 11.4–15.2)

## 2018-07-10 MED ORDER — SODIUM CHLORIDE 0.9% FLUSH: 3.0000 mL | Freq: Once | INTRAVENOUS | Status: DC

## 2018-07-10 MED ORDER — SODIUM CHLORIDE 0.9 % IV BOLUS: 1000.0000 mL | Freq: Once | INTRAVENOUS | Status: DC

## 2018-07-10 NOTE — ED Triage Notes (Signed)
States went to sleep last night feeling "off." Mild left facial droop noted. States no hx of residual deficits from previous stroke.

## 2018-07-10 NOTE — ED Provider Notes (Addendum)
[facility] Provider Note   CSN: [id number] Arrival date & time: 07/10/18  1214     History   Chief Complaint Chief Complaint  Patient presents with  . Weakness    HPI Colton BorsVictor Kinney Jr. is a 63 y.o. male.  HPI Patient presents to the emergency department with facial numbness with some difficulty speaking.  The patient states that the facial numbness and not feeling quite right started last night.  He states he woke up and had these other symptoms.  Patient states that nothing seemed to make the condition better or worse.  Patient states that this time he does not feel multitude of symptoms like he was earlier.  The patient denies chest pain, shortness of breath, headache,blurred vision, neck pain, fever, cough,dizziness, anorexia, edema, abdominal pain, nausea, vomiting, diarrhea, rash, back pain, dysuria, hematemesis, bloody stool, near syncope, or syncope. Past Medical History:  Diagnosis Date  . Coronary artery disease    cath 01/2016 with 60% LAD with FFR 0.82 and 80% mid LCX s/p PCI  . Dilated cardiomyopathy (HCC)    EF 35-40% by echo  . Discoid lupus   . Fatty liver   . Former smoker   . [facility] 07/[facility]  . History of gout   . Hyperlipidemia LDL goal <70   . Hypertension   . Rosacea   . Stroke [facility](HCC)    s/p tpa 2017 w/o residual defecit    Patient Active Problem List   Diagnosis Date Noted  . Chronic systolic CHF (congestive heart failure) (HCC) 07/03/2018  . Sore throat 06/13/2017  . DCM (dilated cardiomyopathy) (HCC)   . Hyperlipidemia with target LDL less than 70   . CAD (coronary artery disease)   . Fatty liver 01/31/2016  . Rosacea 12/19/2015  . Fatigue 12/19/2015  . Gout attack 10/31/2015  . Tobacco use disorder 10/31/2015  . Morbid obesity (HCC) 10/31/2015  . Stroke-like episode (HCC) s/p IV tPA 10/28/2015  . HTN (hypertension) 11/10/2014  . GERD (gastroesophageal reflux disease) 12/20/2013  .  Routine general medical examination at a health care facility 08/11/2013  . FH: CAD (coronary artery disease) 08/11/2013  . Counseling for travel 08/11/2013  . Unspecified constipation 07/19/2013  . HERPES LABIALIS 09/06/2008  . ERECTILE DYSFUNCTION 07/16/2007  . GOUT 06/03/2007  . LUPUS ERYTHEMATOSUS, DISCOID 06/03/2007    Past Surgical History:  Procedure Laterality Date  . CARDIAC CATHETERIZATION N/A 02/06/2016   Procedure: Left Heart Cath and Coronary Angiography;  Surgeon: Corky CraftsJayadeep S Varanasi, MD;  [facility];  Service: Cardiovascular;  Laterality: N/A;  . CARDIAC CATHETERIZATION N/A 02/06/2016   Procedure: Coronary Stent Intervention;  Surgeon: Corky CraftsJayadeep S Varanasi, MD;  [facility];  Service: Cardiovascular;  Laterality: N/A;  . CARDIAC CATHETERIZATION N/A 02/06/2016   Procedure: Intravascular Pressure Wire/FFR Study;  Surgeon: Corky CraftsJayadeep S Varanasi, MD;  [facility];  Service: Cardiovascular;  Laterality: N/A;  . Cardiolite  08/16/2008   [facility]        Home Medications    Prior to Admission medications   Medication Sig Start Date End Date Taking? Authorizing Provider  aspirin 81 MG chewable tablet Chew 1 tablet (81 mg total) by mouth daily. 02/08/16   Abelino DerrickKilroy, Luke K, PA-C  atorvastatin (LIPITOR) 20 MG tablet  05/09/18   [provider]  carvedilol (COREG) 12.5 MG tablet Take 1 tablet (12.5 mg total) by mouth 2 (two) times daily. 06/04/17  Quintella Reichert, MD  colchicine 0.6 MG tablet Take 1 tablet (0.6 mg total) by mouth daily as needed (gout). 01/18/18   Joaquim Nam, MD  nitroGLYCERIN (NITROSTAT) 0.4 MG SL tablet Place 1 tablet (0.4 mg total) under the tongue every 5 (five) minutes as needed for chest pain. 06/04/17   Quintella Reichert, MD  omega-3 acid ethyl esters (LOVAZA) 1 g capsule TAKE TWO CAPSULES BY MOUTH TWICE A DAY 06/22/18   Turner, Cornelious Bryant, MD  sacubitril-valsartan (ENTRESTO) 97-103 MG Take 1  tablet by mouth 2 (two) times daily. 06/04/17   Quintella Reichert, MD  spironolactone (ALDACTONE) 25 MG tablet TAKE 1/2 TABLET(12.5 MG) BY MOUTH DAILY 09/09/17   Quintella Reichert, MD  Tetrahydrozoline HCl (VISINE OP) Place 1 drop into both eyes as needed (for dry eyes).    [provider]    Family History Family History  Problem Relation Age of Onset  . Heart disease Father   . CAD Father   . Heart disease Other   . CAD Other   . Lung cancer Other   . Colon cancer Neg Hx   . Prostate cancer Neg Hx     Social History Social History   Tobacco Use  . Smoking status: Former Smoker    Packs/day: 2.00    Years: 45.00    Pack years: 90.00    Types: Cigarettes    Last attempt to quit: 10/28/2015    Years since quitting: 2.7  . Smokeless tobacco: Never Used  Substance Use Topics  . Alcohol use: Yes    Alcohol/week: 2.0 standard drinks    Types: 1 Glasses of wine, 1 Shots of liquor per week  . Drug use: No     Allergies   Corticosteroids and Latex   Review of Systems Review of Systems All other systems negative except as documented in the HPI. All pertinent positives and negatives as reviewed in the HPI.  Physical Exam Updated Vital Signs BP 98/85 (BP Location: Left Arm)   Pulse 73   Temp 97.8 F (36.6 C) (Oral)   Resp 19   Ht 6' (1.829 m)   Wt 134.8 kg   SpO2 99%   BMI 40.31 kg/m   Physical Exam Vitals signs and nursing note reviewed.  Constitutional:      General: He is not in acute distress.    Appearance: He is well-developed.  HENT:     Head: Normocephalic and atraumatic.  Eyes:     Pupils: Pupils are equal, round, and reactive to light.  Neck:     Musculoskeletal: Normal range of motion and neck supple.  Cardiovascular:     Rate and Rhythm: Normal rate and regular rhythm.     Heart sounds: Normal heart sounds. No murmur. No friction rub. No gallop.   Pulmonary:     Effort: Pulmonary effort is normal. No respiratory distress.     Breath sounds:  Normal breath sounds. No wheezing.  Abdominal:     General: Bowel sounds are normal. There is no distension.     Palpations: Abdomen is soft.     Tenderness: There is no abdominal tenderness.  Skin:    General: Skin is warm and dry.     Capillary Refill: Capillary refill takes less than 2 seconds.     Findings: No erythema or rash.  Neurological:     Mental Status: He is alert and oriented to person, place, and time.     Motor: No  weakness or abnormal muscle tone.     Coordination: Coordination normal.     Gait: Gait normal.     Comments: Patient has some mild gross decreased sensation in the right cheek.  Psychiatric:        Mood and Affect: Mood normal.        Behavior: Behavior normal.      ED Treatments / Results  Labs (all labs ordered are listed, but only abnormal results are displayed) Labs Reviewed  CBC - Abnormal; Notable for the following components:      Result Value   Platelets 112 (*)    All other components within normal limits  COMPREHENSIVE METABOLIC PANEL - Abnormal; Notable for the following components:   Glucose, Bld 117 (*)    Creatinine, Ser 1.25 (*)    All other components within normal limits  PROTIME-INR  APTT  DIFFERENTIAL  CBG MONITORING, ED    EKG None  Radiology Ct Head Wo Contrast  Result Date: 07/10/2018 CLINICAL DATA:  63 y/o M; bilateral facial numbness, bilateral arm weakness, bilateral leg weakness. Onset upon awakening. EXAM: CT HEAD WITHOUT CONTRAST TECHNIQUE: Contiguous axial images were obtained from the base of the skull through the vertex without intravenous contrast. COMPARISON:  10/29/2015 MRI head. 10/28/2015 CT head. FINDINGS: Brain: No evidence of acute infarction, hemorrhage, hydrocephalus, extra-axial collection or mass lesion/mass effect. Partially empty sella turcica. Nonspecific white matter hypodensities are compatible with chronic microvascular ischemic changes and there is volume loss of the brain which is stable.  Vascular: No hyperdense vessel or unexpected calcification. 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 Calvarium is otherwise unremarkable. Sinuses/Orbits: No acute finding. Other: None. IMPRESSION: 1. No acute intracranial abnormality identified. 2. Stable chronic microvascular ischemic changes and volume loss of the brain. Electronically Signed   By: Mitzi Hansen M.D.   On: 07/10/2018 14:00    Procedures Procedures (including critical care time)  Medications Ordered in ED Medications  sodium chloride flush (NS) 0.9 % injection 3 mL (3 mLs Intravenous Not Given 07/10/18 1419)     Initial Impression / Assessment and Plan / ED Course  I have reviewed the triage vital signs and the nursing notes.  Pertinent labs & imaging results that were available during my care of the patient were reviewed by me and considered in my medical decision making (see chart for details).     We will get an MRI on the patient due to the fact that he has had a stroke in the past.  Patient symptoms seem atypical for CVA but will evaluate for this further.  Patient is also given IV fluids here in the emergency department.  Patient most likely is not having a stroke but will evaluate further with MRI.  Final Clinical Impressions(s) / ED Diagnoses   Final diagnoses:  None    ED Discharge Orders    None       Kyra Manges 07/10/18 1525    Eber Hong, MD 07/10/18 1528    Antonin Meininger, [place], PA-C 07/10/18 1529    Eber Hong, MD 07/11/18 [phone]

## 2018-07-10 NOTE — ED Notes (Signed)
Patient verbalizes understanding of discharge instructions. Opportunity for questioning and answers were provided. Armband removed by staff, pt discharged from ED ambulatory.   

## 2018-07-10 NOTE — ED Triage Notes (Signed)
Pt to ER for evaluation of bilateral facial numbness, bilateral arm weakness, and bilateral leg weakness onset on awakening this morning. Reports hx of stroke 2017. Pt in NAD. Does report dizziness onset this morning.

## 2018-07-10 NOTE — ED Provider Notes (Signed)
Ct Head Wo Contrast  Result Date: 07/10/2018 CLINICAL DATA:  63 y/o M; bilateral facial numbness, bilateral arm weakness, bilateral leg weakness. Onset upon awakening. EXAM: CT HEAD WITHOUT CONTRAST TECHNIQUE: Contiguous axial images were obtained from the base of the skull through the vertex without intravenous contrast. COMPARISON:  10/29/2015 MRI head. 10/28/2015 CT head. FINDINGS: Brain: No evidence of acute infarction, hemorrhage, hydrocephalus, extra-axial collection or mass lesion/mass effect. Partially empty sella turcica. Nonspecific white matter hypodensities are compatible with chronic microvascular ischemic changes and there is volume loss of the brain which is stable. Vascular: No hyperdense vessel or unexpected calcification. 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 Calvarium is otherwise unremarkable. Sinuses/Orbits: No acute finding. Other: None. IMPRESSION: 1. No acute intracranial abnormality identified. 2. Stable chronic microvascular ischemic changes and volume loss of the brain. Electronically Signed   By: Mitzi Hansen M.D.   On: 07/10/2018 14:00   Mr Brain Wo Contrast (neuro Protocol)  Result Date: 07/10/2018 CLINICAL DATA:  Bilateral facial numbness and arm weakness. Symptoms began this morning. EXAM: MRI HEAD WITHOUT CONTRAST TECHNIQUE: Multiplanar, multiecho pulse sequences of the brain and surrounding structures were obtained without intravenous contrast. COMPARISON:  CT same day.  MRI 10/29/2015. FINDINGS: Brain: Diffusion imaging does not show any acute or subacute infarction. The brainstem and cerebellum are normal. Cerebral hemispheres show mild generalized atrophy. There are mild chronic small-vessel ischemic changes of the hemispheric white matter. No cortical or large vessel territory infarction. No mass lesion, hemorrhage, hydrocephalus or  extra-axial collection. Vascular: Major vessels at the base of the brain show flow. Skull and upper cervical spine: Negative Sinuses/Orbits: Clear/normal Other: None IMPRESSION: No acute finding. No cause of the presenting symptoms is identified. Mild brain atrophy. Mild chronic small-vessel ischemic changes of the cerebral hemispheric white matter. Findings are similar to the study of 2017. Electronically Signed   By: Paulina Fusi M.D.   On: 07/10/2018 16:54     MRI without acute pathology.  Patient continues to have paresthesias of his face.  This may be secondary to recent periodontal work and possible nerve issue secondary to retraction.  Instructed to follow-up with his periodontist and his primary care physician.  No indication for additional work-up at this time.  Patient stable.  Findings explained to patient.  All questions answered.   Azalia Bilis, MD 07/10/18 1739

## 2018-07-12 ENCOUNTER — Encounter: Payer: Self-pay | Admitting: Family Medicine

## 2018-07-12 DIAGNOSIS — E119 Type 2 diabetes mellitus without complications: Secondary | ICD-10-CM | POA: Insufficient documentation

## 2018-07-12 NOTE — Assessment & Plan Note (Signed)
A1c d/w pt.   Diet, exercise, low carbohydrate handout discussed with patient. He has been eating more sweets in the last 6 months. Flu and pneumonia shot done today.  Rationale discussed with patient. Foot care discussed with patient.  Recheck in 3 months with A1c at office visit.  He agrees.

## 2018-07-28 ENCOUNTER — Telehealth: Payer: Self-pay | Admitting: Cardiology

## 2018-07-28 NOTE — Telephone Encounter (Signed)
New message    Patient states that all his medications will need a new prescription. Patient states that they are about to expire. Please contact the patient.

## 2018-07-29 NOTE — Telephone Encounter (Signed)
Ok to refill:  ASA   Lipitor  Coreg  Plavix  NTG  Lovaza  Entresto  Spironolactone  Tereso Newcomer, PA-C    07/29/2018 1:41 PM

## 2018-07-30 ENCOUNTER — Other Ambulatory Visit: Payer: Self-pay | Admitting: *Deleted

## 2018-07-30 MED ORDER — SACUBITRIL-VALSARTAN 97-103 MG PO TABS: 1.0000 | ORAL_TABLET | Freq: Two times a day (BID) | ORAL | 1 refills | Status: DC

## 2018-07-30 MED ORDER — ATORVASTATIN CALCIUM 20 MG PO TABS: 20.0000 mg | ORAL_TABLET | Freq: Every day | ORAL | 1 refills | Status: DC

## 2018-07-30 MED ORDER — CARVEDILOL 12.5 MG PO TABS: 12.5000 mg | ORAL_TABLET | Freq: Two times a day (BID) | ORAL | 1 refills | Status: DC

## 2018-07-30 MED ORDER — CLOPIDOGREL BISULFATE 75 MG PO TABS: 75.0000 mg | ORAL_TABLET | Freq: Every day | ORAL | 1 refills | Status: DC

## 2018-07-30 MED ORDER — NITROGLYCERIN 0.4 MG SL SUBL: 0.4000 mg | SUBLINGUAL_TABLET | SUBLINGUAL | 2 refills | Status: DC | PRN

## 2018-07-30 MED ORDER — OMEGA-3-ACID ETHYL ESTERS 1 G PO CAPS: 2.0000 | ORAL_CAPSULE | Freq: Two times a day (BID) | ORAL | 6 refills | Status: DC

## 2018-07-30 MED ORDER — SPIRONOLACTONE 25 MG PO TABS: ORAL_TABLET | ORAL | 1 refills | Status: DC

## 2018-07-30 NOTE — Telephone Encounter (Signed)
Caren Griffins,   THE PT NEEDS THE RX'S PRINTED AND SIGNED DUE TO THE FACT HE USES MULTIPLE PHARMACIES. I PRINTED RX'S AND PUT IN YOUR BLUE FOLDER.  PT SAYS HE CAN PICK UP FIRST THING BEGINNING OF NEXT WEEK. I TOLD THE PT I WOULD CALL HIM BACK TO LET HIM KNOW HE CAN COME PICK UP FROM FRONT.

## 2018-08-04 ENCOUNTER — Other Ambulatory Visit: Payer: Self-pay

## 2018-08-04 ENCOUNTER — Other Ambulatory Visit: Payer: Self-pay | Admitting: Cardiology

## 2018-08-04 MED ORDER — CLOPIDOGREL BISULFATE 75 MG PO TABS: 75.0000 mg | ORAL_TABLET | Freq: Every day | ORAL | 1 refills | Status: DC

## 2018-08-04 NOTE — Addendum Note (Signed)
Addended by: Lajoyce Corners on: 08/04/2018 10:18 AM   Modules accepted: Orders

## 2018-08-05 ENCOUNTER — Telehealth: Payer: Self-pay | Admitting: Cardiology

## 2018-08-05 NOTE — Telephone Encounter (Signed)
Patient would like nurse to give him a call, he wants to know what he needs to do about his meds and the virus that is going on.  What he needs to do to protect himself?

## 2018-08-05 NOTE — Telephone Encounter (Signed)
Spoke with the patient, advised him about coricidin. He had no further questions.

## 2018-08-14 ENCOUNTER — Other Ambulatory Visit: Payer: Self-pay | Admitting: Family Medicine

## 2018-08-14 NOTE — Telephone Encounter (Signed)
Electronic refill request. Colchicine Last office visit:   07/09/2018 Last Filled:    30 tablet 0 01/18/2018  Please advise.

## 2018-08-15 NOTE — Telephone Encounter (Signed)
Sent. Thanks.   

## 2018-12-03 ENCOUNTER — Encounter: Payer: Self-pay | Admitting: Family Medicine

## 2018-12-03 ENCOUNTER — Ambulatory Visit: Payer: Self-pay | Admitting: Family Medicine

## 2018-12-03 ENCOUNTER — Other Ambulatory Visit: Payer: Self-pay

## 2018-12-03 VITALS — BP 100/54 | HR 80 | Temp 98.5°F | Resp 18 | Ht 72.0 in | Wt 284.5 lb

## 2018-12-03 DIAGNOSIS — M109 Gout, unspecified: Secondary | ICD-10-CM

## 2018-12-03 MED ORDER — GABAPENTIN 100 MG PO CAPS: 100.0000 mg | ORAL_CAPSULE | Freq: Every day | ORAL | 0 refills | Status: DC

## 2018-12-03 MED ORDER — PREDNISONE 20 MG PO TABS: ORAL_TABLET | ORAL | 0 refills | Status: AC

## 2018-12-03 NOTE — Progress Notes (Signed)
Subjective:     Colton Wain. is a 63 y.o. male presenting for Foot Pain (pain, swelling and some redness present. symptoms x 2 days.)     HPI   #Foot pain - already taking colchicine - symptoms x 2 days - worst flare he has had  - has had gout issues x 4o years - left foot - great toe is the worst - [place] - has been drinking/eating more green things than normal  - has been avoiding allopurinol  - 3 attacks in the last 2 months - is having some electrical shock to the nerves   Review of Systems  Constitutional: Negative for chills and fever.  Gastrointestinal: Negative for nausea and vomiting.     Social History   Tobacco Use  Smoking Status Former Smoker  . Packs/day: 2.00  . Years: 45.00  . Pack years: 90.00  . Types: Cigarettes  . Quit date: 10/28/2015  . Years since quitting: 3.1  Smokeless Tobacco Never Used        Objective:    BP Readings from Last 3 Encounters:  12/03/18 (!) 100/54  07/10/18 111/81  07/09/18 110/68   Wt Readings from Last 3 Encounters:  12/03/18 284 lb 8 oz (129 kg)  07/10/18 297 lb 3 oz (134.8 kg)  07/09/18 297 lb 3 oz (134.8 kg)    BP (!) 100/54   Pulse 80   Temp 98.5 F (36.9 C)   Resp 18   Ht 6' (1.829 m)   Wt 284 lb 8 oz (129 kg)   BMI 38.59 kg/m    Physical Exam Constitutional:      Appearance: Normal appearance. He is not ill-appearing or diaphoretic.  HENT:     Right Ear: External ear normal.     Left Ear: External ear normal.     Nose: Nose normal.  Eyes:     General: No scleral icterus.    Extraocular Movements: Extraocular movements intact.     Conjunctiva/sclera: Conjunctivae normal.  Neck:     Musculoskeletal: Neck supple.  Cardiovascular:     Rate and Rhythm: Normal rate.  Pulmonary:     Effort: Pulmonary effort is normal.  Skin:    General: Skin is warm and dry.     Comments: Left foot with mild erythema of the great toe and into the MTP join with some  faint erythema on the forefoot. Not warm to the touch  Neurological:     Mental Status: He is alert. Mental status is at baseline.  Psychiatric:        Mood and Affect: Mood normal.           Assessment & Plan:   Problem List Items Addressed This Visit      Other   GOUT - Primary    Per patient 3rd flare in 2 months. He is planning to modify diet to prevent. Advised prednisone since this does not seem to be responding to colchicine. Discussed that given several recent flares could consider longer course, but pt preferred shorter course. 9 day taper prescribed. Pt complaining of a lot of nerve pain and requested hydrocodone. Discussed that I did not think opiates were appropriate and he was willing to try gabapentin for nerve pain in foot.        Relevant Medications   predniSONE (DELTASONE) 20 MG tablet   gabapentin (NEURONTIN) 100 MG capsule       Return if symptoms worsen  or fail to improve.  Lesleigh Noe, MD

## 2018-12-03 NOTE — Assessment & Plan Note (Signed)
Per patient 3rd flare in 2 months. He is planning to modify diet to prevent. Advised prednisone since this does not seem to be responding to colchicine. Discussed that given several recent flares could consider longer course, but pt preferred shorter course. 9 day taper prescribed. Pt complaining of a lot of nerve pain and requested hydrocodone. Discussed that I did not think opiates were appropriate and he was willing to try gabapentin for nerve pain in foot.

## 2019-01-17 ENCOUNTER — Other Ambulatory Visit: Payer: Self-pay | Admitting: Family Medicine

## 2019-02-01 ENCOUNTER — Other Ambulatory Visit: Payer: Self-pay | Admitting: Physician Assistant

## 2019-02-16 NOTE — Progress Notes (Signed)
Cardiology Office Note:    Date:  02/17/2019   ID:  Colton Bors., DOB 1955/09/01, MRN [id number]  PCP:  Joaquim Nam, MD  Cardiologist:  Armanda Magic, MD    Referring MD: Joaquim Nam, MD   Chief Complaint  Patient presents with  . Cardiomyopathy  . Coronary Artery Disease  . Hypertension  . Hyperlipidemia    History of Present Illness:    Colton [name]. is a 63 y.o. male with a hx of with ahistory ofCVA 10/2015 treated with IV TPA, moderateLV dysfunction with EF 35-40%,ASCAD withcath 9/12/17showing80% mid CFX lesion, treated with a DES, and a 60% LAD lesion, not significant by FFR. The pt declined high dose statin Rx. Repeat 2D echo 04/10/2016 showed EF 35-40% with diffuse HK.Cardiac MRI showed EF 42% with HK of the mid anterior wall and late gad uptake in the endocardium of the basal and mid anterior walls c/w prior non-transmural infarct.  He is here today for followup and is doing well.  He denies any chest pain or pressure, SOB, DOE, PND, orthopnea, LE edema, , palpitations or syncope. His only complaint is that he is having problems with dizziness if he stands up for too long or stands up quickly.  He is compliant with his meds and is tolerating meds with no SE.    Past Medical History:  Diagnosis Date  . Coronary artery disease    cath 01/2016 with 60% LAD with FFR 0.82 and 80% mid LCX s/p PCI  . Diabetes mellitus without complication (HCC)   . Dilated cardiomyopathy (HCC)    EF 35-40% by echo  . Discoid lupus   . Fatty liver   . Former smoker   . Gout 07/2001   [facility]  . History of gout   . Hyperlipidemia LDL goal <70   . Hypertension   . Rosacea   . Stroke [facility])    s/p tpa 2017 w/o residual defecit    Past Surgical History:  Procedure Laterality Date  . CARDIAC CATHETERIZATION N/A 02/06/2016   Procedure: Left Heart Cath and Coronary Angiography;  Surgeon: Corky Crafts, MD;  Location: [facility] INVASIVE CV LAB;  Service:  Cardiovascular;  Laterality: N/A;  . CARDIAC CATHETERIZATION N/A 02/06/2016   Procedure: Coronary Stent Intervention;  Surgeon: Corky Crafts, MD;  Location: [facility] INVASIVE CV LAB;  Service: Cardiovascular;  Laterality: N/A;  . CARDIAC CATHETERIZATION N/A 02/06/2016   Procedure: Intravascular Pressure Wire/FFR Study;  Surgeon: Corky Crafts, MD;  Location[facility] INVASIVE CV LAB;  Service: Cardiovascular;  Laterality: N/A;  . Cardiolite  08/16/2008   [facility]    Current Medications: Current Meds  Medication Sig  . aspirin 81 MG chewable tablet Chew 1 tablet (81 mg total) by mouth daily.  [name] atorvastatin (LIPITOR) 20 MG tablet TAKE ONE TABLET BY MOUTH DAILY  . carvedilol (COREG) 12.5 MG tablet TAKE ONE TABLET BY MOUTH TWICE A DAY  . clopidogrel (PLAVIX) 75 MG tablet Take 1 tablet (75 mg total) by mouth daily.  . colchicine 0.6 MG tablet TAKE ONE TABLET BY MOUTH DAILY AS NEEDED FOR GOUT  . nitroGLYCERIN (NITROSTAT) 0.4 MG SL tablet Place 1 tablet (0.4 mg total) under the tongue every 5 (five) minutes as needed for chest pain.  [name] omega-3 acid ethyl esters (LOVAZA) 1 g capsule Take 2 capsules (2 g total) by mouth 2 (two) times daily.  [name] spironolactone (ALDACTONE) 25 MG tablet TAKE 1/2 TABLET BY MOUTH DAILY  . Tetrahydrozoline  HCl (VISINE OP) Place 1 drop into both eyes as needed (for dry eyes).  . [DISCONTINUED] gabapentin (NEURONTIN) 100 MG capsule Take 1 capsule (100 mg total) by mouth at bedtime.  . [DISCONTINUED] sacubitril-valsartan (ENTRESTO) 97-103 MG Take 1 tablet by mouth 2 (two) times daily.     Allergies:   Corticosteroids, Iodine, and Latex   Social History   Socioeconomic History  . Marital status: Widowed    Spouse name: Not on file  . Number of children: 0  . Years of education: Not on file  . Highest education level: Not on file  Occupational History  . Occupation: Property [employer]  Social Needs  . Financial resource strain: Not on  file  . Food insecurity    Worry: Not on file    Inability: Not on file  . Transportation needs    Medical: Not on file    Non-medical: Not on file  Tobacco Use  . Smoking status: Former Smoker    Packs/day: 2.00    Years: 45.00    Pack years: 90.00    Types: Cigarettes    Quit date: 10/28/2015    Years since quitting: 3.3  . Smokeless tobacco: Never Used  Substance and Sexual Activity  . Alcohol use: Yes    Alcohol/week: 2.0 standard drinks    Types: 1 Glasses of wine, 1 Shots of liquor per week  . Drug use: No  . Sexual activity: Not Currently  Lifestyle  . Physical activity    Days per week: Not on file    Minutes per session: Not on file  . Stress: Not on file  Relationships  . Social Herbalist on phone: Not on file    Gets together: Not on file    Attends religious service: Not on file    Active member of club or organization: Not on file    Attends meetings of clubs or organizations: Not on file    Relationship status: Not on file  Other Topics Concern  . Not on file  Social History Narrative   [employer] '78-'79   Prev investment banking, now trades crude Programme researcher, broadcasting/film/video   Widowed, wife died of mesothelioma   No kids     Family History: The patient's family history includes CAD in his father and another family member; Heart disease in his father and another family member; Lung cancer in an other family member. There is no history of Colon cancer or Prostate cancer.  ROS:   Please see the history of present illness.    ROS  All other systems reviewed and negative.   EKGs/Labs/Other Studies Reviewed:    The following studies were reviewed today: none  EKG:  EKG is  ordered today.  The ekg ordered today demonstrates NSR  Recent Labs: 07/10/2018: ALT 25; BUN 15; Creatinine, Ser 1.25; Hemoglobin 13.2; Platelets 112; Potassium 4.8; Sodium 139   Recent Lipid Panel    Component Value Date/Time   CHOL 131 07/03/2018 0946   TRIG 168 (H)  07/03/2018 0946   HDL 32 (L) 07/03/2018 0946   CHOLHDL 4.1 07/03/2018 0946   CHOLHDL 5.8 (H) 04/10/2016 0917   VLDL 75 (H) 04/10/2016 0917   LDLCALC 65 07/03/2018 0946   LDLDIRECT 104.4 05/12/2008 1304    Physical Exam:    VS:  BP 100/60   Pulse 76   Ht 6\' 1"  (1.854 m)   Wt 289 lb (131.1 kg)   SpO2 98%   BMI 38.13 kg/m  Wt Readings from Last 3 Encounters:  02/17/19 289 lb (131.1 kg)  12/03/18 284 lb 8 oz (129 kg)  07/10/18 297 lb 3 oz (134.8 kg)     GEN:  Well nourished, well developed in no acute distress HEENT: Normal NECK: No JVD; No carotid bruits LYMPHATICS: No lymphadenopathy CARDIAC: RRR, no murmurs, rubs, gallops RESPIRATORY:  Clear to auscultation without rales, wheezing or rhonchi  ABDOMEN: Soft, non-tender, non-distended MUSCULOSKELETAL:  No edema; No deformity  SKIN: Warm and dry  NEUROLOGIC:  Alert and oriented x 3 PSYCHIATRIC:  Normal affect   ASSESSMENT:    1. DCM (dilated cardiomyopathy) (HCC)   2. Coronary artery disease involving native coronary artery of native heart without angina pectoris   3. Essential hypertension   4. Hyperlipidemia, unspecified hyperlipidemia type    PLAN:    In order of problems listed above:  1.  DCM - EF 35-40% by echo 2017 and 42% by cardiac MRI 05/2016.  This is likely mixed ischemic/nonischemic.  He appears euvolemic on exam today.  He will continue on Carvedilol 12.5mg  BID and Spiro 12.5mg  daily.  I have instructed him to decrease his Entresto to 49-51mg  BID due to soft BP.  I checked with PharmD and it is ok to cut his 97-103mg  tablets in half until he has used them up and then will call in new Rx for the 49-51mg  tablets.  He will check his BP daily for a week and call with the results. Creatinine was 1.25 on 06/2018/    2.  ASCAD - cath 9/12/17showing80% mid CFX lesion, treated with a DES, and a 60% LAD lesion, not significant by FFR.  He has not had any anginal sx.  He will continue on ASA 81mg  daily, BB and  Plavix and statin.   3 .  HTN - BP is well controlled on exam today.  Continue Spiro, Carvedilol and lower dose of Entresto (see above #1)     4.  Hyperlipidemia with LDL goal < 70.  He will continue on lipitor 20mg  daily. LDL was 65 in Feb 2020.    Medication Adjustments/Labs and Tests Ordered: Current medicines are reviewed at length with the patient today.  Concerns regarding medicines are outlined above.  No orders of the defined types were placed in this encounter.  Meds ordered this encounter  Medications  . sacubitril-valsartan (ENTRESTO) 49-51 MG    Sig: Take 1 tablet by mouth 2 (two) times daily.    Dispense:  60 tablet    Refill:  11    Signed, , MD  02/17/2019 8:45 AM    [facility]

## 2019-02-17 ENCOUNTER — Encounter: Payer: Self-pay | Admitting: Cardiology

## 2019-02-17 ENCOUNTER — Other Ambulatory Visit: Payer: Self-pay

## 2019-02-17 ENCOUNTER — Ambulatory Visit (INDEPENDENT_AMBULATORY_CARE_PROVIDER_SITE_OTHER): Payer: Self-pay | Admitting: Cardiology

## 2019-02-17 VITALS — BP 100/60 | HR 76 | Ht 73.0 in | Wt 289.0 lb

## 2019-02-17 DIAGNOSIS — I2510 Atherosclerotic heart disease of native coronary artery without angina pectoris: Secondary | ICD-10-CM

## 2019-02-17 DIAGNOSIS — I10 Essential (primary) hypertension: Secondary | ICD-10-CM

## 2019-02-17 DIAGNOSIS — E785 Hyperlipidemia, unspecified: Secondary | ICD-10-CM

## 2019-02-17 DIAGNOSIS — I420 Dilated cardiomyopathy: Secondary | ICD-10-CM

## 2019-02-17 MED ORDER — ENTRESTO 49-51 MG PO TABS: 1.0000 | ORAL_TABLET | Freq: Two times a day (BID) | ORAL | 11 refills | Status: DC

## 2019-02-17 NOTE — Patient Instructions (Signed)
Medication Instructions:  Your physician has recommended you make the following change in your medication:  1.  CUT the Entresto in 1/2 and take 1/2 tablet twice a day.  Please monitor your blood pressure 1 time a day for a week and send a mychart message to Dr. Radford Pax or call in the readings.  If you need a refill on your cardiac medications before your next appointment, please call your pharmacy.   Lab work: None ordered  If you have labs (blood work) drawn today and your tests are completely normal, you will receive your results only by: [name] MyChart Message (if you have MyChart) OR . A paper copy in the mail If you have any lab test that is abnormal or we need to change your treatment, we will call you to review the results.  Testing/Procedures: None ordered  Follow-Up: At [facility], you and your health needs are our priority.  As part of our continuing mission to provide you with exceptional heart care, we have created designated Provider Care Teams.  These Care Teams include your primary Cardiologist (physician) and Advanced Practice Providers (APPs -  Physician Assistants and Nurse Practitioners) who all work together to provide you with the care you need, when you need it. You will need a follow up appointment in 6 months.  Please call our office 2 months in advance to schedule this appointment.  You may see Fransico Him, MD or one of the following Advanced Practice Providers on your designated Care Team:   [place], PA-C Melina Copa, PA-C . Ermalinda Barrios, PA-C  Any Other Special Instructions Will Be Listed Below (If Applicable).

## 2019-03-26 ENCOUNTER — Other Ambulatory Visit: Payer: Self-pay | Admitting: Family Medicine

## 2019-03-29 NOTE — Telephone Encounter (Signed)
Electronic refill request. Colchicine Last office visit:   12/03/2018 Acute - [facility] Last Filled:    30 tablet 0 01/18/2019  Please advise.

## 2019-03-29 NOTE — Telephone Encounter (Signed)
Sent. Thanks.  Needs Dm2 f/u.

## 2019-03-30 NOTE — Telephone Encounter (Signed)
Letter mailed

## 2019-04-08 ENCOUNTER — Telehealth: Payer: Self-pay | Admitting: Cardiology

## 2019-04-08 NOTE — Telephone Encounter (Signed)
I s/w the pt in regards to him needing oral surgery. I explained to him that I will need the surgeon's office to fax over to me the clearance information. Our Pre OP Team will evaluate clearance. Surgeon is Dr. Salli Real III with [facility] in [place] ph# [phone]. I stated I will reach out [facility] for them to please fax clearance information to me. Pt thanked me for the call and the help. Pt states he is looking at having this done around sometime in 05/2019. States he is having a dental implant though the first step will be the removal of 2 teeth, then healing time of 3 months then implant.   I reached out to [facility] and lmom that I will need a clearance form faxed to our office [phone]. Information needed will be procedure type, anesthesia, meds to be held if any and if so how long.

## 2019-04-08 NOTE — Telephone Encounter (Signed)
New Message     Pt says he needs to have oral surgery done, and is needing advice  He would like for a nurse to call him   Please call

## 2019-04-26 ENCOUNTER — Other Ambulatory Visit: Payer: Self-pay | Admitting: Family Medicine

## 2019-04-27 NOTE — Telephone Encounter (Signed)
Electronic refill request. Colchicine Last office visit:   12/03/2018 Acute - [facility] Last Filled:    30 tablet 0 03/29/2019  Please advise.

## 2019-04-28 NOTE — Telephone Encounter (Signed)
Sent. Please check with patient and see if this was auto refill or if patient has needed med daily.  Thanks.

## 2019-04-29 ENCOUNTER — Encounter: Payer: Self-pay | Admitting: Cardiology

## 2019-04-29 NOTE — Telephone Encounter (Signed)
error 

## 2019-04-29 NOTE — Telephone Encounter (Signed)
Patient states he is taking this on a daily basis and requests refills also.

## 2019-04-30 ENCOUNTER — Other Ambulatory Visit: Payer: Self-pay | Admitting: Family Medicine

## 2019-04-30 DIAGNOSIS — E119 Type 2 diabetes mellitus without complications: Secondary | ICD-10-CM

## 2019-04-30 DIAGNOSIS — M109 Gout, unspecified: Secondary | ICD-10-CM

## 2019-04-30 NOTE — Telephone Encounter (Signed)
I thought he was only taking this as needed.  He needs follow-up labs.  I put in the orders.  He needs a follow-up visit after that regarding diabetes and gout.  If he is needing colchicine every day we need to look at other options for preventive medications after which check his uric acid.  Thanks.

## 2019-04-30 NOTE — Telephone Encounter (Signed)
Patient says that he did not know he had diabetes.  Lab appt and OV scheduled.

## 2019-05-02 NOTE — Telephone Encounter (Signed)
In the nicest way possible please make sure he is aware that he is diabetic before he comes in for the office visit. I do not know how it is possible that he would not know he had diabetes based on his last office visit.  The entire point of the visit was about diabetes, diet, exercise, his labs, and then diabetic follow-up.

## 2019-05-03 ENCOUNTER — Other Ambulatory Visit: Payer: Self-pay

## 2019-05-03 ENCOUNTER — Other Ambulatory Visit (INDEPENDENT_AMBULATORY_CARE_PROVIDER_SITE_OTHER): Payer: Self-pay

## 2019-05-03 DIAGNOSIS — M109 Gout, unspecified: Secondary | ICD-10-CM

## 2019-05-03 DIAGNOSIS — E119 Type 2 diabetes mellitus without complications: Secondary | ICD-10-CM

## 2019-05-03 LAB — BASIC METABOLIC PANEL
BUN: 16 mg/dL (ref 6–23)
CO2: 26 mEq/L (ref 19–32)
Calcium: 9 mg/dL (ref 8.4–10.5)
Chloride: 104 mEq/L (ref 96–112)
Creatinine, Ser: 1.25 mg/dL (ref 0.40–1.50)
GFR: 58.14 mL/min — ABNORMAL LOW (ref >60.00)
Glucose, Bld: 171 mg/dL — ABNORMAL HIGH (ref 70–99)
Potassium: 4.7 mEq/L (ref 3.5–5.1)
Sodium: 139 mEq/L (ref 135–145)

## 2019-05-03 LAB — HEMOGLOBIN A1C: Hgb A1c MFr Bld: 6.5 % (ref 4.6–6.5)

## 2019-05-03 LAB — URIC ACID: Uric Acid, Serum: 8.8 mg/dL — ABNORMAL HIGH (ref 4.0–7.8)

## 2019-05-03 NOTE — Telephone Encounter (Signed)
From the previous conversation I had with the patient reporting his lab results, I do not feel comfortable relaying the message to the patient prior to his office visit.  He is scheduled for an office visit on Friday with labs prior.  Can that conversation take place at that time?

## 2019-05-03 NOTE — Telephone Encounter (Signed)
I can talk to him about it.  Thanks.

## 2019-05-07 ENCOUNTER — Ambulatory Visit (INDEPENDENT_AMBULATORY_CARE_PROVIDER_SITE_OTHER): Payer: Self-pay | Admitting: Family Medicine

## 2019-05-07 ENCOUNTER — Encounter: Payer: Self-pay | Admitting: Family Medicine

## 2019-05-07 ENCOUNTER — Other Ambulatory Visit: Payer: Self-pay

## 2019-05-07 DIAGNOSIS — M109 Gout, unspecified: Secondary | ICD-10-CM

## 2019-05-07 DIAGNOSIS — E119 Type 2 diabetes mellitus without complications: Secondary | ICD-10-CM

## 2019-05-07 NOTE — Patient Instructions (Addendum)
I'll defer to rheumatology about gout follow up.  Recheck A1c here at a visit in about 3-4 months.  You don't have to fast.  Use the eat right diet. If your weight is gradually decreasing then that would help.   If you are lightheaded, then update cardiology as you may need to taper your BP meds.   Take care.  Glad to see you.

## 2019-05-07 NOTE — Progress Notes (Signed)
This visit occurred during the SARS-CoV-2 public health emergency.  Safety protocols were in place, including screening questions prior to the visit, additional usage of staff PPE, and extensive cleaning of exam room while observing appropriate contact time as indicated for disinfecting solutions.  Diabetes:  No meds.  Hypoglycemic episodes:  No sx  Hyperglycemic episodes: no sx Feet problems: no Blood Sugars averaging: not checked.  eye exam within last year: has f/u pending.  Low carb diet d/w pt, handout given to patient.  A1c discussed with patient at office visit.  Gout.  Saw rheum 2 days ago and started on allopurinol, will pick up rx today.  Discussed use of allopurinol vs colchicine use and rationale for treatment.  Was seen at [facility] medical rheum clinic.  Discussed taper of colchicine to prn when on allopurinol. He'll have 6 month f/u with them in 2021.    Meds, vitals, and allergies reviewed.   ROS: Per HPI unless specifically indicated in ROS section   GEN: nad, alert and oriented HEENT: ncat NECK: supple w/o LA CV: rrr. PULM: ctab, no inc wob ABD: soft, +bs EXT: no edema SKIN: no acute rash

## 2019-05-09 NOTE — Assessment & Plan Note (Signed)
Low carbohydrate diet discussed with patient and handout explained/given to patient.  No change in meds at this point.  Recheck periodically.  See after visit summary.  He could significantly improve his A1c with diet and exercise.  Discussed.  He agrees.  He understood he is diabetic based on his labs.  Pleasant conversation with patient.

## 2019-05-09 NOTE — Assessment & Plan Note (Signed)
Labs discussed with patient.  Recently prescribed allopurinol per rheumatology and I will defer at this point.  He agrees.  I appreciate the help of all involved.  Gout pathophysiology and treatment discussed with patient.

## 2019-06-01 ENCOUNTER — Telehealth: Payer: Self-pay | Admitting: Cardiology

## 2019-06-01 NOTE — Telephone Encounter (Signed)
1. What dental office are you calling from? [facility]  2. What is your office phone number? [phone]  3. What is your fax number? [phone]  4. What type of procedure is the patient having performed? 2 teeth pulled   5. What date is procedure scheduled or is the patient there now? Not scheduled yet  (if the patient is at the dentist's office question goes to their cardiologist if he/she is in the office.  If not, question should go to the DOD).   6. What is your question (ex. Antibiotics prior to procedure, holding medication-we need to know how long dentist wants pt to hold med)? Patient had a stroke about 2 months ago and needs 2 teeth pulled. His dentist would like to know if they can schedule this.

## 2019-06-02 NOTE — Telephone Encounter (Signed)
   Primary Cardiologist: Armanda Magic, MD  Chart reviewed as part of pre-operative protocol coverage. Simple dental extractions (1-2 teeth) are considered low risk procedures per guidelines and generally do not require any specific cardiac clearance. It is also generally accepted that for simple extractions (1-2 teeth) and dental cleanings, there is no need to interrupt blood thinner therapy.   SBE prophylaxis is not required for the patient.  I will route this recommendation to the requesting party via Epic fax function and remove from pre-op pool.  Please call with questions.  Beatriz Stallion, PA-C 06/02/2019, 8:47 AM

## 2019-06-28 ENCOUNTER — Telehealth: Payer: Self-pay | Admitting: Family Medicine

## 2019-06-28 NOTE — Telephone Encounter (Signed)
Patient called and would like to know if there is anyway we can tell his blood type. Advised patient that I would send this back to the nurse, patient has not recently had any blood work done with [facility] to get this from them

## 2019-06-28 NOTE — Telephone Encounter (Signed)
Patient advised that we do not have that information and that it can best be done through [facility].

## 2019-07-26 ENCOUNTER — Telehealth: Payer: Self-pay | Admitting: Cardiology

## 2019-07-26 NOTE — Telephone Encounter (Signed)
Patient states that he would like [place] to fax over his patient assistance form for [facility] with the prescription. He also would like [place] to mail him the signed copy. He states that Dr. Mayford Knife had decreased his dose last year due to hypotension but he never did so because it resolved. He is wanting to continue his original dose.

## 2019-07-26 NOTE — Telephone Encounter (Signed)
Patient stating he dropped off some documents to the office for Dr. Mayford Knife, he was wondering if they were ready to be picked up.

## 2019-07-26 NOTE — Telephone Encounter (Signed)
Follow Up  Pt called to return phone call from [place]. Pt also has question about his medication  Please call back

## 2019-07-26 NOTE — Telephone Encounter (Signed)
Yes that is fine

## 2019-07-26 NOTE — Telephone Encounter (Signed)
Left message for patient to call back.  We did receive his paperwork, just need to know if he wants to come pick it up or if we can fax it in for him.

## 2019-08-04 ENCOUNTER — Ambulatory Visit: Payer: Self-pay | Admitting: Family Medicine

## 2019-08-04 ENCOUNTER — Other Ambulatory Visit: Payer: Self-pay

## 2019-08-04 ENCOUNTER — Other Ambulatory Visit: Payer: Self-pay | Admitting: Physician Assistant

## 2019-08-04 ENCOUNTER — Encounter: Payer: Self-pay | Admitting: Family Medicine

## 2019-08-04 VITALS — BP 118/66 | HR 72 | Temp 97.6°F | Ht 73.0 in | Wt 300.0 lb

## 2019-08-04 DIAGNOSIS — J029 Acute pharyngitis, unspecified: Secondary | ICD-10-CM

## 2019-08-04 DIAGNOSIS — J019 Acute sinusitis, unspecified: Secondary | ICD-10-CM

## 2019-08-04 MED ORDER — AZITHROMYCIN 250 MG PO TABS: ORAL_TABLET | ORAL | 0 refills | Status: DC

## 2019-08-04 MED ORDER — ENTRESTO 49-51 MG PO TABS: 1.0000 | ORAL_TABLET | Freq: Two times a day (BID) | ORAL | 11 refills | Status: DC

## 2019-08-04 NOTE — Assessment & Plan Note (Addendum)
With ST, L ear pain, feverish. With comorbidities and upcoming travel prudent to r/o covid. Will send for testing tomorrow afternoon - no slots available in [place] today.  Anticipate acute viral sinusitis given short duration.  Reviewed supportive care measures at home.  WASP for azithromycin with indications when to fill. Did not prescribe biaxin due to multiple drug interactions. He is aware to avoid colchicine if he starts zpack.  Pt agrees with plan.  Recommend against covid serology at this time - as regardless of results would still recommend he complete covid vaccine.

## 2019-08-04 NOTE — Patient Instructions (Addendum)
Prudent to rule out covid. Go tomorrow for test at 3pm.  If negative, likely sinusitis. Anticipate viral at this time. Recommend supportive care -  fluids, rest, tylenol 500mg  for sinus pain, flonase, nasal saline spray.  If not improving in 7-10 days, fill Zpack sent to pharmacy.  Let 9-10 know if not improving with treatment.   Thursday August 05, 2019 Arrive by 3:00 PM Starts at 3:00 PM (15 minutes)   PATIENT [facility] CENTER [phone]  This visit is at the [facility] [address] [place], [place], [place] Someone will direct you where to park. For scheduling questions, or to cancel please call [phone].

## 2019-08-04 NOTE — Progress Notes (Signed)
This visit was conducted in person.  BP 118/66 (BP Location: Left Arm, Patient Position: Sitting, Cuff Size: Large)   Pulse 72   Temp 97.6 F (36.4 C) (Temporal)   Ht 6\' 1"  (1.854 m)   Wt 300 lb (136.1 kg)   SpO2 96%   BMI 39.58 kg/m    CC: L ear pain Subjective:    Patient ID: Colton [name]., male    DOB: Dec 16, 1955, 64 y.o.   MRN: [id number]  HPI: Colton Antigua. is a 64 y.o. male presenting on 08/04/2019 for Ear Pain (C/o left ear pain.  Also, c/o ST.  Sxs started 07/31/19. Thinks he has sinus infection. )   4d h/o L ear pain with sore throat, L swollen gland, feeling feverish.  No tooth pain, headache, cough, dyspnea, loss of taste or smell, abd pain, nausea, diarrhea. No body aches.   Hasn't tried anything for this.   H/o sinus and ear infections in the past - this feels similar. Last 5-6 yrs ago. States shot of rocephin and biaxin works every time.  Upcoming important business meeting in 09/30/19 in 3 days.    Also requests antibody test for covid. Thinks he may have had covid 02/2019 - fever with bad headache. Wants antibody to determine if he needs to receive covid vaccine.      Relevant past medical, surgical, family and social history reviewed and updated as indicated. Interim medical history since our last visit reviewed. Allergies and medications reviewed and updated. Outpatient Medications Prior to Visit  Medication Sig Dispense Refill  . allopurinol (ZYLOPRIM) 100 MG tablet Take 200 mg by mouth daily.    03/2019 aspirin 81 MG chewable tablet Chew 1 tablet (81 mg total) by mouth daily.    [name] atorvastatin (LIPITOR) 20 MG tablet TAKE ONE TABLET BY MOUTH DAILY 90 tablet 1  . carvedilol (COREG) 12.5 MG tablet TAKE ONE TABLET BY MOUTH TWICE A DAY 180 tablet 1  . clopidogrel (PLAVIX) 75 MG tablet Take 1 tablet (75 mg total) by mouth daily. 90 tablet 1  . colchicine 0.6 MG tablet TAKE 1 TABLET BY MOUTH DAILY AS NEEDED FOR GOUT 30 tablet 0  . nitroGLYCERIN  (NITROSTAT) 0.4 MG SL tablet Place 1 tablet (0.4 mg total) under the tongue every 5 (five) minutes as needed for chest pain. 25 tablet 2  . omega-3 acid ethyl esters (LOVAZA) 1 g capsule Take 2 capsules (2 g total) by mouth 2 (two) times daily. 120 capsule 6  . sacubitril-valsartan (ENTRESTO) 49-51 MG Take 1 tablet by mouth 2 (two) times daily. 60 tablet 11  . spironolactone (ALDACTONE) 25 MG tablet TAKE 1/2 TABLET BY MOUTH DAILY 45 tablet 1  . Tetrahydrozoline HCl (VISINE OP) Place 1 drop into both eyes as needed (for dry eyes).     No facility-administered medications prior to visit.     Per HPI unless specifically indicated in ROS section below Review of Systems Objective:    BP 118/66 (BP Location: Left Arm, Patient Position: Sitting, Cuff Size: Large)   Pulse 72   Temp 97.6 F (36.4 C) (Temporal)   Ht 6\' 1"  (1.854 m)   Wt 300 lb (136.1 kg)   SpO2 96%   BMI 39.58 kg/m   Wt Readings from Last 3 Encounters:  08/04/19 300 lb (136.1 kg)  05/07/19 295 lb 1 oz (133.8 kg)  02/17/19 289 lb (131.1 kg)    Physical Exam Vitals and nursing note reviewed.  Constitutional:  General: He is not in acute distress.    Appearance: Normal appearance. He is well-developed. He is obese.  HENT:     Head: Normocephalic and atraumatic.     Right Ear: Hearing, tympanic membrane, ear canal and external ear normal.     Left Ear: Hearing, tympanic membrane, ear canal and external ear normal.     Nose: Nasal tenderness present. No rhinorrhea.     Right Sinus: Maxillary sinus tenderness and frontal sinus tenderness present.     Left Sinus: Maxillary sinus tenderness and frontal sinus tenderness present.     Comments: Nasal mucosal congestion /erythema    Mouth/Throat:     Mouth: Mucous membranes are moist.     Pharynx: Oropharynx is clear. Uvula midline. No oropharyngeal exudate or posterior oropharyngeal erythema.     Tonsils: No tonsillar abscesses.  Eyes:     General: No scleral icterus.     Extraocular Movements: Extraocular movements intact.     Conjunctiva/sclera: Conjunctivae normal.     Pupils: Pupils are equal, round, and reactive to light.  Cardiovascular:     Rate and Rhythm: Normal rate and regular rhythm.     Pulses: Normal pulses.     Heart sounds: Normal heart sounds. No murmur.  Pulmonary:     Effort: Pulmonary effort is normal. No respiratory distress.     Breath sounds: No wheezing, rhonchi or rales.     Comments: Crackles bibasilarly Musculoskeletal:     Cervical back: Normal range of motion and neck supple.  Lymphadenopathy:     Head:     Right side of head: No submental, submandibular, tonsillar, preauricular or posterior auricular adenopathy.     Left side of head: No submental, submandibular, tonsillar, preauricular or posterior auricular adenopathy.     Cervical: No cervical adenopathy.     Upper Body:     Right upper body: No supraclavicular adenopathy.     Left upper body: No supraclavicular adenopathy.  Skin:    General: Skin is warm and dry.     Findings: No rash.  Neurological:     Mental Status: He is alert.       Results for orders placed or performed in visit on 05/03/19  future Uric acid  Result Value Ref Range   Uric Acid, Serum 8.8 (H) 4.0 - 7.8 mg/dL  future Hemoglobin A1c  Result Value Ref Range   Hgb A1c MFr Bld 6.5 4.6 - 6.5 %  future Basic metabolic panel  Result Value Ref Range   Sodium 139 135 - 145 mEq/L   Potassium 4.7 3.5 - 5.1 mEq/L   Chloride 104 96 - 112 mEq/L   CO2 26 19 - 32 mEq/L   Glucose, Bld 171 (H) 70 - 99 mg/dL   BUN 16 6 - 23 mg/dL   Creatinine, Ser 1.25 0.40 - 1.50 mg/dL   GFR 58.14 (L) >60.00 mL/min   Calcium 9.0 8.4 - 10.5 mg/dL   Assessment & Plan:  This visit occurred during the SARS-CoV-2 public health emergency.  Safety protocols were in place, including screening questions prior to the visit, additional usage of staff PPE, and extensive cleaning of exam room while observing appropriate contact  time as indicated for disinfecting solutions.   Problem List Items Addressed This Visit    Sore throat   Relevant Orders   Novel Coronavirus, NAA ([facility])   Acute sinusitis - Primary    With ST, L ear pain, feverish. With comorbidities and upcoming travel prudent to  r/o covid. Will send for testing tomorrow afternoon - no slots available in [place] today.  Anticipate acute viral sinusitis given short duration.  Reviewed supportive care measures at home.  WASP for azithromycin with indications when to fill. Did not prescribe biaxin due to multiple drug interactions. He is aware to avoid colchicine if he starts zpack.  Pt agrees with plan.  Recommend against covid serology at this time - as regardless of results would still recommend he complete covid vaccine.       Relevant Medications   azithromycin (ZITHROMAX) 250 MG tablet       Meds ordered this encounter  Medications  . azithromycin (ZITHROMAX) 250 MG tablet    Sig: Take two tablets on day one followed by one tablet on days 2-5    Dispense:  6 each    Refill:  0   Orders Placed This Encounter  Procedures  . Novel Coronavirus, NAA ([facility])    Order Specific Question:   Is this test for diagnosis or screening    Answer:   Diagnosis of ill patient    Order Specific Question:   Symptomatic for COVID-19 as defined by CDC    Answer:   Yes    Order Specific Question:   Date of Symptom Onset    Answer:   08/01/2019    Order Specific Question:   Hospitalized for COVID-19    Answer:   No    Order Specific Question:   Admitted to ICU for COVID-19    Answer:   No    Order Specific Question:   Previously tested for COVID-19    Answer:   No    Order Specific Question:   Resident in a congregate (group) care setting    Answer:   No    Order Specific Question:   Is the patient student?    Answer:   No    Order Specific Question:   Employed in healthcare setting    Answer:   No    Patient Instructions  Prudent to rule out  covid. Go tomorrow for test at 3pm.  If negative, likely sinusitis. Anticipate viral at this time. Recommend supportive care -  fluids, rest, tylenol 500mg  for sinus pain, flonase, nasal saline spray.  If not improving in 7-10 days, fill Zpack sent to pharmacy.  Let 9-10 know if not improving with treatment.   Thursday August 05, 2019 Arrive by 3:00 PM Starts at 3:00 PM (15 minutes)   PATIENT [facility] CENTER [phone]  This visit is at the [facility] [address] [place], [place], [place] Someone will direct you where to park. For scheduling questions, or to cancel please call [phone].   Follow up plan: Return if symptoms worsen or fail to improve.  [phone], MD

## 2019-08-05 ENCOUNTER — Other Ambulatory Visit: Payer: Self-pay | Admitting: Cardiology

## 2019-08-05 ENCOUNTER — Other Ambulatory Visit: Payer: Self-pay

## 2019-08-05 NOTE — Telephone Encounter (Signed)
 *  STAT* If patient is at the pharmacy, call can be transferred to refill team.   1. Which medications need to be refilled? (please list name of each medication and dose if known)   sacubitril-valsartan (ENTRESTO) 49-51 MG  2. Which pharmacy/location (including street and city if local pharmacy) is medication to be sent to?  [employer] fax: [phone] Reference: Geovanni Rahming Patient ID: [id number] 4 month supply    3. Do they need a 30 day or 90 day supply? 4 month supply  Patient would like a call once the new prescription is sent to [facility]

## 2019-08-06 ENCOUNTER — Telehealth: Payer: Self-pay | Admitting: Cardiology

## 2019-08-06 ENCOUNTER — Other Ambulatory Visit: Payer: Self-pay | Admitting: Physician Assistant

## 2019-08-06 MED ORDER — ENTRESTO 97-103 MG PO TABS: 1.0000 | ORAL_TABLET | Freq: Two times a day (BID) | ORAL | 3 refills | Status: DC

## 2019-08-06 MED ORDER — ENTRESTO 49-51 MG PO TABS: 1.0000 | ORAL_TABLET | Freq: Two times a day (BID) | ORAL | 5 refills | Status: DC

## 2019-08-06 NOTE — Telephone Encounter (Signed)
Patient states he needs nurse to call him.  Would not give any further information.

## 2019-08-06 NOTE — Telephone Encounter (Signed)
Called and spoke to patient. He states that he had Dr. Mayford Knife fill out a form for [employer] for his Sherryll Burger and it was to be faxed to them and mailed to him. He states that he received his in the mail. He states that we need to fax the Rx for entresto to [employer] at [phone] and include the Patient ID# [id number].   Patient's med list has him taking 49-51 mg BID. Patient is actually taking 97-103 mg BID and Dr. Mayford Knife was okay with the patient taking that dose (see telephone encounter from 07/26/19). Will update med list and fax RX to [facility].

## 2019-08-12 NOTE — Telephone Encounter (Signed)
**Note De-Identified Russ Looper Obfuscation** Letter received from [facility] [employer] stating that they have approved the pt for assistance with his Entresto. Approval good until 08/04/2020. Pt ID: [id number]  The letter states that they have notified the pt of this approval as well.

## 2019-08-24 ENCOUNTER — Telehealth: Payer: Self-pay | Admitting: Cardiology

## 2019-08-24 NOTE — Telephone Encounter (Signed)
Pt has been taking Entresto 97/103 mg  and new script sent via epic on 08/06/19 ./cy

## 2019-08-24 NOTE — Telephone Encounter (Signed)
New message   Pt c/o medication issue:  1. Name of Medication:   sacubitril-valsartan (ENTRESTO) 97-103 MG        2. How are you currently taking this medication (dosage and times per day)? n/a  3. Are you having a reaction (difficulty breathing--STAT)? n/a  4. What is your medication issue? Pharmacy has a question about the dosage for this medication. Please call.

## 2019-09-14 ENCOUNTER — Encounter: Payer: Self-pay | Admitting: Cardiology

## 2019-09-14 ENCOUNTER — Ambulatory Visit (INDEPENDENT_AMBULATORY_CARE_PROVIDER_SITE_OTHER): Payer: Self-pay | Admitting: Cardiology

## 2019-09-14 ENCOUNTER — Other Ambulatory Visit: Payer: Self-pay

## 2019-09-14 VITALS — BP 106/64 | HR 71 | Ht 72.0 in | Wt 305.6 lb

## 2019-09-14 DIAGNOSIS — I10 Essential (primary) hypertension: Secondary | ICD-10-CM

## 2019-09-14 DIAGNOSIS — E785 Hyperlipidemia, unspecified: Secondary | ICD-10-CM

## 2019-09-14 DIAGNOSIS — I420 Dilated cardiomyopathy: Secondary | ICD-10-CM

## 2019-09-14 DIAGNOSIS — I2510 Atherosclerotic heart disease of native coronary artery without angina pectoris: Secondary | ICD-10-CM

## 2019-09-14 DIAGNOSIS — R5383 Other fatigue: Secondary | ICD-10-CM

## 2019-09-14 MED ORDER — ENTRESTO 97-103 MG PO TABS: 1.0000 | ORAL_TABLET | Freq: Two times a day (BID) | ORAL | 3 refills | Status: DC

## 2019-09-14 MED ORDER — ASPIRIN 81 MG PO CHEW: 81.0000 mg | CHEWABLE_TABLET | Freq: Every day | ORAL | 3 refills | Status: AC

## 2019-09-14 MED ORDER — ATORVASTATIN CALCIUM 20 MG PO TABS: 20.0000 mg | ORAL_TABLET | Freq: Every day | ORAL | 3 refills | Status: DC

## 2019-09-14 MED ORDER — NITROGLYCERIN 0.4 MG SL SUBL: 0.4000 mg | SUBLINGUAL_TABLET | SUBLINGUAL | 2 refills | Status: DC | PRN

## 2019-09-14 MED ORDER — CLOPIDOGREL BISULFATE 75 MG PO TABS: 75.0000 mg | ORAL_TABLET | Freq: Every day | ORAL | 3 refills | Status: DC

## 2019-09-14 MED ORDER — SPIRONOLACTONE 25 MG PO TABS: 12.5000 mg | ORAL_TABLET | Freq: Every day | ORAL | 1 refills | Status: DC

## 2019-09-14 MED ORDER — CARVEDILOL 12.5 MG PO TABS: 12.5000 mg | ORAL_TABLET | Freq: Two times a day (BID) | ORAL | 3 refills | Status: DC

## 2019-09-14 NOTE — Progress Notes (Signed)
Cardiology Office Note:    Date:  09/14/2019   ID:  Colton [name]., DOB September 09, 1955, MRN [id number]  PCP:  Joaquim Nam, MD  Cardiologist:  Armanda Magic, MD    Referring MD: Joaquim Nam, MD   Chief Complaint  Patient presents with  . Coronary Artery Disease  . Cardiomyopathy  . Hyperlipidemia    History of Present Illness:    Colton [name]. is a 64 y.o. male with a hx of CVA 10/2015 treated with IV TPA, moderateLV dysfunction with EF 35-40%,ASCAD withcath 9/12/17showing80% mid CFX lesion, treated with a DES, and a 60% LAD lesion, not significant by FFR. The pt declined high dose statin Rx. Repeat 2D echo 04/10/2016 showed EF 35-40% with diffuse HK.Cardiac MRI showed EF 42% with HK of the mid anterior wall and late gad uptake in the endocardium of the basal and mid anterior walls c/w prior non-transmural infarct.  He is here today for followup and is doing well.  He has chronic DOE that is very stable.  He denies any anginal chest pain or pressure, PND, orthopnea, LE edema, dizziness (except when going from sitting to standing) or syncope. Occasionally he will feel his heart skip. He has been complaining of a lot of fatigue recently.  He is compliant with his meds and is tolerating meds with no SE.    Past Medical History:  Diagnosis Date  . Coronary artery disease    cath 01/2016 with 60% LAD with FFR 0.82 and 80% mid LCX s/p PCI  . Diabetes mellitus without complication (HCC)   . Dilated cardiomyopathy (HCC)    EF 35-40% by echo  . Discoid lupus   . Fatty liver   . Former smoker   . Gout 07/2001   [facility]  . History of gout   . Hyperlipidemia LDL goal <70   . Hypertension   . Rosacea   . Stroke [facility])    s/p tpa 2017 w/o residual defecit    Past Surgical History:  Procedure Laterality Date  . CARDIAC CATHETERIZATION N/A 02/06/2016   Procedure: Left Heart Cath and Coronary Angiography;  Surgeon: Corky Crafts, MD;  Location: [facility]  INVASIVE CV LAB;  Service: Cardiovascular;  Laterality: N/A;  . CARDIAC CATHETERIZATION N/A 02/06/2016   Procedure: Coronary Stent Intervention;  Surgeon: Corky Crafts, MD;  Location: [facility] INVASIVE CV LAB;  Service: Cardiovascular;  Laterality: N/A;  . CARDIAC CATHETERIZATION N/A 02/06/2016   Procedure: Intravascular Pressure Wire/FFR Study;  Surgeon: Corky Crafts, MD;  Location: [facility] INVASIVE CV LAB;  Service: Cardiovascular;  Laterality: N/A;  . Cardiolite  08/16/2008   [facility]    Current Medications: Current Meds  Medication Sig  . allopurinol (ZYLOPRIM) 100 MG tablet Take 200 mg by mouth daily.  [name] aspirin 81 MG chewable tablet Chew 1 tablet (81 mg total) by mouth daily.  [name] atorvastatin (LIPITOR) 20 MG tablet TAKE ONE TABLET BY MOUTH DAILY  . azithromycin (ZITHROMAX) 250 MG tablet Take two tablets on day one followed by one tablet on days 2-5  . carvedilol (COREG) 12.5 MG tablet TAKE ONE TABLET BY MOUTH TWICE A DAY  . clopidogrel (PLAVIX) 75 MG tablet TAKE ONE TABLET BY MOUTH DAILY  . colchicine 0.6 MG tablet TAKE 1 TABLET BY MOUTH DAILY AS NEEDED FOR GOUT  . nitroGLYCERIN (NITROSTAT) 0.4 MG SL tablet Place 1 tablet (0.4 mg total) under the tongue every 5 (five) minutes as needed for chest pain.  [name] omega-3 acid  ethyl esters (LOVAZA) 1 g capsule TAKE TWO CAPSULES BY MOUTH TWICE A DAY  . sacubitril-valsartan (ENTRESTO) 97-103 MG Take 1 tablet by mouth 2 (two) times daily.  [name] spironolactone (ALDACTONE) 25 MG tablet TAKE 1/2 TABLET BY MOUTH DAILY  . Tetrahydrozoline HCl (VISINE OP) Place 1 drop into both eyes as needed (for dry eyes).     Allergies:   Corticosteroids, Iodine, and Latex   Social History   Socioeconomic History  . Marital status: Widowed    Spouse name: Not on file  . Number of children: 0  . Years of education: Not on file  . Highest education level: Not on file  Occupational History  . 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 Smoking status: Former Smoker    Packs/day: 2.00    Years: 45.00    Pack years: 90.00    Types: Cigarettes    Quit date: 10/28/2015    Years since quitting: 3.8  . Smokeless tobacco: Never Used  Substance and Sexual Activity  . Alcohol use: Yes    Alcohol/week: 2.0 standard drinks    Types: 1 Glasses of wine, 1 Shots of liquor per week  . Drug use: No  . Sexual activity: Not Currently  Other Topics Concern  . Not on file  Social History Narrative   [employer] '78-'79   Prev investment banking, now trades crude Programme researcher, broadcasting/film/video   Widowed, wife died of mesothelioma   No kids   Social Determinants of Radio broadcast assistant Strain:   . Difficulty of Paying Living Expenses:   Food Insecurity:   . Worried About Charity fundraiser in the Last Year:   . Arboriculturist in the Last Year:   Transportation Needs:   . Film/video editor (Medical):   [name] Lack of Transportation (Non-Medical):   Physical Activity:   . Days of Exercise per Week:   . Minutes of Exercise per Session:   Stress:   . Feeling of Stress :   Social Connections:   . Frequency of Communication with Friends and Family:   . Frequency of Social Gatherings with Friends and Family:   . Attends Religious Services:   . Active Member of Clubs or Organizations:   . Attends Archivist Meetings:   [name] Marital Status:      Family History: The patient's family history includes CAD in his father and another family member; Heart disease in his father and another family member; Lung cancer in an other family member. There is no history of Colon cancer or Prostate cancer.  ROS:   Please see the history of present illness.    ROS  All other systems reviewed and negative.   EKGs/Labs/Other Studies Reviewed:    The following studies were reviewed today: none  EKG:  EKG is  ordered today.  The ekg ordered today demonstrates NSR with PACs and no ST changes  Recent Labs: 05/03/2019: BUN 16;  Creatinine, Ser 1.25; Potassium 4.7; Sodium 139   Recent Lipid Panel    Component Value Date/Time   CHOL 131 07/03/2018 0946   TRIG 168 (H) 07/03/2018 0946   HDL 32 (L) 07/03/2018 0946   CHOLHDL 4.1 07/03/2018 0946   CHOLHDL 5.8 (H) 04/10/2016 0917   VLDL 75 (H) 04/10/2016 0917   LDLCALC 65 07/03/2018 0946   LDLDIRECT 104.4 05/12/2008 1304    Physical Exam:    VS:  BP 106/64   Pulse 71   Ht 6' (  1.829 m)   Wt (!) 305 lb 9.6 oz (138.6 kg)   BMI 41.45 kg/m     Wt Readings from Last 3 Encounters:  09/14/19 (!) 305 lb 9.6 oz (138.6 kg)  08/04/19 300 lb (136.1 kg)  05/07/19 295 lb 1 oz (133.8 kg)     GEN:  Well nourished, well developed in no acute distress HEENT: Normal NECK: No JVD; No carotid bruits LYMPHATICS: No lymphadenopathy CARDIAC: RRR, no murmurs, rubs, gallops RESPIRATORY:  Clear to auscultation without rales, wheezing or rhonchi  ABDOMEN: Soft, non-tender, non-distended MUSCULOSKELETAL:  No edema; No deformity  SKIN: Warm and dry NEUROLOGIC:  Alert and oriented x 3 PSYCHIATRIC:  Normal affect   ASSESSMENT:    1. DCM (dilated cardiomyopathy) (HCC)   2. Coronary artery disease involving native coronary artery of native heart without angina pectoris   3. Essential hypertension   4. Hyperlipidemia with target LDL less than 70   5. Morbid obesity (HCC)   6. Other fatigue    PLAN:    In order of problems listed above:  1. DCM - EF 35-40% by echo 2017 and 42% by cardiac MRI 05/2016.  -This is likely mixed ischemic/nonischemic.  - he does not appear volume overloaded on exam today - continue Carvedilol 12.5mg  BID, Entresto 49-51mg  BID and spiro 12.5mg  daily  -creatinine stable at 1.25 and K+ 4.7 on outside labs from PCP Dec 2020  2. ASCAD - cath 9/12/17showing80% mid CFX lesion, treated with a DES, and a 60% LAD lesion, not significant by FFR. - he denies any anginal sx since I saw him last - continue ASA, Plavix, statin and BB  3 .  HTN -BP controlled on exam today -continue Carvedilol, spiro and Entresto  4. Hyperlipidemia -LDL goal <70.  -check FLP and ALT -continue atorvastatin 20mg  daily  5.  Morbid Obesity I have encouraged him to get into a routine exercise program and cut back on carbs and portions.   6.  Fatigue -unclear etiology -may be related to deconditioning -check CBC, TSH    Medication Adjustments/Labs and Tests Ordered: Current medicines are reviewed at length with the patient today.  Concerns regarding medicines are outlined above.  Orders Placed This Encounter  Procedures  . EKG 12-Lead   No orders of the defined types were placed in this encounter.   Signed, , MD  09/14/2019 8:28 AM    [facility]

## 2019-09-14 NOTE — Patient Instructions (Signed)
Medication Instructions:  Your physician recommends that you continue on your current medications as directed. Please refer to the Current Medication list given to you today.  *If you need a refill on your cardiac medications before your next appointment, please call your pharmacy*   Lab Work: Fasting lipids, ALT, CBC, TSH   If you have labs (blood work) drawn today and your tests are completely normal, you will receive your results only by: [name] MyChart Message (if you have MyChart) OR . A paper copy in the mail If you have any lab test that is abnormal or we need to change your treatment, we will call you to review the results.   Follow-Up: At [facility], you and your health needs are our priority.  As part of our continuing mission to provide you with exceptional heart care, we have created designated Provider Care Teams.  These Care Teams include your primary Cardiologist (physician) and Advanced Practice Providers (APPs -  Physician Assistants and Nurse Practitioners) who all work together to provide you with the care you need, when you need it.  We recommend signing up for the patient portal called "MyChart".  Sign up information is provided on this After Visit Summary.  MyChart is used to connect with patients for Virtual Visits (Telemedicine).  Patients are able to view lab/test results, encounter notes, upcoming appointments, etc.  Non-urgent messages can be sent to your provider as well.   To learn more about what you can do with MyChart, go to [email or web address].    Your next appointment:   1 year(s)  The format for your next appointment:   In Person  Provider:   You may see Armanda Magic, MD or one of the following Advanced Practice Providers on your designated Care Team:    Ronie Spies, PA-C  Jacolyn Reedy, PA-C

## 2019-09-14 NOTE — Addendum Note (Signed)
Addended by: Theresia Majors on: 09/14/2019 08:40 AM   Modules accepted: Orders

## 2019-09-17 ENCOUNTER — Other Ambulatory Visit: Payer: Self-pay | Admitting: *Deleted

## 2019-09-17 ENCOUNTER — Other Ambulatory Visit: Payer: Self-pay

## 2019-09-17 DIAGNOSIS — E785 Hyperlipidemia, unspecified: Secondary | ICD-10-CM

## 2019-09-17 DIAGNOSIS — R5383 Other fatigue: Secondary | ICD-10-CM

## 2019-09-17 DIAGNOSIS — I10 Essential (primary) hypertension: Secondary | ICD-10-CM

## 2019-09-18 LAB — LIPID PANEL
Chol/HDL Ratio: 5 ratio (ref 0.0–5.0)
Cholesterol, Total: 140 mg/dL (ref 100–199)
HDL: 28 mg/dL — ABNORMAL LOW (ref >39)
LDL Chol Calc (NIH): 49 mg/dL (ref 0–99)
Triglycerides: 426 mg/dL — ABNORMAL HIGH (ref 0–149)
VLDL Cholesterol Cal: 63 mg/dL — ABNORMAL HIGH (ref 5–40)

## 2019-09-18 LAB — CBC
Hematocrit: 42.1 % (ref 37.5–51.0)
Hemoglobin: 14.1 g/dL (ref 13.0–17.7)
MCH: 30.3 pg (ref 26.6–33.0)
MCHC: 33.5 g/dL (ref 31.5–35.7)
MCV: 91 fL (ref 79–97)
Platelets: 117 10*3/uL — ABNORMAL LOW (ref 150–450)
RBC: 4.65 x10E6/uL (ref 4.14–5.80)
RDW: 13.2 % (ref 11.6–15.4)
WBC: 8.8 10*3/uL (ref 3.4–10.8)

## 2019-09-18 LAB — TSH: TSH: 3.48 u[IU]/mL (ref 0.450–4.500)

## 2019-09-18 LAB — ALT: ALT: 23 IU/L (ref 0–44)

## 2019-09-19 ENCOUNTER — Encounter: Payer: Self-pay | Admitting: Family Medicine

## 2019-09-19 DIAGNOSIS — D696 Thrombocytopenia, unspecified: Secondary | ICD-10-CM | POA: Insufficient documentation

## 2019-10-26 ENCOUNTER — Telehealth: Payer: Self-pay

## 2019-10-26 NOTE — Telephone Encounter (Signed)
Patient contacted the office and states that he was seen on 08/04/19 by Dr. Reece Agar for a sinus infection and L ear pain. Patient states that he was prescribed a z-pak, which did help, but he is still experiencing L ear pain, and & ringing/pressure in the ear.  Patient is wondering if Dr. Para March can send in another z-pak for him, or if he needs to be seen. I did advise patient that an in office evaluation would be best, but patient asked that I send a message for Dr. Para March just to see if he would send in a medication for him?  Please advise.

## 2019-10-26 NOTE — Telephone Encounter (Signed)
Please triage him.  Advise recheck.  Thanks.

## 2019-10-26 NOTE — Telephone Encounter (Signed)
Spoke to patient and was advised that he is having the exact same symptoms with his left ear that he had back in March. Patient stated that he is trying to avoid coming into a medical office at this time being around sick people. Advised patient that we screen patients for covid before bringing them into the office. After speaking to Dr. Para March patient was advised that he would need to be seen in order for the doctor to look into his ear. Patient scheduled to see Dr. Sharen Hones tomorrow 10/27/19 for ear pain.

## 2019-10-26 NOTE — Telephone Encounter (Signed)
Noted. Thanks.

## 2019-10-27 ENCOUNTER — Ambulatory Visit (INDEPENDENT_AMBULATORY_CARE_PROVIDER_SITE_OTHER): Payer: Self-pay | Admitting: Family Medicine

## 2019-10-27 ENCOUNTER — Other Ambulatory Visit: Payer: Self-pay

## 2019-10-27 ENCOUNTER — Encounter: Payer: Self-pay | Admitting: Family Medicine

## 2019-10-27 VITALS — BP 118/64 | HR 68 | Temp 98.2°F | Ht 72.0 in | Wt 299.2 lb

## 2019-10-27 DIAGNOSIS — J0100 Acute maxillary sinusitis, unspecified: Secondary | ICD-10-CM

## 2019-10-27 MED ORDER — AZITHROMYCIN 250 MG PO TABS: ORAL_TABLET | ORAL | 0 refills | Status: DC

## 2019-10-27 NOTE — Progress Notes (Signed)
This visit was conducted in person.  BP 118/64 (BP Location: Left Arm, Patient Position: Sitting, Cuff Size: Large)   Pulse 68   Temp 98.2 F (36.8 C) (Temporal)   Ht 6' (1.829 m)   Wt 299 lb 3 oz (135.7 kg)   SpO2 98%   BMI 40.58 kg/m    CC: L ear pain Subjective:    Patient ID: Colton Vespa., male    DOB: [birth year], 64 y.o.   MRN: [id number]  HPI: Colton Mcquigg. is a 64 y.o. male presenting on 10/27/2019 for Ear Pain (C/o left ear pain.  Started last wk.  Had same sxs in 07/2019.  Abx tx helped. )   1 wk h/o L earache associated with ST, L maxillary pain, L neck pain with swollen gland. PNDrainage.   No cough, congestion, nausea, abd pain, diarrhea, dyspnea or wheeze.  No sick contacts.  Bad allergy season - one of the worst he's experienced this year. Doesn't take anything for allergies. Limits OTC remedies.   Has not received COVID vaccine yet. Wants to wait for new vaccine.  No h/o asthma.  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   Similar symptoms 07/2019 treated with zpack with benefit.      Relevant past medical, surgical, family and social history reviewed and updated as indicated. Interim medical history since our last visit reviewed. Allergies and medications reviewed and updated. Outpatient Medications Prior to Visit  Medication Sig Dispense Refill  . allopurinol (ZYLOPRIM) 100 MG tablet Take 200 mg by mouth daily.    [name] aspirin 81 MG chewable tablet Chew 1 tablet (81 mg total) by mouth daily. 90 tablet 3  . atorvastatin (LIPITOR) 20 MG tablet Take 1 tablet (20 mg total) by mouth daily. 90 tablet 3  . carvedilol (COREG) 12.5 MG tablet Take 1 tablet (12.5 mg total) by mouth 2 (two) times daily. 180 tablet 3  . clopidogrel (PLAVIX) 75 MG tablet Take 1 tablet (75 mg total) by mouth daily. 90 tablet 3  . colchicine 0.6 MG tablet TAKE 1 TABLET BY MOUTH DAILY AS NEEDED FOR GOUT 30 tablet 0  . nitroGLYCERIN (NITROSTAT) 0.4 MG SL tablet Place 1 tablet (0.4 mg total)  under the tongue every 5 (five) minutes as needed for chest pain. 25 tablet 2  . omega-3 acid ethyl esters (LOVAZA) 1 g capsule TAKE TWO CAPSULES BY MOUTH TWICE A DAY 360 capsule 1  . sacubitril-valsartan (ENTRESTO) 97-103 MG Take 1 tablet by mouth 2 (two) times daily. 180 tablet 3  . spironolactone (ALDACTONE) 25 MG tablet Take 0.5 tablets (12.5 mg total) by mouth daily. 45 tablet 1  . Tetrahydrozoline HCl (VISINE OP) Place 1 drop into both eyes as needed (for dry eyes).    [name] azithromycin (ZITHROMAX) 250 MG tablet Take two tablets on day one followed by one tablet on days 2-5 6 each 0   No facility-administered medications prior to visit.     Per HPI unless specifically indicated in ROS section below Review of Systems Objective:  BP 118/64 (BP Location: Left Arm, Patient Position: Sitting, Cuff Size: Large)   Pulse 68   Temp 98.2 F (36.8 C) (Temporal)   Ht 6' (1.829 m)   Wt 299 lb 3 oz (135.7 kg)   SpO2 98%   BMI 40.58 kg/m   Wt Readings from Last 3 Encounters:  10/27/19 299 lb 3 oz (135.7 kg)  09/14/19 (!) 305 lb 9.6 oz (138.6 kg)  08/04/19 300 lb (136.1  kg)      Physical Exam Vitals and nursing note reviewed.  Constitutional:      General: He is not in acute distress.    Appearance: Normal appearance. He is well-developed. He is obese. He is not ill-appearing.  HENT:     Head: Normocephalic and atraumatic.     Right Ear: Hearing, tympanic membrane, ear canal and external ear normal.     Left Ear: Hearing, tympanic membrane, ear canal and external ear normal.     Nose: Mucosal edema present. No rhinorrhea.     Right Turbinates: Not enlarged.     Left Turbinates: Not enlarged.     Right Sinus: No maxillary sinus tenderness or frontal sinus tenderness.     Left Sinus: Maxillary sinus tenderness present. No frontal sinus tenderness.     Mouth/Throat:     Mouth: Mucous membranes are moist.     Pharynx: Oropharynx is clear. Uvula midline. Posterior oropharyngeal erythema  (posterior oropharyngeal) present. No oropharyngeal exudate.     Tonsils: No tonsillar abscesses.  Eyes:     General: No scleral icterus.    Extraocular Movements: Extraocular movements intact.     Conjunctiva/sclera: Conjunctivae normal.     Pupils: Pupils are equal, round, and reactive to light.  Neck:     Comments: Tender at L neck without obvious LAD Cardiovascular:     Rate and Rhythm: Normal rate and regular rhythm.     Pulses: Normal pulses.     Heart sounds: Normal heart sounds. No murmur.  Pulmonary:     Effort: Pulmonary effort is normal. No respiratory distress.     Breath sounds: Normal breath sounds. No wheezing, rhonchi or rales.     Comments: Crackles bibasilarly Musculoskeletal:     Cervical back: Normal range of motion and neck supple.  Lymphadenopathy:     Cervical: No cervical adenopathy.  Skin:    General: Skin is warm and dry.     Findings: No rash.  Neurological:     Mental Status: He is alert.       Assessment & Plan:  This visit occurred during the SARS-CoV-2 public health emergency.  Safety protocols were in place, including screening questions prior to the visit, additional usage of staff PPE, and extensive cleaning of exam room while observing appropriate contact time as indicated for disinfecting solutions.   Problem List Items Addressed This Visit    Acute sinusitis - Primary    Anticipate repeat acute sinusitis ongoing 1+ wk. No signs of acute bacterial ear infection, anticipate L ear symptoms from ETD. Discussed supportive care as per instructions - fluids, rest, mucinex, and antihistamine. WASP for azithromycin printed with indications when to fill. Update if not improving with treatment. Consider flonase if not improving with above.       Relevant Medications   azithromycin (ZITHROMAX) 250 MG tablet       Meds ordered this encounter  Medications  . azithromycin (ZITHROMAX) 250 MG tablet    Sig: Take two tablets on day one followed by one  tablet on days 2-5    Dispense:  6 each    Refill:  0   No orders of the defined types were placed in this encounter.   Patient Instructions  You likely have a sinus infection, viral vs bacterial.  Take medicine as prescribed: azithromycin antibiotic for bacterial sinusitis.  Push fluids and plenty of rest. Start antihistamine like claritin or zyrtec for allergies which contribute to inflammation.  Nasal saline irrigation  or neti pot to help drain sinuses. May use plain mucinex (guafenesin) with plenty of fluid to help mobilize mucous. Let us know if fever >101.5, trouble opening/closing mouth, difficulty swallowing, or worsening instead of improving as expected.  No colchicine while on azithromycin.    Follow up plan: Return if symptoms worsen or fail to improve.  Ria Bush, MD

## 2019-10-27 NOTE — Assessment & Plan Note (Signed)
Anticipate repeat acute sinusitis ongoing 1+ wk. No signs of acute bacterial ear infection, anticipate L ear symptoms from ETD. Discussed supportive care as per instructions - fluids, rest, mucinex, and antihistamine. WASP for azithromycin printed with indications when to fill. Update if not improving with treatment. Consider flonase if not improving with above.

## 2019-10-27 NOTE — Patient Instructions (Addendum)
You likely have a sinus infection, viral vs bacterial.  Take medicine as prescribed: azithromycin antibiotic for bacterial sinusitis.  Push fluids and plenty of rest. Start antihistamine like claritin or zyrtec for allergies which contribute to inflammation.  Nasal saline irrigation or neti pot to help drain sinuses. May use plain mucinex (guafenesin) with plenty of fluid to help mobilize mucous. Let us know if fever >101.5, trouble opening/closing mouth, difficulty swallowing, or worsening instead of improving as expected.  No colchicine while on azithromycin.

## 2019-12-06 ENCOUNTER — Encounter: Payer: Self-pay | Admitting: Family Medicine

## 2019-12-06 ENCOUNTER — Ambulatory Visit (INDEPENDENT_AMBULATORY_CARE_PROVIDER_SITE_OTHER): Payer: Self-pay | Admitting: Family Medicine

## 2019-12-06 ENCOUNTER — Other Ambulatory Visit: Payer: Self-pay

## 2019-12-06 VITALS — BP 128/82 | HR 82 | Temp 98.3°F | Ht 72.0 in | Wt 298.3 lb

## 2019-12-06 DIAGNOSIS — E119 Type 2 diabetes mellitus without complications: Secondary | ICD-10-CM

## 2019-12-06 DIAGNOSIS — J029 Acute pharyngitis, unspecified: Secondary | ICD-10-CM

## 2019-12-06 DIAGNOSIS — Z7184 Encounter for health counseling related to travel: Secondary | ICD-10-CM

## 2019-12-06 MED ORDER — AZITHROMYCIN 250 MG PO TABS: ORAL_TABLET | ORAL | 0 refills | Status: DC

## 2019-12-06 NOTE — Patient Instructions (Signed)
Don't take colchicine with zithromax.  Go to the lab on the way out.   If you have mychart we'll likely use that to update you.    We'll call about seeing ENT.  Take care.  Glad to see you.

## 2019-12-06 NOTE — Progress Notes (Signed)
This visit occurred during the SARS-CoV-2 public health emergency.  Safety protocols were in place, including screening questions prior to the visit, additional usage of staff PPE, and extensive cleaning of exam room while observing appropriate contact time as indicated for disinfecting solutions.  Overall rare use of colchicine.  Discussed.  He was able to tolerate steroid course after dental procedure.    L ear pain, some pain on the L occiput.  Can feel a lump on the L side of the neck.  Irritated throat, thought he saw a white spot on his throat.  He was recently on amoxil for dental procedure last week.  He can feel like he has a fever but will check his temp and it isn't elevated.  Fatigued.  No vomiting, no cough.  Similar sx to 1 month ago.  No known covid exposure.    History of diabetes.  Asking about weight loss options.  See notes on labs/follow-up phone note.  Traveling to [place].  I told him I wanted to check on options.  He will be gone for 2 weeks.  Traveling in September.  See follow-up phone note.  R foot with lateral 1st nail changes.  Discussed options.  Would be hesitant to treat with systemic antifungals.  He agreed.  Meds, vitals, and allergies reviewed.   ROS: Per HPI unless specifically indicated in ROS section   nad ncat TM wnl B, no erythema OP without ulceration. L sided hard and soft palate irritation noted. Neck supple.  Single inflamed lymph node in the anterior chain noted. rrr ctab Right foot with lateral first nail changes.

## 2019-12-07 LAB — BASIC METABOLIC PANEL
BUN: 18 mg/dL (ref 6–23)
CO2: 26 mEq/L (ref 19–32)
Calcium: 9.2 mg/dL (ref 8.4–10.5)
Chloride: 104 mEq/L (ref 96–112)
Creatinine, Ser: 1.32 mg/dL (ref 0.40–1.50)
GFR: 54.49 mL/min — ABNORMAL LOW (ref >60.00)
Glucose, Bld: 124 mg/dL — ABNORMAL HIGH (ref 70–99)
Potassium: 4.7 mEq/L (ref 3.5–5.1)
Sodium: 137 mEq/L (ref 135–145)

## 2019-12-07 LAB — HEMOGLOBIN A1C: Hgb A1c MFr Bld: 6.8 % — ABNORMAL HIGH (ref 4.6–6.5)

## 2019-12-07 LAB — MONONUCLEOSIS SCREEN: Mono Screen: NEGATIVE

## 2019-12-08 ENCOUNTER — Telehealth: Payer: Self-pay | Admitting: Family Medicine

## 2019-12-08 DIAGNOSIS — E119 Type 2 diabetes mellitus without complications: Secondary | ICD-10-CM

## 2019-12-08 LAB — CULTURE, GROUP A STREP
MICRO NUMBER:: 10693116
SPECIMEN QUALITY:: ADEQUATE

## 2019-12-08 MED ORDER — DOXYCYCLINE HYCLATE 100 MG PO TABS
100.0000 mg | ORAL_TABLET | Freq: Every day | ORAL | 0 refills | Status: DC
Start: 2019-12-08 — End: 2020-08-15

## 2019-12-08 NOTE — Telephone Encounter (Signed)
See notes on labs.  He is traveling to [place] in the fall.  He will be gone for 2 weeks.  Reasonable to take doxycycline.100 mg daily; initiate 1 to 2 days prior to travel to endemic area; continue daily during travel and for 4 weeks after leaving endemic area.  Sunburn caution on medication.  If he has not had hepatitis A and B vaccine he should get that done before he leaves the country.  He can get that done here.  If he has not had typhoid vaccination he should get that done before he leaves the country.  I can send a prescription for that.  It is an oral vaccine.  He should check with cardiology about Covid vaccination.    I sent the Doxy prescription.    He was asking about referral for weight loss.  I do not think he needs medication for weight loss at this point.  I think he needs continued work on diet and exercise.  We can refer him to the weight loss clinic to discuss diet and exercise if he prefers.    Please update me as needed about the other issues above.

## 2019-12-09 ENCOUNTER — Telehealth: Payer: Self-pay

## 2019-12-09 NOTE — Telephone Encounter (Signed)
[facility] Night - Client TELEPHONE ADVICE RECORD AccessNurse Patient Name: Colton [name] Gender: Male DOB: Oct 14, 1955 Age: 64 Y 7 M 26 D Return Phone Number: [phone] (Primary) Address: City/State/Zip: [place] [address] Client [facility] Night - Client Client Site [facility] - Night Physician Raechel Ache - MD Contact Type Call Who Is Calling Patient / Member / Family / Caregiver Call Type Triage / Clinical Relationship To Patient Self Return Phone Number [phone] (Primary) Chief Complaint WALKING (<12 years) - inability to walk Reason for Call Symptomatic / Request for Health Information Initial Comment Caller reports they prescribed azithromycin. Gout foot and toe pain has worsen collcine but pt is concerned about taking both at same time. Translation No Nurse Assessment Nurse: Darcus Austin, RN, Sarah Date/Time (Eastern Time): 12/08/2019 9:05:14 PM Confirm and document reason for call. If symptomatic, describe symptoms. ---Caller states he was put on Azithromycin 2 days ago for a "gland problem." States today after lunch he is having a severe flare up of gout in foot and toe. Wanted to see if he is allowed to take Colchicine. Has the patient had close contact with a person known or suspected to have the novel coronavirus illness OR traveled / lives in area with major community spread (including international travel) in the last 14 days from the onset of symptoms? * If Asymptomatic, screen for exposure and travel within the last 14 days. ---No Does the patient have any new or worsening symptoms? ---Yes Will a triage be completed? ---Yes Related visit to physician within the last 2 weeks? ---No Does the PT have any chronic conditions? (i.e. diabetes, asthma, this includes High risk factors for pregnancy, etc.) ---Yes List chronic conditions. ---Gout, HTN, Is this a behavioral health or substance  abuse call? ---No Guidelines Guideline Title Affirmed Question Affirmed Notes Nurse Date/Time Lamount Cohen Time) Foot Pain [1] SEVERE pain (e.g., excruciating, unable to do any normal activities) AND [2] not improved Goins, RN, Maralyn Sago 12/08/2019 9:09:06 PM PLEASE NOTE: All timestamps contained within this report are represented as [place] Standard Time. CONFIDENTIALTY NOTICE: This fax transmission is intended only for the addressee. It contains information that is legally privileged, confidential or otherwise protected from use or disclosure. If you are not the intended recipient, you are strictly prohibited from reviewing, disclosing, copying using or disseminating any of this information or taking any action in reliance on or regarding this information. If you have received this fax in error, please notify us immediately by telephone so that we can arrange for its return to [place]. Phone: [phone], Toll-Free: [phone], Fax: [phone] Page: 2 of 2 Call Id: [id number] Guidelines Guideline Title Affirmed Question Affirmed Notes Nurse Date/Time Lamount Cohen Time) after 2 hours of pain medicine Disp. Time Lamount Cohen Time) Disposition Final User 12/08/2019 9:04:21 PM Send to Urgent Loleta Rose, Edden 12/08/2019 9:13:31 PM Called On-Call Provider Goins, RN, Sarah 12/08/2019 9:09:43 PM See HCP within 4 Hours (or PCP triage) Yes Goins, RN, Karren Cobble Disagree/Comply Comply Caller Understands Yes PreDisposition Call Doctor Care Advice Given Per Guideline SEE HCP WITHIN 4 HOURS (OR PCP TRIAGE): * IF OFFICE WILL BE CLOSED AND NO PCP (PRIMARY CARE PROVIDER) SECOND-LEVEL TRIAGE: You need to be seen within the next 3 or 4 hours. A nearby Urgent Care Center [facility]) is often a good source of care. Another choice is to go to the ED. Go sooner if you become worse. PAIN MEDICINES: * IBUPROFEN (E.G., MOTRIN, ADVIL): Take 400 mg (two 200  mg pills) by mouth every 6 hours. The most you should take each day is  1,200 mg (six 200 mg pills), unless your doctor has told you to take more. CALL BACK IF: * You become worse. CARE ADVICE given per Foot Pain (Adult) guideline. Comments User: Diamantina Monks, RN Date/Time Lamount Cohen Time): 12/08/2019 9:11:35 PM [email or web address]://[email or web address].com/interactions-check.php?drug_[email or web address] Referrals GO TO FACILITY UNDECIDED Paging DoctorName Phone DateTime Result/Outcome Message Type Notes Elizabeth Palau- MD [phone] 12/08/2019 9:13:31 PM Called On Call Provider - Reached Doctor Paged Elizabeth Palau- MD 12/08/2019 9:14:13 PM Spoke with On Call - General Message Result Spoke with on-call. States if patient is only going to use the colchicine short term for gout flare up it should be fine. If he is needing more than a day or two will need further advise or to only take medication BID.

## 2019-12-09 NOTE — Assessment & Plan Note (Signed)
Asking about weight loss options.  See notes on labs/follow-up phone note.

## 2019-12-09 NOTE — Assessment & Plan Note (Signed)
Recurrent.  Check Monospot and strep culture.  See notes on labs.  Restart azithromycin since he had apparently responded to that previously.  Refer to ENT.

## 2019-12-09 NOTE — Telephone Encounter (Signed)
Advised pt. Pt reports he had Hep A & B approximately 20 years ago. Advised a msg would be sent to Dr. Para March to see if he thought he would need any additional vaccines.  He will discuss if he should have typhoid vaccine with his cardiologist on Monday at his apt.   Cardiology said he should not have the covid vaccine.   Pt would like a referral to diet and exercise clinic.   Pt verbalized understanding.

## 2019-12-09 NOTE — Assessment & Plan Note (Signed)
Traveling to [place].  I told him I wanted to check on options.  He will be gone for 2 weeks.  Traveling in September.  See follow-up phone note.  At least 30 minutes were devoted to patient care in this encounter (this can potentially include time spent reviewing the patient's file/history, interviewing and examining the patient, counseling/reviewing plan with patient, ordering referrals, ordering tests, reviewing relevant laboratory or x-ray data, and documenting the encounter).

## 2019-12-10 ENCOUNTER — Ambulatory Visit: Payer: Self-pay | Admitting: Family Medicine

## 2019-12-10 NOTE — Telephone Encounter (Signed)
Noted. Thanks.

## 2019-12-10 NOTE — Telephone Encounter (Signed)
Note made on patient sheet for OV as reminder.

## 2019-12-10 NOTE — Telephone Encounter (Addendum)
Vaccine list updated.  He should not have to repeat hep a and B.  I put in the referral.  Patient has office visit scheduled for today.  Please update patient on check-in or remind me and I can tell him.  Thanks.

## 2019-12-10 NOTE — Addendum Note (Signed)
Addended by: Joaquim Nam on: 12/10/2019 06:56 AM   Modules accepted: Orders

## 2019-12-13 ENCOUNTER — Encounter: Payer: Self-pay | Admitting: Family Medicine

## 2019-12-13 ENCOUNTER — Ambulatory Visit: Payer: Self-pay | Admitting: Family Medicine

## 2019-12-13 ENCOUNTER — Other Ambulatory Visit: Payer: Self-pay

## 2019-12-13 ENCOUNTER — Ambulatory Visit: Payer: Self-pay | Admitting: Cardiology

## 2019-12-13 VITALS — BP 102/60 | HR 82 | Temp 97.2°F | Ht 72.0 in | Wt 296.2 lb

## 2019-12-13 DIAGNOSIS — J029 Acute pharyngitis, unspecified: Secondary | ICD-10-CM

## 2019-12-13 DIAGNOSIS — M109 Gout, unspecified: Secondary | ICD-10-CM

## 2019-12-13 DIAGNOSIS — E119 Type 2 diabetes mellitus without complications: Secondary | ICD-10-CM

## 2019-12-13 DIAGNOSIS — Z7184 Encounter for health counseling related to travel: Secondary | ICD-10-CM

## 2019-12-13 MED ORDER — COLCHICINE 0.6 MG PO TABS: ORAL_TABLET | ORAL | 1 refills | Status: DC

## 2019-12-13 MED ORDER — PREDNISONE 10 MG PO TABS
ORAL_TABLET | ORAL | 0 refills | Status: DC
Start: 2019-12-13 — End: 2020-01-24

## 2019-12-13 MED ORDER — ALLOPURINOL 100 MG PO TABS: 100.0000 mg | ORAL_TABLET | Freq: Every day | ORAL | Status: DC

## 2019-12-13 MED ORDER — HYDROCODONE-ACETAMINOPHEN 5-325 MG PO TABS: 1.0000 | ORAL_TABLET | Freq: Four times a day (QID) | ORAL | 0 refills | Status: DC | PRN

## 2019-12-13 NOTE — Patient Instructions (Addendum)
Start with allopurinol 100mg  a day 1 week after all of the gout symptoms are resolved.  Then recheck labs here in about 1 month after that.    If needed, take another dose of colchicine tomorrow.    Use colchicine only if needed.  If you have to take colchicine for more than 5 days, then skip a dose of atorvastatin.   Use prednisone only if colchicine doesn't help, take with food, don't use ibuprofen or aleve, and drink plenty of water.  If the colchicine and prednisone don't work then use hydrocodone and update .    Take care.  Glad to see you.

## 2019-12-13 NOTE — Progress Notes (Signed)
This visit occurred during the SARS-CoV-2 public health emergency.  Safety protocols were in place, including screening questions prior to the visit, additional usage of staff PPE, and extensive cleaning of exam room while observing appropriate contact time as indicated for disinfecting solutions.  Gout follow up.  Prev with severe pain at the L 1st MTP.  Better in the meantime but still tender.  H/o similar in the past but this was worse than prev.  He hasn't been on allopurinol.  D/w pt about not starting allopurinol while in the midst of a flare.  See abv.  He isn't on meds that typically cause gout, d/w pt.  Colchicine helped.    D/w pt about prev vaccine and travel recs, see prev phone note.   Referral prev placed for DM2/weight counseling.    Prev lump on L side of neck resolved.  ST is better, done with zithromax.  I asked him to keep f/u with ENT with prev referral done. D/w pt.    Meds, vitals, and allergies reviewed.   ROS: Per HPI unless specifically indicated in ROS section   GEN: nad, alert and oriented HEENT: ncat NECK: supple w/o LA CV: rrr. PULM: ctab, no inc wob ABD: soft, +bs EXT: no edema SKIN: no acute rash but he does have some mild tenderness and very mild erythema on the left first MTP.  Still able to bear weight.

## 2019-12-15 NOTE — Assessment & Plan Note (Signed)
Start with allopurinol 100mg  a day 1 week after all of the gout symptoms are resolved.  Then recheck labs here in about 1 month after that.  Rationale discussed with patient.  He agreed.  If needed, take another dose of colchicine tomorrow.  Routine colchicine cautions discussed.  Use colchicine only if needed.  If you have to take colchicine for more than 5 days, then skip a dose of atorvastatin.   Use prednisone only if colchicine doesn't help, take with food, don't use ibuprofen or aleve, and drink plenty of water.  If the colchicine and prednisone don't work then use hydrocodone and update .    Prescription given for hydrocodone and prednisone since he will be traveling outside of the country with limited options for intervention at a medical facility otherwise.

## 2019-12-15 NOTE — Assessment & Plan Note (Signed)
See above.  See previous phone note.

## 2019-12-15 NOTE — Assessment & Plan Note (Signed)
Referral placed.  See above. ?

## 2019-12-15 NOTE — Assessment & Plan Note (Signed)
I asked him to follow-up with ENT.  Previous referral placed.

## 2020-01-20 ENCOUNTER — Telehealth: Payer: Self-pay

## 2020-01-20 NOTE — Telephone Encounter (Signed)
Received fax access nurse note that pt ws having blurred vision and dizziness. Do not see triage;and disposition of call at 01/20/20 at 10:33 am was call was cancelled by request. I tried to call pt and left v/m requesting cb to [facility]. Copy of access nurse record sent for scanning and a copy was put in Dr Lianne Bushy in box.

## 2020-01-20 NOTE — Telephone Encounter (Signed)
Please try to get update on patient.  Thanks.  

## 2020-01-20 NOTE — Telephone Encounter (Signed)
I spoke with pt; pt already has 30' appt on 01/24/20 at 12:30; pt will eat early breakfast and will fast 4 hrs prior to appt if wants fasting labs when pt comes in. Pt will drink water or black coffee between 8 - 12:30. Pt said mid July his BS 124 at office. Pt does not have diabetic meter or supplies at home at this point. Right now pt states he feels fine. Starting about 1 wk ago pt has on and off blurred vision and fatigue and jitteriness and nervous ness. Last time pt had these symptoms were earlier today. Pt said he eats a low carb diet with no problem but he has been drinking a lot of apple juice in the last wk or so. I advised pt apple juice has a lot of sugar in it. Pt will stop the apple juice and increase his water intake and drink plenty of water. Pt has no covid symptoms, no travel and no known exposure to + covid. No covid vaccine. Pt is concerned about his BS. Pt said he would prefer to take care of by diet rather than adding more meds to his long med list. Pt will keep appt with Dr Para March on 08/30/21and will stay hydrated with water and stay out of heat and UC precautions given and pt voiced understanding. Pt appreciative of our concern. [employer]. FYI to Dr Para March.

## 2020-01-21 NOTE — Telephone Encounter (Signed)
Noted. Thanks.

## 2020-01-24 ENCOUNTER — Other Ambulatory Visit: Payer: Self-pay

## 2020-01-24 ENCOUNTER — Ambulatory Visit: Payer: Self-pay | Admitting: Family Medicine

## 2020-01-24 ENCOUNTER — Encounter: Payer: Self-pay | Admitting: Family Medicine

## 2020-01-24 VITALS — BP 102/64 | HR 71 | Temp 97.2°F | Ht 72.0 in | Wt 300.6 lb

## 2020-01-24 DIAGNOSIS — E119 Type 2 diabetes mellitus without complications: Secondary | ICD-10-CM

## 2020-01-24 LAB — HEMOGLOBIN A1C: Hgb A1c MFr Bld: 7.3 % — ABNORMAL HIGH (ref 4.6–6.5)

## 2020-01-24 LAB — BASIC METABOLIC PANEL
BUN: 16 mg/dL (ref 6–23)
CO2: 24 mEq/L (ref 19–32)
Calcium: 9.2 mg/dL (ref 8.4–10.5)
Chloride: 103 mEq/L (ref 96–112)
Creatinine, Ser: 1.26 mg/dL (ref 0.40–1.50)
GFR: 57.48 mL/min — ABNORMAL LOW (ref >60.00)
Glucose, Bld: 134 mg/dL — ABNORMAL HIGH (ref 70–99)
Potassium: 4.4 mEq/L (ref 3.5–5.1)
Sodium: 135 mEq/L (ref 135–145)

## 2020-01-24 LAB — C-PEPTIDE: C-Peptide: 10.11 ng/mL — ABNORMAL HIGH (ref 0.80–3.85)

## 2020-01-24 MED ORDER — VITAMIN D (CHOLECALCIFEROL) 25 MCG (1000 UT) PO CAPS: 1000.0000 [IU] | ORAL_CAPSULE | Freq: Every day | ORAL | Status: AC

## 2020-01-24 MED ORDER — PREDNISONE 10 MG PO TABS: ORAL_TABLET | ORAL | 0 refills | Status: DC

## 2020-01-24 NOTE — Progress Notes (Signed)
This visit occurred during the SARS-CoV-2 public health emergency.  Safety protocols were in place, including screening questions prior to the visit, additional usage of staff PPE, and extensive cleaning of exam room while observing appropriate contact time as indicated for disinfecting solutions.  Diabetes:  No meds. He had blurry vision prev in both eyes, concurrent equal and episodic.  Noted after eating, after drinking apple juice.  No vision loss.  After a meal it would take 30 to 60 min to get better if he drank a lot of water.  It could start within a few minutes of a meal.  He was prev drinking apple juice.  All sx are recent, in the last few weeks.  Nocturia x2 at night, stable.  He had been thirsty but that is at baseline over the last few years.  He doesn't have home sugar meter.    Last A1c 6.8.    Recheck pulse 71- likely spurious result on initial check.    Vision 20/20 in the left and right eye with correction.  Meds, vitals, and allergies reviewed.  ROS: Per HPI unless specifically indicated in ROS section   GEN: nad, alert and oriented HEENT: ncat NECK: supple w/o LA CV: rrr. PULM: ctab, no inc wob ABD: soft, +bs EXT: no edema SKIN: no acute rash B limited fundus exam wnl B

## 2020-01-24 NOTE — Patient Instructions (Addendum)
Go to the lab on the way out.   If you have mychart we'll likely use that to update you.    Check to see if you can get a meter.  It may be worthwhile to check sugar before and after a meal.   Avoid high sugar foods and drink plenty of water.   Take care.  Glad to see you.

## 2020-01-26 NOTE — Assessment & Plan Note (Signed)
Noted blurry vision after eating, after drinking apple juice.  No vision loss.  Likely with a relative hyperglycemia that was transiently affecting his vision.  Discussed options.  Would limit high carbohydrate foods in the meantime.  No change in meds at this point.  See notes on labs.  Okay for outpatient follow-up.

## 2020-01-27 ENCOUNTER — Other Ambulatory Visit: Payer: Self-pay

## 2020-01-27 ENCOUNTER — Encounter: Payer: Self-pay | Admitting: Family Medicine

## 2020-01-27 ENCOUNTER — Ambulatory Visit: Payer: Self-pay | Admitting: Family Medicine

## 2020-01-27 VITALS — BP 92/58 | HR 78 | Temp 96.5°F | Ht 72.0 in | Wt 300.6 lb

## 2020-01-27 DIAGNOSIS — I5022 Chronic systolic (congestive) heart failure: Secondary | ICD-10-CM

## 2020-01-27 DIAGNOSIS — E119 Type 2 diabetes mellitus without complications: Secondary | ICD-10-CM

## 2020-01-27 DIAGNOSIS — Z1211 Encounter for screening for malignant neoplasm of colon: Secondary | ICD-10-CM

## 2020-01-27 MED ORDER — SPIRONOLACTONE 25 MG PO TABS: ORAL_TABLET | ORAL | Status: DC

## 2020-01-27 NOTE — Patient Instructions (Addendum)
Hold spironolactone for now and recheck BP with cardiology.  Keep working on diet and recheck labs in about 3 months.   Update me as needed.  Use the eat right diet.  Take care.  Glad to see you. Let me know if you want to get cologuard set up.

## 2020-01-27 NOTE — Progress Notes (Signed)
This visit occurred during the SARS-CoV-2 public health emergency.  Safety protocols were in place, including screening questions prior to the visit, additional usage of staff PPE, and extensive cleaning of exam room while observing appropriate contact time as indicated for disinfecting solutions.  DM2 f/u.  D/w pt about recent labs and sx.  Blurry vision is still coming and going but better than prev (less often and less severe).  He has eye exam pending.  No vision loss.    Lower BP noted.  Not lightheaded.  No CP.  Baseline meds d/w pt.  See med list.    We talked about nutrition referral.  He wanted to defer.  Low sugar handout d/w pt. rationale for diet choices discussed with patient.  D/w patient NO:MVEHMCN for colon cancer screening, including IFOB vs. colonoscopy.  Risks and benefits of both were discussed and patient voiced understanding.  Pt elects to price check cologuard.  I will await his input.  Meds, vitals, and allergies reviewed.   ROS: Per HPI unless specifically indicated in ROS section   GEN: nad, alert and oriented HEENT: ncat NECK: supple w/o LA CV: rrr PULM: ctab, no inc wob ABD: soft, +bs EXT: no edema SKIN: Well-perfused  Diabetic foot exam: Normal inspection No skin breakdown No calluses  Normal DP pulses Normal sensation to light touch and monofilament Nails normal  See after visit summary.

## 2020-01-31 DIAGNOSIS — Z1211 Encounter for screening for malignant neoplasm of colon: Secondary | ICD-10-CM | POA: Insufficient documentation

## 2020-01-31 NOTE — Assessment & Plan Note (Signed)
No change in meds for diabetes at this point but would continue work on diet as his vision symptoms are improving.  He will follow-up with eye clinic.  Clinical significance of his recent labs discussed.  All questions answered.

## 2020-01-31 NOTE — Assessment & Plan Note (Signed)
D/w patient LO:VFIEPPI for colon cancer screening, including IFOB vs. colonoscopy.  Risks and benefits of both were discussed and patient voiced understanding.  Pt elects to price check cologuard.  I will await his input.

## 2020-01-31 NOTE — Assessment & Plan Note (Signed)
With lower blood pressure noted but not lightheaded.  Advised to hold spironolactone in the meantime and then follow-up with cardiology.  Note routed to cardiology as FYI.

## 2020-02-08 NOTE — Progress Notes (Signed)
Cardiology Office Note:    Date:  02/10/2020   ID:  [name], DOB 12-01-1955, MRN [id number]  PCP:  Joaquim Nam, MD  Cardiologist:  Armanda Magic, MD    Referring MD: Joaquim Nam, MD   Chief Complaint  Patient presents with  . Cardiomyopathy  . Coronary Artery Disease  . Hypertension  . Hyperlipidemia    History of Present Illness:    Ivory Bail. is a 64 y.o. male with a hx of CVA 10/2015 treated with IV TPA, moderateLV dysfunction with EF 35-40%,ASCAD withcath 9/12/17showing80% mid CFX lesion, treated with a DES, and a 60% LAD lesion, not significant by FFR. The pt declined high dose statin Rx. Repeat 2D echo 04/10/2016 showed EF 35-40% with diffuse HK.Cardiac MRI showed EF 42% with HK of the mid anterior wall and late gad uptake in the endocardium of the basal and mid anterior walls c/w prior non-transmural infarct.  He is here today for followup and is doing well.  He denies any chest pain or pressure, SOB, DOE, PND, orthopnea, LE edema, dizziness, palpitations or syncope. He has been fatigued and his BP has been running low.  He is compliant with his meds and is tolerating meds with no SE.    Past Medical History:  Diagnosis Date  . Coronary artery disease    cath 01/2016 with 60% LAD with FFR 0.82 and 80% mid LCX s/p PCI  . Diabetes mellitus without complication (HCC)   . Dilated cardiomyopathy (HCC)    EF 35-40% by echo  . Discoid lupus   . Fatty liver   . Former smoker   . Gout 07/2001   [facility]  . History of gout   . Hyperlipidemia LDL goal <70   . Hypertension   . Rosacea   . Stroke [facility])    s/p tpa 2017 w/o residual defecit    Past Surgical History:  Procedure Laterality Date  . CARDIAC CATHETERIZATION N/A 02/06/2016   Procedure: Left Heart Cath and Coronary Angiography;  Surgeon: Corky Crafts, MD;  Location: [facility] INVASIVE CV LAB;  Service: Cardiovascular;  Laterality: N/A;  . CARDIAC CATHETERIZATION N/A  02/06/2016   Procedure: Coronary Stent Intervention;  Surgeon: Corky Crafts, MD;  Location: [facility] INVASIVE CV LAB;  Service: Cardiovascular;  Laterality: N/A;  . CARDIAC CATHETERIZATION N/A 02/06/2016   Procedure: Intravascular Pressure Wire/FFR Study;  Surgeon: Corky Crafts, MD;  Location: [facility] INVASIVE CV LAB;  Service: Cardiovascular;  Laterality: N/A;  . Cardiolite  08/16/2008   [facility]    Current Medications: Current Meds  Medication Sig  . aspirin 81 MG chewable tablet Chew 1 tablet (81 mg total) by mouth daily.  [name] atorvastatin (LIPITOR) 20 MG tablet Take 1 tablet (20 mg total) by mouth daily.  . carvedilol (COREG) 12.5 MG tablet Take 1 tablet (12.5 mg total) by mouth 2 (two) times daily.  . clopidogrel (PLAVIX) 75 MG tablet Take 1 tablet (75 mg total) by mouth daily.  . colchicine 0.6 MG tablet TAKE 1 TABLET BY MOUTH DAILY AS NEEDED FOR GOUT  . nitroGLYCERIN (NITROSTAT) 0.4 MG SL tablet Place 1 tablet (0.4 mg total) under the tongue every 5 (five) minutes as needed for chest pain.  [name] omega-3 acid ethyl esters (LOVAZA) 1 g capsule TAKE TWO CAPSULES BY MOUTH TWICE A DAY  . sacubitril-valsartan (ENTRESTO) 97-103 MG Take 1 tablet by mouth 2 (two) times daily.  [name] spironolactone (ALDACTONE) 25 MG tablet Held as of 01/27/20  .  Tetrahydrozoline HCl (VISINE OP) Place 1 drop into both eyes as needed (for dry eyes).  . Vitamin D, Cholecalciferol, 25 MCG (1000 UT) CAPS Take 1,000 Units by mouth daily.     Allergies:   Iodine and Latex   Social History   Socioeconomic History  . Marital status: Widowed    Spouse name: Not on file  . Number of children: 0  . Years of education: Not on file  . Highest education level: Not on file  Occupational History  . 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 Smoking status: Former Smoker    Packs/day: 2.00    Years: 45.00    Pack years: 90.00    Types: Cigarettes    Quit date: 10/28/2015    Years since  quitting: 4.2  . Smokeless tobacco: Never Used  Vaping Use  . Vaping Use: Never used  Substance and Sexual Activity  . Alcohol use: Yes    Alcohol/week: 2.0 standard drinks    Types: 1 Glasses of wine, 1 Shots of liquor per week  . Drug use: No  . Sexual activity: Not Currently  Other Topics Concern  . Not on file  Social History Narrative   Marine '78-'79   Prev investment banking, now trades crude Mudlogger   Widowed, wife died of mesothelioma   No kids   Social Determinants of Health   Financial Resource Strain:   . Difficulty of Paying Living Expenses: Not on file  Food Insecurity:   . Worried About Programme researcher, broadcasting/film/video in the Last Year: Not on file  . Ran Out of Food in the Last Year: Not on file  Transportation Needs:   . Lack of Transportation (Medical): Not on file  . Lack of Transportation (Non-Medical): Not on file  Physical Activity:   . Days of Exercise per Week: Not on file  . Minutes of Exercise per Session: Not on file  Stress:   . Feeling of Stress : Not on file  Social Connections:   . Frequency of Communication with Friends and Family: Not on file  . Frequency of Social Gatherings with Friends and Family: Not on file  . Attends Religious Services: Not on file  . Active Member of Clubs or Organizations: Not on file  . Attends Banker Meetings: Not on file  . Marital Status: Not on file     Family History: The patient's family history includes CAD in his father and another family member; Heart disease in his father and another family member; Lung cancer in an other family member. There is no history of Colon cancer or Prostate cancer.  ROS:   Please see the history of present illness.    ROS  All other systems reviewed and negative.   EKGs/Labs/Other Studies Reviewed:    The following studies were reviewed today: none  EKG:  EKG is  ordered today.  The ekg ordered today demonstrates NSR with PACs and no ST changes  Recent  Labs: 09/17/2019: ALT 23; Hemoglobin 14.1; Platelets 117; TSH 3.480 01/24/2020: BUN 16; Creatinine, Ser 1.26; Potassium 4.4; Sodium 135   Recent Lipid Panel    Component Value Date/Time   CHOL 140 09/17/2019 0735   TRIG 426 (H) 09/17/2019 0735   HDL 28 (L) 09/17/2019 0735   CHOLHDL 5.0 09/17/2019 0735   CHOLHDL 5.8 (H) 04/10/2016 0917   VLDL 75 (H) 04/10/2016 0917   LDLCALC 49 09/17/2019 0735   LDLDIRECT 104.4 05/12/2008 1304  Physical Exam:    VS:  BP 98/60   Pulse 68   Ht 6' (1.829 m)   Wt (!) 302 lb (137 kg)   BMI 40.96 kg/m     Wt Readings from Last 3 Encounters:  02/10/20 (!) 302 lb (137 kg)  01/27/20 (!) 300 lb 9 oz (136.3 kg)  01/24/20 (!) 300 lb 9 oz (136.3 kg)     GEN:  Well nourished, well developed in no acute distress HEENT: Normal NECK: No JVD; No carotid bruits LYMPHATICS: No lymphadenopathy CARDIAC: RRR, no murmurs, rubs, gallops RESPIRATORY:  Clear to auscultation without rales, wheezing or rhonchi  ABDOMEN: Soft, non-tender, non-distended MUSCULOSKELETAL:  No edema; No deformity  SKIN: Warm and dry NEUROLOGIC:  Alert and oriented x 3 PSYCHIATRIC:  Normal affect   ASSESSMENT:    1. DCM (dilated cardiomyopathy) (HCC)   2. Coronary artery disease involving native coronary artery of native heart without angina pectoris   3. Essential hypertension   4. Hyperlipidemia with target LDL less than 70    PLAN:    In order of problems listed above:  1. DCM - EF 35-40% by echo 2017 and 42% by cardiac MRI 05/2016.  -This is likely mixed ischemic/nonischemic.  - he does not appear volume overloaded on exam today - continue Entresto 49-51mg  BID  - decrease carvedilol 6.25mg  BID due to soft BP - spiro was stopped due to soft BP - creatinine stable at 1.26 on outside labs from PCP 12/2019  2. ASCAD - cath 9/12/17showing80% mid CFX lesion, treated with a DES, and a 60% LAD lesion, not significant by FFR. - he denies any anginal sx since I saw  him last - continue ASA, Plavix, statin and BB  3 . HTN -BP controlled on exam today and actually on the soft side -continue Entresto -decrease Carvedilol to 6.25mg  BID due to soft BP  4. Hyperlipidemia -LDL goal <70.  -LDL was 49 in April 2021 -continue atorvastatin 20mg  daily  5.  Morbid Obesity I have encouraged him to get into a routine exercise program and cut back on carbs and portions.   6.  Severe fatigue -suspect related to low BP -will decrease carvedilol 6.25mg  BID due to soft BP -I have recommended that he see his PCP to have his testosterone level checked -TSH and CBC were normal earlier this year -check 2D echo    Medication Adjustments/Labs and Tests Ordered: Current medicines are reviewed at length with the patient today.  Concerns regarding medicines are outlined above.  No orders of the defined types were placed in this encounter.  No orders of the defined types were placed in this encounter.   Signed, , MD  02/10/2020 8:14 AM    [facility]

## 2020-02-10 ENCOUNTER — Encounter: Payer: Self-pay | Admitting: Cardiology

## 2020-02-10 ENCOUNTER — Other Ambulatory Visit: Payer: Self-pay

## 2020-02-10 ENCOUNTER — Ambulatory Visit (INDEPENDENT_AMBULATORY_CARE_PROVIDER_SITE_OTHER): Payer: Self-pay | Admitting: Cardiology

## 2020-02-10 VITALS — BP 98/60 | HR 68 | Ht 72.0 in | Wt 302.0 lb

## 2020-02-10 DIAGNOSIS — I2510 Atherosclerotic heart disease of native coronary artery without angina pectoris: Secondary | ICD-10-CM

## 2020-02-10 DIAGNOSIS — I420 Dilated cardiomyopathy: Secondary | ICD-10-CM

## 2020-02-10 DIAGNOSIS — I10 Essential (primary) hypertension: Secondary | ICD-10-CM

## 2020-02-10 DIAGNOSIS — E785 Hyperlipidemia, unspecified: Secondary | ICD-10-CM

## 2020-02-10 MED ORDER — CARVEDILOL 6.25 MG PO TABS: 6.2500 mg | ORAL_TABLET | Freq: Two times a day (BID) | ORAL | 3 refills | Status: DC

## 2020-02-10 NOTE — Addendum Note (Signed)
Addended by: Theresia Majors on: 02/10/2020 08:28 AM   Modules accepted: Orders

## 2020-02-10 NOTE — Patient Instructions (Addendum)
Medication Instructions:  Your physician has recommended you make the following change in your medication: 1) DECREASE carvedilol to 6.25 mg twice daily   *If you need a refill on your cardiac medications before your next appointment, please call your pharmacy*  Testing/Procedures: Your physician has requested that you have an echocardiogram. Echocardiography is a painless test that uses sound waves to create images of your heart. It provides your doctor with information about the size and shape of your heart and how well your heart's chambers and valves are working. This procedure takes approximately one hour. There are no restrictions for this procedure.  Follow-Up: At [facility], you and your health needs are our priority.  As part of our continuing mission to provide you with exceptional heart care, we have created designated Provider Care Teams.  These Care Teams include your primary Cardiologist (physician) and Advanced Practice Providers (APPs -  Physician Assistants and Nurse Practitioners) who all work together to provide you with the care you need, when you need it.  Your next appointment:   6 month(s)  The format for your next appointment:   In Person  Provider:   You may see Armanda Magic, MD or one of the following Advanced Practice Providers on your designated Care Team:    Ronie Spies, PA-C  Jacolyn Reedy, PA-C

## 2020-02-28 ENCOUNTER — Ambulatory Visit (HOSPITAL_COMMUNITY): Payer: Self-pay | Attending: Cardiovascular Disease

## 2020-02-28 ENCOUNTER — Other Ambulatory Visit: Payer: Self-pay

## 2020-02-28 DIAGNOSIS — I420 Dilated cardiomyopathy: Secondary | ICD-10-CM | POA: Insufficient documentation

## 2020-02-28 LAB — ECHOCARDIOGRAM COMPLETE
Area-P 1/2: 2.94 cm2
S' Lateral: 2.7 cm

## 2020-02-29 ENCOUNTER — Telehealth: Payer: Self-pay | Admitting: Cardiology

## 2020-02-29 NOTE — Telephone Encounter (Signed)
The patient has been notified of the result and verbalized understanding.  All questions (if any) were answered. Theresia Majors, RN 02/29/2020 8:35 AM

## 2020-02-29 NOTE — Telephone Encounter (Signed)
Colton [name] is returning Carlyle's call in regards to his Echo results.

## 2020-03-17 ENCOUNTER — Other Ambulatory Visit: Payer: Self-pay | Admitting: Cardiology

## 2020-06-07 ENCOUNTER — Ambulatory Visit: Payer: Self-pay | Admitting: Family Medicine

## 2020-07-17 ENCOUNTER — Other Ambulatory Visit: Payer: Self-pay

## 2020-07-17 MED ORDER — ENTRESTO 97-103 MG PO TABS: 1.0000 | ORAL_TABLET | Freq: Two times a day (BID) | ORAL | 2 refills | Status: DC

## 2020-08-15 ENCOUNTER — Ambulatory Visit (INDEPENDENT_AMBULATORY_CARE_PROVIDER_SITE_OTHER): Payer: Self-pay | Admitting: Cardiology

## 2020-08-15 ENCOUNTER — Encounter: Payer: Self-pay | Admitting: Cardiology

## 2020-08-15 ENCOUNTER — Other Ambulatory Visit: Payer: Self-pay

## 2020-08-15 VITALS — BP 118/72 | HR 69 | Ht 72.0 in | Wt 310.2 lb

## 2020-08-15 DIAGNOSIS — I2510 Atherosclerotic heart disease of native coronary artery without angina pectoris: Secondary | ICD-10-CM

## 2020-08-15 DIAGNOSIS — E785 Hyperlipidemia, unspecified: Secondary | ICD-10-CM

## 2020-08-15 DIAGNOSIS — I420 Dilated cardiomyopathy: Secondary | ICD-10-CM

## 2020-08-15 DIAGNOSIS — I10 Essential (primary) hypertension: Secondary | ICD-10-CM

## 2020-08-15 LAB — COMPREHENSIVE METABOLIC PANEL
ALT: 52 IU/L — ABNORMAL HIGH (ref 0–44)
AST: 32 IU/L (ref 0–40)
Albumin/Globulin Ratio: 1.9 (ref 1.2–2.2)
Albumin: 4.4 g/dL (ref 3.8–4.8)
Alkaline Phosphatase: 96 IU/L (ref 44–121)
BUN/Creatinine Ratio: 14 (ref 10–24)
BUN: 15 mg/dL (ref 8–27)
Bilirubin Total: 0.3 mg/dL (ref 0.0–1.2)
CO2: 20 mmol/L (ref 20–29)
Calcium: 9.2 mg/dL (ref 8.6–10.2)
Chloride: 104 mmol/L (ref 96–106)
Creatinine, Ser: 1.11 mg/dL (ref 0.76–1.27)
Globulin, Total: 2.3 g/dL (ref 1.5–4.5)
Glucose: 139 mg/dL — ABNORMAL HIGH (ref 65–99)
Potassium: 4.6 mmol/L (ref 3.5–5.2)
Sodium: 140 mmol/L (ref 134–144)
Total Protein: 6.7 g/dL (ref 6.0–8.5)
eGFR: 74 mL/min/{1.73_m2} (ref >59)

## 2020-08-15 LAB — LIPID PANEL
Chol/HDL Ratio: 4.4 ratio (ref 0.0–5.0)
Cholesterol, Total: 122 mg/dL (ref 100–199)
HDL: 28 mg/dL — ABNORMAL LOW (ref >39)
LDL Chol Calc (NIH): 45 mg/dL (ref 0–99)
Triglycerides: 320 mg/dL — ABNORMAL HIGH (ref 0–149)
VLDL Cholesterol Cal: 49 mg/dL — ABNORMAL HIGH (ref 5–40)

## 2020-08-15 LAB — TSH: TSH: 2.57 u[IU]/mL (ref 0.450–4.500)

## 2020-08-15 NOTE — Progress Notes (Signed)
Cardiology Office Note:    Date:  08/15/2020   ID:  Colton Bors., DOB 05-07-1956, MRN [id number]  PCP:  Joaquim Nam, MD  Cardiologist:  Armanda Magic, MD    Referring MD: Joaquim Nam, MD   Chief Complaint  Patient presents with   Cardiomyopathy   Coronary Artery Disease   Hypertension   Hyperlipidemia    History of Present Illness:    Colton Makarewicz. is a 65 y.o. male with a hx of CVA 10/2015 treated with IV TPA, moderateLV dysfunction with EF 35-40%,ASCAD withcath 9/12/17showing80% mid CFX lesion, treated with a DES, and a 60% LAD lesion, not significant by FFR. The pt declined high dose statin Rx. Repeat 2D echo 04/10/2016 showed EF 35-40% with diffuse HK.Cardiac MRI showed EF 42% with HK of the mid anterior wall and late gad uptake in the endocardium of the basal and mid anterior walls c/w prior non-transmural infarct. 2D echo done 02/2020 showed normalization of LVF with mildly thickened heart muscle and G2DD with mild AVSC.  He  is here today for followup and is doing well.  He has chronic DOE related to morbid obesity.  He denies any chest pain or pressure,  PND, orthopnea, LE edema, dizziness, palpitations or syncope. He is compliant with his meds and is tolerating meds with no SE.    Past Medical History:  Diagnosis Date   Coronary artery disease    cath 01/2016 with 60% LAD with FFR 0.82 and 80% mid LCX s/p PCI   Diabetes mellitus without complication (HCC)    Dilated cardiomyopathy (HCC)    EF 35-40% by [id number] and 42% by cardiac MR.  EF normalized on echo 02/2020   Discoid lupus    Fatty liver    Former smoker    Gout 07/2001   [facility]   History of gout    Hyperlipidemia LDL goal <70    Hypertension    Rosacea    Stroke [facility])    s/p tpa 2017 w/o residual defecit    Past Surgical History:  Procedure Laterality Date   CARDIAC CATHETERIZATION N/A 02/06/2016   Procedure: Left Heart Cath and Coronary  Angiography;  Surgeon: Corky Crafts, MD;  Location: [facility] INVASIVE CV LAB;  Service: Cardiovascular;  Laterality: N/A;   CARDIAC CATHETERIZATION N/A 02/06/2016   Procedure: Coronary Stent Intervention;  Surgeon: Corky Crafts, MD;  Location: [facility] INVASIVE CV LAB;  Service: Cardiovascular;  Laterality: N/A;   CARDIAC CATHETERIZATION N/A 02/06/2016   Procedure: Intravascular Pressure Wire/FFR Study;  Surgeon: Corky Crafts, MD;  Location: [facility] INVASIVE CV LAB;  Service: Cardiovascular;  Laterality: N/A;   Cardiolite  08/16/2008   [facility]    Current Medications: Current Meds  Medication Sig   aspirin 81 MG chewable tablet Chew 1 tablet (81 mg total) by mouth daily.   atorvastatin (LIPITOR) 20 MG tablet Take 1 tablet (20 mg total) by mouth daily.   carvedilol (COREG) 6.25 MG tablet Take 1 tablet (6.25 mg total) by mouth 2 (two) times daily.   clopidogrel (PLAVIX) 75 MG tablet Take 1 tablet (75 mg total) by mouth daily.   colchicine 0.6 MG tablet TAKE 1 TABLET BY MOUTH DAILY AS NEEDED FOR GOUT   HYDROcodone-acetaminophen (NORCO/VICODIN) 5-325 MG tablet Take 1 tablet by mouth every 6 (six) hours as needed for severe pain (for gout).   nitroGLYCERIN (NITROSTAT) 0.4 MG SL tablet Place 1 tablet (0.4 mg total) under the tongue every 5 (five) minutes  as needed for chest pain.   omega-3 acid ethyl esters (LOVAZA) 1 g capsule TAKE TWO CAPSULES BY MOUTH TWICE A DAY   predniSONE (DELTASONE) 10 MG tablet Take 2 a day for 5 days, then 1 a day for 5 days, with food. Don't take with aleve/ibuprofen.  Use only if colchicine doesn't help with gout. (Patient taking differently: Take 2 a day for 5 days, then 1 a day for 5 days, with food. Don't take with aleve/ibuprofen.  Use only if colchicine doesn't help with gout.)   sacubitril-valsartan (ENTRESTO) 97-103 MG Take 1 tablet by mouth 2 (two) times daily.   spironolactone (ALDACTONE) 25 MG tablet Held as of 01/27/20    Tetrahydrozoline HCl (VISINE OP) Place 1 drop into both eyes as needed (for dry eyes).   Vitamin D, Cholecalciferol, 25 MCG (1000 UT) CAPS Take 1,000 Units by mouth daily.     Allergies:   Iodine and Latex   Social History   Socioeconomic History   Marital status: Widowed    Spouse name: Not on file   Number of children: 0   Years of education: Not on file   Highest education level: Not on file  Occupational History   Occupation: Property Patents/Inventions/Patents  Tobacco Use   Smoking status: Former Smoker    Packs/day: 2.00    Years: 45.00    Pack years: 90.00    Types: Cigarettes    Quit date: 10/28/2015    Years since quitting: 4.8   Smokeless tobacco: Never Used  Vaping Use   Vaping Use: Never used  Substance and Sexual Activity   Alcohol use: Yes    Alcohol/week: 2.0 standard drinks    Types: 1 Glasses of wine, 1 Shots of liquor per week   Drug use: No   Sexual activity: Not Currently  Other Topics Concern   Not on file  Social History Narrative   Marine '78-'79   Prev investment banking, now trades crude Mudlogger   Widowed, wife died of mesothelioma   No kids   Social Determinants of Corporate investment banker Strain: Not on file  Food Insecurity: Not on file  Transportation Needs: Not on file  Physical Activity: Not on file  Stress: Not on file  Social Connections: Not on file     Family History: The patient's family history includes CAD in his father and another family member; Heart disease in his father and another family member; Lung cancer in an other family member. There is no history of Colon cancer or Prostate cancer.  ROS:   Please see the history of present illness.    ROS  All other systems reviewed and negative.   EKGs/Labs/Other Studies Reviewed:    The following studies were reviewed today: none  EKG:  EKG is  ordered today.  The ekg ordered today demonstrates NSR with PACs and no ST changes  Recent  Labs: 09/17/2019: ALT 23; Hemoglobin 14.1; Platelets 117; TSH 3.480 01/24/2020: BUN 16; Creatinine, Ser 1.26; Potassium 4.4; Sodium 135   Recent Lipid Panel    Component Value Date/Time   CHOL 140 09/17/2019 0735   TRIG 426 (H) 09/17/2019 0735   HDL 28 (L) 09/17/2019 0735   CHOLHDL 5.0 09/17/2019 0735   CHOLHDL 5.8 (H) 04/10/2016 0917   VLDL 75 (H) 04/10/2016 0917   LDLCALC 49 09/17/2019 0735   LDLDIRECT 104.4 05/12/2008 1304    Physical Exam:    VS:  BP 118/72    Pulse 69  Ht 6' (1.829 m)    Wt (!) 310 lb 3.2 oz (140.7 kg)    BMI 42.07 kg/m     Wt Readings from Last 3 Encounters:  08/15/20 (!) 310 lb 3.2 oz (140.7 kg)  02/10/20 (!) 302 lb (137 kg)  01/27/20 (!) 300 lb 9 oz (136.3 kg)     GEN: Well nourished, well developed in no acute distress HEENT: Normal NECK: No JVD; No carotid bruits LYMPHATICS: No lymphadenopathy CARDIAC:RRR, no murmurs, rubs, gallops RESPIRATORY:  Clear to auscultation without rales, wheezing or rhonchi  ABDOMEN: Soft, non-tender, non-distended MUSCULOSKELETAL:  No edema; No deformity  SKIN: Warm and dry NEUROLOGIC:  Alert and oriented x 3 PSYCHIATRIC:  Normal affect    ASSESSMENT:    1. DCM (dilated cardiomyopathy) (HCC)   2. Coronary artery disease involving native coronary artery of native heart without angina pectoris   3. Primary hypertension   4. Hyperlipidemia with target LDL less than 70    PLAN:    In order of problems listed above:  1. DCM -This is likely mixed ischemic/nonischemic.  - EF 35-40% by echo 2017 and 42% by cardiac MRI 05/2016.  - EF normalized at 60-65% by echo 02/2020 - he appears euvolemic on exam - continue Entresto 97-103mg  BID, carvedilol 6.25mg  BID - creatinine stable at 1.26 on outside labs from PCP 12/2019 - check CMET  2. ASCAD - cath 9/12/17showing80% mid CFX lesion, treated with a DES, and a 60% LAD lesion, not significant by FFR. - he denies any anginal symptoms - he was having some  fatigue and echo showed normal LVF - continue ASA, Plavix, statin and BB  3 . HTN -BP controlled on exam today -continue Entresto 97-103mg  BID and Carvedilol to 6.25mg  BID  4. Hyperlipidemia -LDL goal <70.  -LDL was 49 in April 2021 -continue atorvastatin 20mg  daily -check FLP and ALT  5.  Morbid Obesity -I have encouraged him to get into a routine exercise program and cut back on carbs and portions.  -he has gained 10lbs over the past 6 months -check TSH -Refer back to Healthy Weight and Wellness   Medication Adjustments/Labs and Tests Ordered: Current medicines are reviewed at length with the patient today.  Concerns regarding medicines are outlined above.  Orders Placed This Encounter  Procedures   EKG 12-Lead   No orders of the defined types were placed in this encounter.   Signed, , MD  08/15/2020 8:52 AM    [facility]

## 2020-08-15 NOTE — Addendum Note (Signed)
Addended by: Theresia Majors on: 08/15/2020 09:00 AM   Modules accepted: Orders

## 2020-08-15 NOTE — Patient Instructions (Signed)
Medication Instructions:  Your physician recommends that you continue on your current medications as directed. Please refer to the Current Medication list given to you today.  *If you need a refill on your cardiac medications before your next appointment, please call your pharmacy*   Lab Work: TODAY: CMET, Fasting lipids, TSH If you have labs (blood work) drawn today and your tests are completely normal, you will receive your results only by: [name] MyChart Message (if you have MyChart) OR . A paper copy in the mail If you have any lab test that is abnormal or we need to change your treatment, we will call you to review the results.  Follow-Up: At [facility], you and your health needs are our priority.  As part of our continuing mission to provide you with exceptional heart care, we have created designated Provider Care Teams.  These Care Teams include your primary Cardiologist (physician) and Advanced Practice Providers (APPs -  Physician Assistants and Nurse Practitioners) who all work together to provide you with the care you need, when you need it.  Your next appointment:   1 year(s)  The format for your next appointment:   In Person  Provider:   You may see Armanda Magic, MD or one of the following Advanced Practice Providers on your designated Care Team:    Ronie Spies, PA-C  Jacolyn Reedy, PA-C

## 2020-08-15 NOTE — Addendum Note (Signed)
Addended by: Theresia Majors on: 08/15/2020 09:09 AM   Modules accepted: Orders

## 2020-08-16 ENCOUNTER — Telehealth: Payer: Self-pay

## 2020-08-16 DIAGNOSIS — E785 Hyperlipidemia, unspecified: Secondary | ICD-10-CM

## 2020-08-16 NOTE — Telephone Encounter (Signed)
-----   Message from Quintella Reichert, MD sent at 08/16/2020  3:05 PM EDT ----- Needs to cut out wine all together and repeat fasting lipids and ALT ----- Message ----- From: Theresia Majors, RN Sent: 08/16/2020  12:52 PM EDT To: Quintella Reichert, MD  The patient has been notified of the result and verbalized understanding.  All questions (if any) were answered. Theresia Majors, RN 08/16/2020 12:51 PM   Patient was not fasting yesterday - he had coffee and toast that morning.  He has not been taking Tylenol. He drinks 2-3 glasses of wine per week.

## 2020-08-16 NOTE — Telephone Encounter (Signed)
Left message for patient letting him know to cut out alcohol and call back to schedule repeat lab work.

## 2020-08-17 MED ORDER — ATORVASTATIN CALCIUM 20 MG PO TABS: 20.0000 mg | ORAL_TABLET | Freq: Every day | ORAL | 3 refills | Status: DC

## 2020-08-17 MED ORDER — CLOPIDOGREL BISULFATE 75 MG PO TABS: 75.0000 mg | ORAL_TABLET | Freq: Every day | ORAL | 3 refills | Status: DC

## 2020-08-17 MED ORDER — CARVEDILOL 6.25 MG PO TABS: 6.2500 mg | ORAL_TABLET | Freq: Two times a day (BID) | ORAL | 3 refills | Status: DC

## 2020-08-17 MED ORDER — ENTRESTO 97-103 MG PO TABS: 1.0000 | ORAL_TABLET | Freq: Two times a day (BID) | ORAL | 3 refills | Status: DC

## 2020-08-17 MED ORDER — NITROGLYCERIN 0.4 MG SL SUBL: 0.4000 mg | SUBLINGUAL_TABLET | SUBLINGUAL | 2 refills | Status: DC | PRN

## 2020-08-17 NOTE — Telephone Encounter (Signed)
Patient returning call.

## 2020-08-17 NOTE — Telephone Encounter (Signed)
Spoke with the patient and have scheduled him to repeat lab work next week. Patient is also requesting refills on his cardiac medications. I will send in refills to his requesting pharmacy.

## 2020-08-20 ENCOUNTER — Other Ambulatory Visit: Payer: Self-pay | Admitting: Physician Assistant

## 2020-08-24 ENCOUNTER — Other Ambulatory Visit: Payer: Self-pay

## 2020-08-24 ENCOUNTER — Telehealth: Payer: Self-pay

## 2020-08-24 ENCOUNTER — Other Ambulatory Visit: Payer: Self-pay | Admitting: *Deleted

## 2020-08-24 DIAGNOSIS — E785 Hyperlipidemia, unspecified: Secondary | ICD-10-CM

## 2020-08-24 LAB — LIPID PANEL
Chol/HDL Ratio: 5.3 ratio — ABNORMAL HIGH (ref 0.0–5.0)
Cholesterol, Total: 133 mg/dL (ref 100–199)
HDL: 25 mg/dL — ABNORMAL LOW (ref >39)
LDL Chol Calc (NIH): 57 mg/dL (ref 0–99)
Triglycerides: 326 mg/dL — ABNORMAL HIGH (ref 0–149)
VLDL Cholesterol Cal: 51 mg/dL — ABNORMAL HIGH (ref 5–40)

## 2020-08-24 LAB — ALT: ALT: 41 IU/L (ref 0–44)

## 2020-08-24 NOTE — Telephone Encounter (Signed)
**Note De-Identified [place] [facility]essel Obfuscation** Letter received from [employer] stating that they approved the pt for asst with his Sherryll Burger for the remainder of this year. Pt ID: [id number] The letter states that they have notified the pt of this approval as well.

## 2020-08-29 ENCOUNTER — Telehealth: Payer: Self-pay

## 2020-08-29 DIAGNOSIS — E785 Hyperlipidemia, unspecified: Secondary | ICD-10-CM

## 2020-08-29 MED ORDER — ICOSAPENT ETHYL 1 G PO CAPS: 2.0000 g | ORAL_CAPSULE | Freq: Two times a day (BID) | ORAL | 3 refills | Status: DC

## 2020-08-29 MED ORDER — FENOFIBRATE 48 MG PO TABS: 48.0000 mg | ORAL_TABLET | Freq: Every day | ORAL | 3 refills | Status: DC

## 2020-08-29 NOTE — Telephone Encounter (Signed)
Spoke with the patient and he will stop taking Lovaza and start on Vascepa 2 g BID and fenofibrate 48 mg daily. Lipids will be rechecked in 10/2020

## 2020-08-29 NOTE — Telephone Encounter (Signed)
-----   Message from Cheree Ditto, [facility] sent at 08/29/2020 11:02 AM EDT ----- Recommend d/c Lovaza and start Vascepa 2grams BID and start fenofibrate 48mg .  Recheck lipids in 4-8 weeks

## 2020-09-30 ENCOUNTER — Other Ambulatory Visit: Payer: Self-pay | Admitting: Cardiology

## 2020-10-27 ENCOUNTER — Telehealth: Payer: Self-pay | Admitting: Cardiology

## 2020-10-27 NOTE — Telephone Encounter (Signed)
Pt c/o of Chest Pain: STAT if CP now or developed within 24 hours  1. Are you having CP right now? Chest Tightness not Chest Pain and he's not having it right now  2. Are you experiencing any other symptoms (ex. SOB, nausea, vomiting, sweating)?  No  3. How long have you been experiencing CP? yesterday  4. Is your CP continuous or coming and going? Coming and going  5. Have you taken Nitroglycerin? NO  Pt feels like is more muscular than a Chest Pain ?

## 2020-10-27 NOTE — Telephone Encounter (Signed)
Spoke with pt who is complaining of intermittent CP, left upper chest since yesterday 10/26/2020.  Pt states it only happens when lying down.  He denies any additional symptoms such as  SOB, dizziness nausea, vomiting or dizziness.  He states the pain will eventually resolve on it's own.  He is taking medications as prescribed.  Appointment scheduled with DOD, Donato Schultz for Tuesday 10/31/2020 at 330pm.  Reviewed ED precautions.  Pt verbalizes understanding and agrees with current plan.

## 2020-10-31 ENCOUNTER — Encounter: Payer: Self-pay | Admitting: Cardiology

## 2020-10-31 ENCOUNTER — Ambulatory Visit (INDEPENDENT_AMBULATORY_CARE_PROVIDER_SITE_OTHER): Payer: Self-pay | Admitting: Cardiology

## 2020-10-31 ENCOUNTER — Other Ambulatory Visit: Payer: Self-pay

## 2020-10-31 VITALS — BP 100/60 | HR 73 | Ht 72.0 in | Wt 308.0 lb

## 2020-10-31 DIAGNOSIS — R079 Chest pain, unspecified: Secondary | ICD-10-CM

## 2020-10-31 DIAGNOSIS — I2510 Atherosclerotic heart disease of native coronary artery without angina pectoris: Secondary | ICD-10-CM

## 2020-10-31 NOTE — Progress Notes (Signed)
Cardiology Office Note:    Date:  10/31/2020   ID:  Colton Bors., DOB 07/13/1955, MRN [id number]  PCP:  Joaquim Nam, MD   [facility] Providers Cardiologist:  Armanda Magic, MD     Referring MD: Joaquim Nam, MD   History of Present Illness:    Colton [name]. is a 65 y.o. male here for intermittent CP for TT.  Previous Visit: He has history of CVA 10/2015 treated with IV TPA, moderateLV dysfunction with EF 35-40%,ASCAD withcath 9/12/17showing80% mid CFX lesion, treated with a DES, and a 60% LAD lesion, not significant by FFR. The pt declined high dose statin Rx. Repeat 2D echo 04/10/2016 showed EF 35-40% with diffuse HK.Cardiac MRI showed EF 42% with HK of the mid anterior wall and late gad uptake in the endocardium of the basal and mid anterior walls c/w prior non-transmural infarct. 2D echo done 02/2020 showed normalization of LVF with mildly AVSC. He has chronic DOE related to morbid obesity.   Today, he's feeling well. He spoke with Dr. Armanda Magic on 10/27/2020 for intermittent chest pain.  In 2017 he had a stroke and he is concerned his chest pain may be related to early onset of another stroke   He is making lifestyle changes to lose weight. He reports walking on the treadmill. He believes his increase in activity could be another cause of his chest pain. He denies having any exertional shortness of breath, chest pain, tightness, or pressure. He denies any LE edema, PND, orthopnea, lightheadedness, or syncopal episodes.   He is tolerating 1 tablet of entresto for his DCM. He reports it's a miracle drug.     Past Medical History:  Diagnosis Date  . Coronary artery disease    cath 01/2016 with 60% LAD with FFR 0.82 and 80% mid LCX s/p PCI  . Diabetes mellitus without complication (HCC)   . Dilated cardiomyopathy (HCC)    EF 35-40% by [id number] and 42% by cardiac MR.  EF normalized on echo 02/2020  . Discoid lupus   . Fatty liver   . Former smoker    . Gout 07/2001   [facility]  . History of gout   . Hyperlipidemia LDL goal <70   . Hypertension   . Rosacea   . Stroke [facility])    s/p tpa 2017 w/o residual defecit    Past Surgical History:  Procedure Laterality Date  . CARDIAC CATHETERIZATION N/A 02/06/2016   Procedure: Left Heart Cath and Coronary Angiography;  Surgeon: Corky Crafts, MD;  Location: [facility] INVASIVE CV LAB;  Service: Cardiovascular;  Laterality: N/A;  . CARDIAC CATHETERIZATION N/A 02/06/2016   Procedure: Coronary Stent Intervention;  Surgeon: Corky Crafts, MD;  Location: [facility] INVASIVE CV LAB;  Service: Cardiovascular;  Laterality: N/A;  . CARDIAC CATHETERIZATION N/A 02/06/2016   Procedure: Intravascular Pressure Wire/FFR Study;  Surgeon: Corky Crafts, MD;  Location: [facility] INVASIVE CV LAB;  Service: Cardiovascular;  Laterality: N/A;  . Cardiolite  08/16/2008   [facility]    Current Medications: Current Meds  Medication Sig  . allopurinol (ZYLOPRIM) 100 MG tablet Take 1 tablet (100 mg total) by mouth daily.  [name] aspirin 81 MG chewable tablet Chew 1 tablet (81 mg total) by mouth daily.  [name] atorvastatin (LIPITOR) 20 MG tablet Take 1 tablet (20 mg total) by mouth daily.  . carvedilol (COREG) 6.25 MG tablet Take 1 tablet (6.25 mg total) by mouth 2 (two) times daily.  . clopidogrel (PLAVIX) 75  MG tablet Take 1 tablet (75 mg total) by mouth daily.  . colchicine 0.6 MG tablet TAKE 1 TABLET BY MOUTH DAILY AS NEEDED FOR GOUT  . fenofibrate (TRICOR) 48 MG tablet Take 1 tablet (48 mg total) by mouth daily.  [name] HYDROcodone-acetaminophen (NORCO/VICODIN) 5-325 MG tablet Take 1 tablet by mouth every 6 (six) hours as needed for severe pain (for gout).  [name] icosapent Ethyl (VASCEPA) 1 g capsule Take 2 capsules (2 g total) by mouth 2 (two) times daily.  . nitroGLYCERIN (NITROSTAT) 0.4 MG SL tablet Place 1 tablet (0.4 mg total) under the tongue every 5 (five) minutes as needed for chest pain.  . predniSONE (DELTASONE)  10 MG tablet Take 2 a day for 5 days, then 1 a day for 5 days, with food. Don't take with aleve/ibuprofen.  Use only if colchicine doesn't help with gout. (Patient taking differently: Take 2 a day for 5 days, then 1 a day for 5 days, with food. Don't take with aleve/ibuprofen.  Use only if colchicine doesn't help with gout.)  . sacubitril-valsartan (ENTRESTO) 97-103 MG Take 1 tablet by mouth 2 (two) times daily.  [name] spironolactone (ALDACTONE) 25 MG tablet TAKE 1/2 TABLET BY MOUTH DAILY  . Tetrahydrozoline HCl (VISINE OP) Place 1 drop into both eyes as needed (for dry eyes).  . Vitamin D, Cholecalciferol, 25 MCG (1000 UT) CAPS Take 1,000 Units by mouth daily.     Allergies:   Iodine and Latex   Social History   Socioeconomic History  . Marital status: Widowed    Spouse name: Not on file  . Number of children: 0  . Years of education: Not on file  . Highest education level: Not on file  Occupational History  . 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 Smoking status: Former Smoker    Packs/day: 2.00    Years: 45.00    Pack years: 90.00    Types: Cigarettes    Quit date: 10/28/2015    Years since quitting: 5.0  . Smokeless tobacco: Never Used  Vaping Use  . Vaping Use: Never used  Substance and Sexual Activity  . Alcohol use: Yes    Alcohol/week: 2.0 standard drinks    Types: 1 Glasses of wine, 1 Shots of liquor per week  . Drug use: No  . Sexual activity: Not Currently  Other Topics Concern  . Not on file  Social History Narrative   [employer] '78-'79   Prev investment banking, now trades crude Mudlogger   Widowed, wife died of mesothelioma   No kids   Social Determinants of Corporate investment banker Strain: Not on file  Food Insecurity: Not on file  Transportation Needs: Not on file  Physical Activity: Not on file  Stress: Not on file  Social Connections: Not on file     Family History: The patient's \ family history includes CAD in his  father and another family member; Heart disease in his father and another family member; Lung cancer in an other family member. There is no history of Colon cancer or Prostate cancer.  ROS:   Please see the history of present illness. All other systems reviewed and are negative.  EKGs/Labs/Other Studies Reviewed:    The following studies were reviewed today: Echo 02/28/2020  1. Left ventricular ejection fraction, by estimation, is 60 to 65%. The left ventricle has normal function. The left ventricle has no regional wall motion abnormalities. There is mild concentric left ventricular hypertrophy. Left ventricular  diastolic parameters are consistent with Grade II diastolic dysfunction (pseudonormalization). 2. Right ventricular systolic function is normal. The right ventricular size is normal. 3. The mitral valve is normal in structure. No evidence of mitral valve regurgitation. No evidence of mitral stenosis. 4. The aortic valve is tricuspid. There is mild calcification of the aortic valve. There is mild thickening of the aortic valve. Aortic valve regurgitation is not visualized. No aortic stenosis is present. 5. The inferior vena cava is normal in size with greater than 50% respiratory variability, suggesting right atrial pressure of 3 mmHg.  EKG:   10/31/2020- normal sinus 73 bpm  Recent Labs: 08/15/2020: BUN 15; Creatinine, Ser 1.11; Potassium 4.6; Sodium 140; TSH 2.570 08/24/2020: ALT 41  Recent Lipid Panel    Component Value Date/Time   CHOL 133 08/24/2020 0712   TRIG 326 (H) 08/24/2020 0712   HDL 25 (L) 08/24/2020 0712   CHOLHDL 5.3 (H) 08/24/2020 0712   CHOLHDL 5.8 (H) 04/10/2016 0917   VLDL 75 (H) 04/10/2016 0917   LDLCALC 57 08/24/2020 0712   LDLDIRECT 104.4 05/12/2008 1304     Risk Assessment/Calculations:      Physical Exam:    VS:  BP 100/60 (BP Location: Left Arm, Patient Position: Sitting, Cuff Size: Large)   Pulse 73   Ht 6' (1.829 m)   Wt (!) 308 lb  (139.7 kg)   SpO2 95%   BMI 41.77 kg/m     Wt Readings from Last 3 Encounters:  10/31/20 (!) 308 lb (139.7 kg)  08/15/20 (!) 310 lb 3.2 oz (140.7 kg)  02/10/20 (!) 302 lb (137 kg)     GEN: Well nourished, well developed in no acute distress HEENT: Normal NECK: No JVD; No carotid bruits LYMPHATICS: No lymphadenopathy CARDIAC: RRR, no murmurs, rubs, gallops RESPIRATORY:  Clear to auscultation without rales, wheezing or rhonchi  ABDOMEN: Soft, non-tender, non-distended MUSCULOSKELETAL:  No edema; No deformity  SKIN: Warm and dry NEUROLOGIC:  Alert and oriented x 3 PSYCHIATRIC:  Normal affect   ASSESSMENT:    1. Chest pain of uncertain etiology   2. Coronary artery disease involving native coronary artery of native heart without angina pectoris    PLAN:    In order of problems listed above:  Chest pain - Feeling better currently.  Left upper sided chest discomfort.  He thinks it is from holding onto the treadmill after starting to walk more vigorously.  He would feel it when he was twisting, laying down.  Clearly sounds musculoskeletal.  It has dissipated.  Reassurance.  EKG unremarkable.  If symptoms were to become more worrisome, we could always consider further testing with Lexiscan stress test.  DCM Recently his echocardiogram showed EF of 65%.  Excellent.  Continue with Entresto high-dose regimen with refills as needed.  Excellent medical management.  ASCAD Prior cardiac catheterization reviewed.  Once again I do believe that his current dissipating chest pain symptoms are musculoskeletal.  HTN Excellent control.  Multidrug regimen as above.  Hyperlipidemia Continue with atorvastatin 20 mg a day.  Last LDL 57 less than 70.  Morbid Obesity Trying to lose weight.  BMI 41.  Discussed decreasing carbohydrates.  Continue follow-up with [name].   Medication Adjustments/Labs and Tests Ordered: Current medicines are reviewed at length with the patient today.   Concerns regarding medicines are outlined above.  Orders Placed This Encounter  Procedures  . EKG 12-Lead   No orders of the defined types were placed in this encounter.   Patient  Instructions  Medication Instructions:  The current medical regimen is effective;  continue present plan and medications.  *If you need a refill on your cardiac medications before your next appointment, please call your pharmacy*  Follow-Up: At [facility], you and your health needs are our priority.  As part of our continuing mission to provide you with exceptional heart care, we have created designated Provider Care Teams.  These Care Teams include your primary Cardiologist (physician) and Advanced Practice Providers (APPs -  Physician Assistants and Nurse Practitioners) who all work together to provide you with the care you need, when you need it.  We recommend signing up for the patient portal called "MyChart".  Sign up information is provided on this After Visit Summary.  MyChart is used to connect with patients for Virtual Visits (Telemedicine).  Patients are able to view lab/test results, encounter notes, upcoming appointments, etc.  Non-urgent messages can be sent to your provider as well.   To learn more about what you can do with MyChart, go to [email or web address].    Your next appointment:   As scheduled  Provider:   Armanda Magic, MD   Thank you for choosing [facility]!!          I,Alexis Bryant,acting as a scribe for Donato Schultz, MD.,have documented all relevant documentation on the behalf of Donato Schultz, MD,as directed by  Donato Schultz, MD while in the presence of Donato Schultz, MD.   Signed, Donato Schultz, MD  10/31/2020 4:27 PM    [facility]

## 2020-10-31 NOTE — Patient Instructions (Signed)
Medication Instructions:  The current medical regimen is effective;  continue present plan and medications.  *If you need a refill on your cardiac medications before your next appointment, please call your pharmacy*  Follow-Up: At [facility], you and your health needs are our priority.  As part of our continuing mission to provide you with exceptional heart care, we have created designated Provider Care Teams.  These Care Teams include your primary Cardiologist (physician) and Advanced Practice Providers (APPs -  Physician Assistants and Nurse Practitioners) who all work together to provide you with the care you need, when you need it.  We recommend signing up for the patient portal called "MyChart".  Sign up information is provided on this After Visit Summary.  MyChart is used to connect with patients for Virtual Visits (Telemedicine).  Patients are able to view lab/test results, encounter notes, upcoming appointments, etc.  Non-urgent messages can be sent to your provider as well.   To learn more about what you can do with MyChart, go to [email or web address].    Your next appointment:   As scheduled  Provider:   Armanda Magic, MD   Thank you for choosing [facility]!!

## 2020-11-14 ENCOUNTER — Other Ambulatory Visit: Payer: Self-pay

## 2020-12-11 ENCOUNTER — Telehealth: Payer: Self-pay | Admitting: Cardiology

## 2020-12-11 MED ORDER — ICOSAPENT ETHYL 1 G PO CAPS: 2.0000 g | ORAL_CAPSULE | Freq: Two times a day (BID) | ORAL | 3 refills | Status: DC

## 2020-12-11 MED ORDER — FENOFIBRATE 48 MG PO TABS: 48.0000 mg | ORAL_TABLET | Freq: Every day | ORAL | 3 refills | Status: DC

## 2020-12-11 NOTE — Telephone Encounter (Signed)
Pt states he was looking at MyChart and noticed that he has Vascepa and Fenofibrate on his list but he never started these medications.  Advised pt these were sent in back in April.  He states they were never filled. Advised I will send in prescription again and move his labs out.  Labs moved to 01/26/21.  Pt appreciative for call.

## 2020-12-11 NOTE — Telephone Encounter (Signed)
Colton [name] is calling requesting to speak with a nurse in regards to his medications. He states there has been a mix up with the pharmacy. Please advise.

## 2020-12-14 ENCOUNTER — Other Ambulatory Visit: Payer: Self-pay

## 2021-01-02 ENCOUNTER — Telehealth: Payer: Self-pay | Admitting: Family Medicine

## 2021-01-02 MED ORDER — COLCHICINE 0.6 MG PO TABS: 0.3000 mg | ORAL_TABLET | Freq: Every day | ORAL | 0 refills | Status: DC | PRN

## 2021-01-02 MED ORDER — HYDROCODONE-ACETAMINOPHEN 5-325 MG PO TABS: 1.0000 | ORAL_TABLET | Freq: Four times a day (QID) | ORAL | 0 refills | Status: DC | PRN

## 2021-01-02 MED ORDER — PREDNISONE 10 MG PO TABS: ORAL_TABLET | ORAL | 0 refills | Status: DC

## 2021-01-02 NOTE — Telephone Encounter (Signed)
Sent. Thanks.  Needs Dm2 f/u with me soon.  Overdue.

## 2021-01-02 NOTE — Telephone Encounter (Signed)
Patient call in stated he's in a lot of pain with Gout . No appointment available today requesting refills    Encourage patient to contact the pharmacy for refills or they can request refills through [facility]  LAST APPOINTMENT DATE:  Please schedule appointment if longer than 1 year  NEXT APPOINTMENT DATE:  MEDICATION:HYDROcodone-acetaminophen (NORCO/VICODIN) 5-325 MG tablet predniSONE (DELTASONE) 10 MG table colchicine 0.6 MG table Is the patient out of medication?   PHARMACY:[facility] [id number] -  Let patient know to contact pharmacy at the end of the day to make sure medication is ready.  Please notify patient to allow 48-72 hours to process  CLINICAL FILLS OUT ALL BELOW:   LAST REFILL:  QTY: REFILL DATE:    OTHER COMMENTS:    Okay for refill?  Please advise

## 2021-01-02 NOTE — Telephone Encounter (Signed)
Patient requesting refills on Hydrocodone, prednisone and colchicine for gout flare. Patient has appt tomorrow with Dr. Patsy Lager.

## 2021-01-02 NOTE — Addendum Note (Signed)
Addended by: Joaquim Nam on: 01/02/2021 06:16 PM   Modules accepted: Orders

## 2021-01-03 ENCOUNTER — Ambulatory Visit: Payer: Self-pay | Admitting: Family Medicine

## 2021-01-09 NOTE — Telephone Encounter (Signed)
Patient is scheduled   

## 2021-01-15 ENCOUNTER — Telehealth: Payer: Self-pay

## 2021-01-15 ENCOUNTER — Other Ambulatory Visit: Payer: Self-pay

## 2021-01-15 ENCOUNTER — Ambulatory Visit (INDEPENDENT_AMBULATORY_CARE_PROVIDER_SITE_OTHER): Payer: Self-pay | Admitting: Family Medicine

## 2021-01-15 ENCOUNTER — Encounter: Payer: Self-pay | Admitting: Family Medicine

## 2021-01-15 VITALS — BP 110/64 | HR 79 | Temp 97.6°F | Ht 72.0 in | Wt 305.0 lb

## 2021-01-15 DIAGNOSIS — L989 Disorder of the skin and subcutaneous tissue, unspecified: Secondary | ICD-10-CM

## 2021-01-15 DIAGNOSIS — E119 Type 2 diabetes mellitus without complications: Secondary | ICD-10-CM

## 2021-01-15 DIAGNOSIS — M109 Gout, unspecified: Secondary | ICD-10-CM

## 2021-01-15 LAB — COMPREHENSIVE METABOLIC PANEL
ALT: 39 U/L (ref 0–53)
AST: 22 U/L (ref 0–37)
Albumin: 3.9 g/dL (ref 3.5–5.2)
Alkaline Phosphatase: 61 U/L (ref 39–117)
BUN: 20 mg/dL (ref 6–23)
CO2: 27 mEq/L (ref 19–32)
Calcium: 8.9 mg/dL (ref 8.4–10.5)
Chloride: 101 mEq/L (ref 96–112)
Creatinine, Ser: 1.29 mg/dL (ref 0.40–1.50)
GFR: 58.09 mL/min — ABNORMAL LOW (ref >60.00)
Glucose, Bld: 183 mg/dL — ABNORMAL HIGH (ref 70–99)
Potassium: 4.6 mEq/L (ref 3.5–5.1)
Sodium: 136 mEq/L (ref 135–145)
Total Bilirubin: 0.8 mg/dL (ref 0.2–1.2)
Total Protein: 6.7 g/dL (ref 6.0–8.3)

## 2021-01-15 LAB — LIPID PANEL
Cholesterol: 122 mg/dL (ref 0–200)
HDL: 30.7 mg/dL — ABNORMAL LOW (ref >39.00)
NonHDL: 91.06
Total CHOL/HDL Ratio: 4
Triglycerides: 289 mg/dL — ABNORMAL HIGH (ref 0.0–149.0)
VLDL: 57.8 mg/dL — ABNORMAL HIGH (ref 0.0–40.0)

## 2021-01-15 LAB — LDL CHOLESTEROL, DIRECT: Direct LDL: 65 mg/dL

## 2021-01-15 LAB — URIC ACID: Uric Acid, Serum: 9.5 mg/dL — ABNORMAL HIGH (ref 4.0–7.8)

## 2021-01-15 LAB — HEMOGLOBIN A1C: Hgb A1c MFr Bld: 8.1 % — ABNORMAL HIGH (ref 4.6–6.5)

## 2021-01-15 MED ORDER — FISH OIL 1000 MG PO CAPS: 2000.0000 mg | ORAL_CAPSULE | Freq: Every day | ORAL | 0 refills | Status: DC

## 2021-01-15 MED ORDER — PREDNISONE 10 MG PO TABS: ORAL_TABLET | ORAL | 0 refills | Status: DC

## 2021-01-15 MED ORDER — HYDROCODONE-ACETAMINOPHEN 5-325 MG PO TABS: 1.0000 | ORAL_TABLET | Freq: Four times a day (QID) | ORAL | 0 refills | Status: DC | PRN

## 2021-01-15 MED ORDER — CEPHALEXIN 500 MG PO CAPS: 500.0000 mg | ORAL_CAPSULE | Freq: Three times a day (TID) | ORAL | 0 refills | Status: DC

## 2021-01-15 NOTE — Patient Instructions (Addendum)
Go to the lab on the way out.   If you have mychart we'll likely use that to update you.    Take care.  Glad to see you. Let me know if the fish oil listed doesn't match your home dose.    Use keflex if your arm isn't healing with neosporin and a bandaid.

## 2021-01-15 NOTE — Telephone Encounter (Signed)
Pt called back to say he is omega 3 1000 mg takes 2 by mouth daily.

## 2021-01-15 NOTE — Progress Notes (Signed)
This visit occurred during the SARS-CoV-2 public health emergency.  Safety protocols were in place, including screening questions prior to the visit, additional usage of staff PPE, and extensive cleaning of exam room while observing appropriate contact time as indicated for disinfecting solutions.  Diabetes:  No meds.  Hypoglycemic episodes: no sx  Hyperglycemic episodes: no sx Feet problems: no Blood Sugars averaging: not checked.   eye exam within last year: due, d/w pt.   Labs pending.  Diet and exercise d/w pt.  Walking a day.    H/o gout.  Never started allopurinol.  D/w pt.  Rare use of colchicine.  Off fenofibrate and vascepa.  Had gout flares with those meds.  D/w pt.  Most recent flare was about 2 weeks ago.  D/w pt about colchicine cautions.    1cm scabbed lesion on the L forearm.  He scraped it about 1 weeks ago.  Minimal local pinkish changes on the rim of the lesion.    Meds, vitals, and allergies reviewed.   ROS: Per HPI unless specifically indicated in ROS section   GEN: nad, alert and oriented HEENT: ncat NECK: supple w/o LA CV: rrr. PULM: ctab, no inc wob ABD: soft, +bs EXT: no edema SKIN: no acute rash, 1cm scabbed lesion on the left forearm with minimal pinkish change around age.  No spreading erythema.  No fluctuance.  No discharge.  Diabetic foot exam: Normal inspection No skin breakdown No calluses  Normal DP pulses Normal sensation to light touch and monofilament Nails normal

## 2021-01-16 ENCOUNTER — Ambulatory Visit (INDEPENDENT_AMBULATORY_CARE_PROVIDER_SITE_OTHER): Payer: Self-pay | Admitting: Family Medicine

## 2021-01-16 ENCOUNTER — Encounter: Payer: Self-pay | Admitting: Family Medicine

## 2021-01-16 VITALS — BP 122/64 | HR 81 | Temp 97.3°F | Wt 304.0 lb

## 2021-01-16 DIAGNOSIS — M109 Gout, unspecified: Secondary | ICD-10-CM

## 2021-01-16 DIAGNOSIS — E119 Type 2 diabetes mellitus without complications: Secondary | ICD-10-CM

## 2021-01-16 DIAGNOSIS — E785 Hyperlipidemia, unspecified: Secondary | ICD-10-CM

## 2021-01-16 DIAGNOSIS — Z1211 Encounter for screening for malignant neoplasm of colon: Secondary | ICD-10-CM

## 2021-01-16 MED ORDER — FISH OIL 1000 MG PO CAPS: 2000.0000 mg | ORAL_CAPSULE | Freq: Two times a day (BID) | ORAL | 0 refills | Status: DC

## 2021-01-16 MED ORDER — ALLOPURINOL 100 MG PO TABS: ORAL_TABLET | ORAL | Status: DC

## 2021-01-16 NOTE — Telephone Encounter (Signed)
Noted.  That is listed in EMR.  Thanks.

## 2021-01-16 NOTE — Patient Instructions (Addendum)
1/2 tab allopurinol daily for 1 month then 1 tab a day thereafter.  Recheck labs in about 3 months before a visit.   Continue 2 tabs twice a day with fish oil.  I'll check with cardiology.  Take care.  Glad to see you.  Use the eat right diet.

## 2021-01-16 NOTE — Progress Notes (Signed)
This visit occurred during the SARS-CoV-2 public health emergency.  Safety protocols were in place, including screening questions prior to the visit, additional usage of staff PPE, and extensive cleaning of exam room while observing appropriate contact time as indicated for disinfecting solutions.  Diabetes:  Recent labs d/w pt. not on antidiabetic medication currently.  Diet and exercise discussed with patient.  Low carbohydrate diet discussed with patient in detail.  Handout given.  On ARB and statin.   He had only been on fish oil 2000mg  QD instead of 2000mg  BID.  D/w pt.  No h/o ADE on 4000mg  total daily dose.    He is off vascepa and he attributed prev gout sx to that.  Discussed gout prevention options, especially allopurinol with plan for gradual increase in dose given his recent uric acid level.  I asked him not to stop aspirin unless specifically advised to do so by cardiology.    D/w patient for colon cancer screening, including IFOB vs. colonoscopy.  Risks and benefits of both were discussed and patient voiced understanding.  Pt elects for cologuard.    Meds, vitals, and allergies reviewed.   ROS: Per HPI unless specifically indicated in ROS section   GEN: nad, alert and oriented HEENT: ncat NECK: supple w/o LA CV: rrr. PULM: ctab, no inc wob EXT: no edema SKIN: no acute rash, but lesion on left forearm appears to be healing.  See previous office visit note.  No spreading erythema.

## 2021-01-17 ENCOUNTER — Telehealth: Payer: Self-pay | Admitting: Family Medicine

## 2021-01-17 DIAGNOSIS — L989 Disorder of the skin and subcutaneous tissue, unspecified: Secondary | ICD-10-CM | POA: Insufficient documentation

## 2021-01-17 MED ORDER — METOPROLOL SUCCINATE ER 25 MG PO TB24: 25.0000 mg | ORAL_TABLET | Freq: Every day | ORAL | 3 refills | Status: DC

## 2021-01-17 NOTE — Addendum Note (Signed)
Addended by: Joaquim Nam on: 01/17/2021 08:43 PM   Modules accepted: Orders

## 2021-01-17 NOTE — Assessment & Plan Note (Addendum)
History of, see notes on labs.  Colchicine cautions discussed with patient, especially regarding carvedilol.  Discussed using hydrocodone and prednisone only if he had significant gout flare.  Routine cautions given to patient.

## 2021-01-17 NOTE — Assessment & Plan Note (Signed)
Begin allopurinol at 50 mg a day.  He can gradually increase to 100 mg.  See after visit summary.  We can recheck his labs later on.

## 2021-01-17 NOTE — Telephone Encounter (Signed)
Notify pt.  Contacted Dr. Mayford Knife, she agrees with the plan to change to metoprolol.  Rx sent for 25mg  tab.  Take 1 a day.  Stop carvedilol.  Update me about his BP and pulse in about 10 days.  We may need to inc the dose to 50mg  daily.  Thanks.

## 2021-01-17 NOTE — Assessment & Plan Note (Signed)
He is going to try the higher dose of facial with 2000 mg twice daily.  We can recheck his labs later at follow-up visit.

## 2021-01-17 NOTE — Assessment & Plan Note (Signed)
He will use eat right diet and we specifically talked about follow-up in approximately 3 months, around the end of November.  We can recheck labs at that point.  See orders.

## 2021-01-17 NOTE — Assessment & Plan Note (Signed)
Scabbed area where he had scraped it previously.  I expected to heal.  He can use a Band-Aid and Neosporin.  If he has spreading erythema then start Keflex and let me know.

## 2021-01-17 NOTE — Telephone Encounter (Signed)
Please see my office visit notes from this week and also his recent labs.  He is going to increase fish oil to 2000 units twice daily in the meantime.  We will recheck his labs later on. He is go to work on his diet to help with diabetes. He is starting allopurinol to try to prevent gout attacks.  I would like to change his carvedilol to a beta-blocker that will not interact with colchicine.  Are you willing to change his beta-blocker to metoprolol or do you object to the change?  Please let me know.

## 2021-01-17 NOTE — Assessment & Plan Note (Signed)
Cologuard ordered

## 2021-01-17 NOTE — Assessment & Plan Note (Signed)
History of, no meds currently.  Discussed with patient about diet and exercise.  Walking 30 minutes a day.  See notes on labs.

## 2021-01-18 MED ORDER — METOPROLOL SUCCINATE ER 25 MG PO TB24: 25.0000 mg | ORAL_TABLET | Freq: Every day | ORAL | 3 refills | Status: DC

## 2021-01-18 NOTE — Telephone Encounter (Signed)
Patient notified about medication change. Rx was sent to wrong pharmacy. Patient requested rx be sent to [facility] in [place]. Rx sent. Advised patient to call back with BP and Pulse readings in 10 days.

## 2021-01-18 NOTE — Addendum Note (Signed)
Addended by: Wendie Simmer B on: 01/18/2021 11:39 AM   Modules accepted: Orders

## 2021-01-26 ENCOUNTER — Other Ambulatory Visit: Payer: Self-pay

## 2021-04-18 ENCOUNTER — Other Ambulatory Visit: Payer: Self-pay

## 2021-05-02 LAB — HM DIABETES EYE EXAM

## 2021-05-10 ENCOUNTER — Telehealth: Payer: Self-pay

## 2021-05-10 ENCOUNTER — Other Ambulatory Visit: Payer: Self-pay

## 2021-05-10 ENCOUNTER — Ambulatory Visit (INDEPENDENT_AMBULATORY_CARE_PROVIDER_SITE_OTHER): Payer: Self-pay | Admitting: Family Medicine

## 2021-05-10 VITALS — BP 126/74 | HR 70 | Temp 97.8°F | Ht 72.0 in | Wt 311.4 lb

## 2021-05-10 DIAGNOSIS — H9202 Otalgia, left ear: Secondary | ICD-10-CM

## 2021-05-10 DIAGNOSIS — K1120 Sialoadenitis, unspecified: Secondary | ICD-10-CM

## 2021-05-10 MED ORDER — AMOXICILLIN-POT CLAVULANATE 875-125 MG PO TABS: 1.0000 | ORAL_TABLET | Freq: Two times a day (BID) | ORAL | 0 refills | Status: DC

## 2021-05-10 NOTE — Telephone Encounter (Signed)
Records have been received and placed in Dr. Lianne Bushy inbox for review.

## 2021-05-10 NOTE — Telephone Encounter (Signed)
Patient had eye exam done at [facility]. I have sent over request for records.

## 2021-05-10 NOTE — Progress Notes (Signed)
Colton Heath T. [name], MD, CAQ Sports Medicine [facility] [address] [place], [address]  Phone: [phone]   FAX: [phone]  Colton [name]. - 65 y.o. male   MRN [id number]   Date of Birth: Oct 04, 1955  Date: 05/10/2021   PCP: Joaquim Nam, MD   Referral: Joaquim Nam, MD  Chief Complaint  Patient presents with   Ear Pain    C/o left ear pain.  Started 4-5 days ago. States Dr. Patsy Lager has seen pt for same sxs previously.     This visit occurred during the SARS-CoV-2 public health emergency.  Safety protocols were in place, including screening questions prior to the visit, additional usage of staff PPE, and extensive cleaning of exam room while observing appropriate contact time as indicated for disinfecting solutions.   Subjective:   Colton Gerdeman. is a 65 y.o. very pleasant male patient with Body mass index is 42.23 kg/m. who presents with the following:  L ear pain about 5 days ago.  Is a very pleasant gentleman, is the first time of ever seeing him before.  He is a patient of Dr. Lianne Bushy.  History is significant for congestive heart failure, he is chronically on Plavix.  He presents with some left-sided pain at the corner of his jaw as well as some ear pain.  Aside from this he is not really having any other chronic or systemic symptoms.  No fevers, chills, polyarthralgias.  He is eating and drinking okay.  He has no other GI symptoms or no significant pulmonary symptoms.  Review of Systems is noted in the HPI, as appropriate  Objective:   BP 126/74    Pulse 70    Temp 97.8 F (36.6 C) (Temporal)    Ht 6' (1.829 m)    Wt (!) 311 lb 6 oz (141.2 kg)    SpO2 95%    BMI 42.23 kg/m   GEN: No acute distress; alert,appropriate. PULM: Breathing comfortably in no respiratory distress PSYCH: Normally interactive.   He is noted really tender at the left corner of the jaw at the parotid gland with direct palpation at the  parotid gland itself.  The left TM is clear, as well as the right. The remainder of the oropharynx and neck exam is normal Does not have really any significant cervical adenopathy.  Laboratory and Imaging Data:  Assessment and Plan:     ICD-10-CM   1. Infection of parotid gland  K11.20     2. Ear pain, left  H92.02      Acutely infected parotid gland without other associated systemic problems. Classically, treat this with Augmentin.  Otherwise supportive care.  Meds ordered this encounter  Medications   amoxicillin-clavulanate (AUGMENTIN) 875-125 MG tablet    Sig: Take 1 tablet by mouth 2 (two) times daily.    Dispense:  20 tablet    Refill:  0   Medications Discontinued During This Encounter  Medication Reason   cephALEXin (KEFLEX) 500 MG capsule Completed Course   HYDROcodone-acetaminophen (NORCO/VICODIN) 5-325 MG tablet Completed Course   allopurinol (ZYLOPRIM) 100 MG tablet Completed Course   No orders of the defined types were placed in this encounter.   Follow-up: No follow-ups on file.  [employer] One speech-to-text software was used for transcription in this dictation.  Possible transcriptional errors can occur using Animal nutritionist.   Signed,  Elpidio Galea. Shandy Vi, MD   Outpatient Encounter Medications as of 05/10/2021  Medication  Sig   amoxicillin-clavulanate (AUGMENTIN) 875-125 MG tablet Take 1 tablet by mouth 2 (two) times daily.   aspirin 81 MG chewable tablet Chew 1 tablet (81 mg total) by mouth daily.   atorvastatin (LIPITOR) 20 MG tablet Take 1 tablet (20 mg total) by mouth daily.   clopidogrel (PLAVIX) 75 MG tablet Take 1 tablet (75 mg total) by mouth daily.   colchicine 0.6 MG tablet Take 0.5 tablets (0.3 mg total) by mouth daily as needed (for gout).   nitroGLYCERIN (NITROSTAT) 0.4 MG SL tablet Place 1 tablet (0.4 mg total) under the tongue every 5 (five) minutes as needed for chest pain.   Omega-3 Fatty Acids (FISH OIL) 1000 MG CAPS Take 2  capsules (2,000 mg total) by mouth in the morning and at bedtime.   predniSONE (DELTASONE) 10 MG tablet Take 2 a day for 5 days, then 1 a day for 5 days, with food. Don't take with aleve/ibuprofen.  Use only if colchicine doesn't help with gout.   sacubitril-valsartan (ENTRESTO) 97-103 MG Take 1 tablet by mouth 2 (two) times daily.   spironolactone (ALDACTONE) 25 MG tablet TAKE 1/2 TABLET BY MOUTH DAILY   Tetrahydrozoline HCl (VISINE OP) Place 1 drop into both eyes as needed (for dry eyes).   Vitamin D, Cholecalciferol, 25 MCG (1000 UT) CAPS Take 1,000 Units by mouth daily.   metoprolol succinate (TOPROL-XL) 25 MG 24 hr tablet Take 1 tablet (25 mg total) by mouth daily.   [DISCONTINUED] allopurinol (ZYLOPRIM) 100 MG tablet Take 1/2 tab a day for 1 month then 1 tab a day.   [DISCONTINUED] cephALEXin (KEFLEX) 500 MG capsule Take 1 capsule (500 mg total) by mouth 3 (three) times daily.   [DISCONTINUED] HYDROcodone-acetaminophen (NORCO/VICODIN) 5-325 MG tablet Take 1 tablet by mouth every 6 (six) hours as needed for severe pain (for gout).   No facility-administered encounter medications on file as of 05/10/2021.

## 2021-05-10 NOTE — Telephone Encounter (Signed)
Pt in office today seeing Dr. Patsy Lager.  Wanted to make Dr. Para March aware he had DM eye exam 2-3 wks ago and no retinopathy.

## 2021-05-12 ENCOUNTER — Encounter: Payer: Self-pay | Admitting: Family Medicine

## 2021-05-30 ENCOUNTER — Other Ambulatory Visit: Payer: Self-pay

## 2021-06-01 ENCOUNTER — Telehealth: Payer: Self-pay | Admitting: Family Medicine

## 2021-06-01 MED ORDER — AMOXICILLIN-POT CLAVULANATE 875-125 MG PO TABS: 1.0000 | ORAL_TABLET | Freq: Two times a day (BID) | ORAL | 0 refills | Status: DC

## 2021-06-01 NOTE — Telephone Encounter (Signed)
Pt is wanting a refill on his amoxicillin-clavulanate (AUGMENTIN) 875-125 MG tablet [[id number]]. He was seen by Dr. Patsy Lager on 05/10/21 for ear pain. He says the pain has come back and needs a refill.   [facility] [id number] [place], [place] - [address]  [address] [place], [place] [address]  Phone:  [phone]  Fax:  [phone]

## 2021-06-01 NOTE — Telephone Encounter (Signed)
Dr. Patsy Lager is out of the office today.  Will forward message to PCP.

## 2021-06-01 NOTE — Addendum Note (Signed)
Addended by: Joaquim Nam on: 06/01/2021 05:25 PM   Modules accepted: Orders

## 2021-06-01 NOTE — Telephone Encounter (Signed)
Rx sent.  Thanks. See mychart message.

## 2021-06-19 ENCOUNTER — Other Ambulatory Visit: Payer: Self-pay

## 2021-07-10 ENCOUNTER — Other Ambulatory Visit: Payer: Self-pay

## 2021-07-19 ENCOUNTER — Telehealth: Payer: Self-pay | Admitting: Cardiology

## 2021-07-19 ENCOUNTER — Other Ambulatory Visit: Payer: Self-pay | Admitting: *Deleted

## 2021-07-19 MED ORDER — ENTRESTO 97-103 MG PO TABS: 1.0000 | ORAL_TABLET | Freq: Two times a day (BID) | ORAL | 3 refills | Status: DC

## 2021-07-19 NOTE — Telephone Encounter (Signed)
Noted  

## 2021-07-19 NOTE — Telephone Encounter (Signed)
° °  Pt is going to drop off some paperwork and would like for Carly to call him back tomorrow to confirm if she received it

## 2021-07-20 MED ORDER — ENTRESTO 97-103 MG PO TABS: 1.0000 | ORAL_TABLET | Freq: Two times a day (BID) | ORAL | 3 refills | Status: DC

## 2021-07-20 NOTE — Telephone Encounter (Signed)
Spoke with the patient and advised that I have received his paperwork.  He dropped off patient assistance application for Entresto. He would like for the form and prescription to be faxed and he would also like a copy himself. Advised that Dr. Mayford Knife is not in the office today and that I will call him next week once she has signed the form and prescription for him to pick up.

## 2021-07-24 NOTE — Telephone Encounter (Signed)
Follow Up:       Patient says he would like for Carlyle to please give him a call on tomorrow(07-25-21).t

## 2021-07-25 NOTE — Telephone Encounter (Signed)
Left message for the patient advising that the paperwork and prescription have been signed and faxed. I have left a copy for him to come pick. Advised to cal back with any questions.  ?

## 2021-07-31 ENCOUNTER — Other Ambulatory Visit: Payer: Self-pay

## 2021-08-08 NOTE — Telephone Encounter (Signed)
**Note De-Identified Colton [name] Obfuscation** Letter received Dom Haverland fax from [employer] pt asst foundation stating that the have approved thept for asst with Entresto until 08/23/2022. ?Pt ID: [id number] ? ?The letter states that they have also notified the pt of this approval as well. ?

## 2021-08-23 ENCOUNTER — Other Ambulatory Visit: Payer: Self-pay | Admitting: Cardiology

## 2021-08-27 ENCOUNTER — Telehealth: Payer: Self-pay | Admitting: Cardiology

## 2021-08-27 ENCOUNTER — Other Ambulatory Visit (INDEPENDENT_AMBULATORY_CARE_PROVIDER_SITE_OTHER): Payer: Self-pay

## 2021-08-27 ENCOUNTER — Other Ambulatory Visit: Payer: Self-pay | Admitting: Cardiology

## 2021-08-27 ENCOUNTER — Other Ambulatory Visit: Payer: Self-pay | Admitting: *Deleted

## 2021-08-27 DIAGNOSIS — E785 Hyperlipidemia, unspecified: Secondary | ICD-10-CM

## 2021-08-27 DIAGNOSIS — E119 Type 2 diabetes mellitus without complications: Secondary | ICD-10-CM

## 2021-08-27 DIAGNOSIS — M109 Gout, unspecified: Secondary | ICD-10-CM

## 2021-08-27 LAB — URIC ACID: Uric Acid, Serum: 8.7 mg/dL — ABNORMAL HIGH (ref 4.0–7.8)

## 2021-08-27 LAB — LIPID PANEL
Cholesterol: 125 mg/dL (ref 0–200)
HDL: 27.3 mg/dL — ABNORMAL LOW (ref >39.00)
Total CHOL/HDL Ratio: 5
Triglycerides: 467 mg/dL — ABNORMAL HIGH (ref 0.0–149.0)

## 2021-08-27 LAB — HEMOGLOBIN A1C: Hgb A1c MFr Bld: 6.6 % — ABNORMAL HIGH (ref 4.6–6.5)

## 2021-08-27 LAB — LDL CHOLESTEROL, DIRECT: Direct LDL: 59 mg/dL

## 2021-08-27 MED ORDER — ENTRESTO 97-103 MG PO TABS: 1.0000 | ORAL_TABLET | Freq: Two times a day (BID) | ORAL | 3 refills | Status: DC

## 2021-08-27 MED ORDER — FISH OIL 1000 MG PO CAPS: 2000.0000 mg | ORAL_CAPSULE | Freq: Two times a day (BID) | ORAL | 0 refills | Status: DC

## 2021-08-27 MED ORDER — SPIRONOLACTONE 25 MG PO TABS: ORAL_TABLET | ORAL | 0 refills | Status: DC

## 2021-08-27 MED ORDER — ATORVASTATIN CALCIUM 20 MG PO TABS: 20.0000 mg | ORAL_TABLET | Freq: Every day | ORAL | 0 refills | Status: DC

## 2021-08-27 MED ORDER — CLOPIDOGREL BISULFATE 75 MG PO TABS: 75.0000 mg | ORAL_TABLET | Freq: Every day | ORAL | 0 refills | Status: DC

## 2021-08-27 NOTE — Telephone Encounter (Signed)
Refill has been sent in.  

## 2021-08-27 NOTE — Telephone Encounter (Signed)
Spoke with the patient who is requesting refills on his cardiac medications. He is about to run out and does not have any refills remaining. He is scheduled for his yearly follow up with Dr. Mayford Knife in June. I have sent in refills for him.  ?He reports that he stopped taking metoprolol and is now back on carvedilol 6.25 mg twice daily. He states that he is doing much better on the carvedilol. Would like to know if Dr. Mayford Knife is okay with him staying on carvedilol and if so could he have it refilled as well.  ?

## 2021-08-27 NOTE — Telephone Encounter (Signed)
Pt states that he needs to talk to nurse regarding his medications. Pt did not know exactly which medication he needed advise on ?

## 2021-09-18 ENCOUNTER — Encounter: Payer: Self-pay | Admitting: Family Medicine

## 2021-09-18 ENCOUNTER — Ambulatory Visit (INDEPENDENT_AMBULATORY_CARE_PROVIDER_SITE_OTHER): Payer: Self-pay | Admitting: Family Medicine

## 2021-09-18 VITALS — BP 122/80 | HR 68 | Temp 97.6°F | Ht 72.0 in | Wt 288.0 lb

## 2021-09-18 DIAGNOSIS — M109 Gout, unspecified: Secondary | ICD-10-CM

## 2021-09-18 DIAGNOSIS — Z1211 Encounter for screening for malignant neoplasm of colon: Secondary | ICD-10-CM

## 2021-09-18 DIAGNOSIS — E119 Type 2 diabetes mellitus without complications: Secondary | ICD-10-CM

## 2021-09-18 DIAGNOSIS — E785 Hyperlipidemia, unspecified: Secondary | ICD-10-CM

## 2021-09-18 MED ORDER — PREDNISONE 10 MG PO TABS: ORAL_TABLET | ORAL | 0 refills | Status: DC

## 2021-09-18 MED ORDER — HYDROCODONE-ACETAMINOPHEN 5-325 MG PO TABS: 1.0000 | ORAL_TABLET | Freq: Four times a day (QID) | ORAL | 0 refills | Status: DC | PRN

## 2021-09-18 MED ORDER — COLCHICINE 0.6 MG PO TABS: 0.3000 mg | ORAL_TABLET | Freq: Every day | ORAL | 0 refills | Status: DC | PRN

## 2021-09-18 MED ORDER — DOXYCYCLINE HYCLATE 100 MG PO TABS: 100.0000 mg | ORAL_TABLET | Freq: Every day | ORAL | 0 refills | Status: DC

## 2021-09-18 MED ORDER — AMOXICILLIN-POT CLAVULANATE 875-125 MG PO TABS: 1.0000 | ORAL_TABLET | Freq: Two times a day (BID) | ORAL | 0 refills | Status: DC

## 2021-09-18 NOTE — Patient Instructions (Addendum)
If you have to take colchicine, then skip lipitor that day.  ?Start doxy prior to trip to [place], 3 days prior to trip, take through and then 4 weeks after the trip.   ?Take care.  Glad to see you. ? ?If you don't get the cologuard kit in the next two weeks then let me know.   ? ?I'll update cardiology about your lipids and I suspect they will recheck your labs.  I would go in fasting.  ? ?Plan on recheck in about 6 months.  We can do labs at or before the visit.  ?

## 2021-09-18 NOTE — Progress Notes (Signed)
Diabetes:  ?No meds.   ?Hypoglycemic episodes: no sx  ?Hyperglycemic episodes: no sx ?Feet problems: no ?Blood Sugars averaging: not checked.   ?eye exam within last year: yes ?A1c is better but TG is still elevated.  He cut out sugar in the meantime.  He is taking supplements per outside clinic, d/w pt about clearing that with cards.  Intentional weight loss noted, done with diet and exercise. ? ?He has cardiology f/u pending.   ? ?Gout. Prednisone helps quickly.  Rare use of colchicine.  It helps quickly.  Early treatment helps. D/w pt about med cautions with colchicine/statin.   ? ?HLD.  TG elevation d/w pt.  LDL invalid on recent labs due to TG elevation.   ? ?He never got cologuard delivered per his report.  D/w pt, reordered.   ? ?He is travelling to [place], d/w pt about having refills for travel if needed (malaria proph vs gout vs ear infection). See AVS.   ? ?PMH and SH reviewed ? ?Meds, vitals, and allergies reviewed.  ? ?ROS: Per HPI unless specifically indicated in ROS section  ? ?GEN: nad, alert and oriented ?HEENT: ncat ?NECK: supple w/o LA ?CV: rrr. ?PULM: ctab, no inc wob ?ABD: soft, +bs ?EXT: no edema ?SKIN: no acute rash ? ?Diabetic foot exam: ?Normal inspection ?No skin breakdown ?No calluses  ?Normal DP pulses ?Normal sensation to light touch and monofilament ?Nails normal ? ?30 minutes were devoted to patient care in this encounter (this includes time spent reviewing the patient's file/history, interviewing and examining the patient, counseling/reviewing plan with patient).  ? ?

## 2021-09-19 NOTE — Assessment & Plan Note (Signed)
A1c is better but TG is still elevated.  He cut out sugar in the meantime.  He is taking supplements per outside clinic, d/w pt about clearing that with cards.  Intentional weight loss noted, done with diet and exercise.  Continue work on diet and exercise.  Recheck periodically. ? ?

## 2021-09-19 NOTE — Assessment & Plan Note (Signed)
A1c is better but TG is still elevated.  He cut out sugar in the meantime.  He is taking supplements per outside clinic, d/w pt about clearing that with cards.  Intentional weight loss noted, done with diet and exercise. ? ?He has cardiology f/u pending.   I will route this note to cardiology so he can see about getting follow-up fasting labs done at that appointment. ?

## 2021-09-19 NOTE — Assessment & Plan Note (Signed)
Continue work on diet and exercise.  No symptoms currently.  Labs discussed with patient ?Prednisone helps quickly.  Rare use of colchicine.  It helps quickly.  Early treatment helps. D/w pt about med cautions with colchicine/statin.   ?He is going [place] be [place] outside of the country so I wrote his prescription for colchicine prednisone and also hydrocodone if needed.  Routine cautions given on all those medications. ?

## 2021-09-19 NOTE — Assessment & Plan Note (Signed)
?  He never got cologuard delivered per his report.  D/w pt, reordered.  He will let me know if it is not delivered. ?

## 2021-10-09 ENCOUNTER — Ambulatory Visit (INDEPENDENT_AMBULATORY_CARE_PROVIDER_SITE_OTHER): Payer: Self-pay | Admitting: Cardiology

## 2021-10-09 ENCOUNTER — Encounter: Payer: Self-pay | Admitting: Cardiology

## 2021-10-09 VITALS — BP 118/68 | HR 69 | Ht 72.0 in | Wt 289.0 lb

## 2021-10-09 DIAGNOSIS — E785 Hyperlipidemia, unspecified: Secondary | ICD-10-CM

## 2021-10-09 DIAGNOSIS — I2510 Atherosclerotic heart disease of native coronary artery without angina pectoris: Secondary | ICD-10-CM

## 2021-10-09 DIAGNOSIS — I420 Dilated cardiomyopathy: Secondary | ICD-10-CM

## 2021-10-09 DIAGNOSIS — I10 Essential (primary) hypertension: Secondary | ICD-10-CM

## 2021-10-09 MED ORDER — ATORVASTATIN CALCIUM 20 MG PO TABS
20.0000 mg | ORAL_TABLET | Freq: Every day | ORAL | 3 refills | Status: DC
Start: 2021-10-09 — End: 2022-09-30

## 2021-10-09 MED ORDER — ENTRESTO 97-103 MG PO TABS
1.0000 | ORAL_TABLET | Freq: Two times a day (BID) | ORAL | 3 refills | Status: DC
Start: 2021-10-09 — End: 2022-11-01

## 2021-10-09 MED ORDER — CARVEDILOL 6.25 MG PO TABS: 6.2500 mg | ORAL_TABLET | Freq: Two times a day (BID) | ORAL | 3 refills | Status: DC

## 2021-10-09 MED ORDER — CLOPIDOGREL BISULFATE 75 MG PO TABS: 75.0000 mg | ORAL_TABLET | Freq: Every day | ORAL | 3 refills | Status: DC

## 2021-10-09 MED ORDER — SPIRONOLACTONE 25 MG PO TABS: ORAL_TABLET | ORAL | 3 refills | Status: DC

## 2021-10-09 NOTE — Addendum Note (Signed)
Addended by: Theresia Majors on: 10/09/2021 03:08 PM ? ? Modules accepted: Orders ? ?

## 2021-10-09 NOTE — Patient Instructions (Signed)
Medication Instructions:  ?Your physician recommends that you continue on your current medications as directed. Please refer to the Current Medication list given to you today. ? ?*If you need a refill on your cardiac medications before your next appointment, please call your pharmacy* ? ?Lab Work: ?Fasting Lipids and ALT on 5/19 between 7:15am and 5:00pm ? ?If you have labs (blood work) drawn today and your tests are completely normal, you will receive your results only by: ?MyChart Message (if you have MyChart) OR ?A paper copy in the mail ?If you have any lab test that is abnormal or we need to change your treatment, we will call you to review the results. ? ?Follow-Up: ?At [facility], you and your health needs are our priority.  As part of our continuing mission to provide you with exceptional heart care, we have created designated Provider Care Teams.  These Care Teams include your primary Cardiologist (physician) and Advanced Practice Providers (APPs -  Physician Assistants and Nurse Practitioners) who all work together to provide you with the care you need, when you need it. ? ?Your next appointment:   ?1 year(s) ? ?The format for your next appointment:   ?In Person ? ?Provider:   ?Armanda Magic, MD   ? ?Important Information About Sugar ? ? ? ? ?  ?

## 2021-10-09 NOTE — Progress Notes (Signed)
?Cardiology Office Note:   ? ?Date:  10/09/2021  ? ?ID:  Colton Bors., DOB [birth year], MRN [id number] ? ?PCP:  Colton Nam, MD  ?Cardiologist:  Armanda Magic, MD   ? ?Referring MD: Colton Nam, MD  ? ?Chief Complaint  ?Patient presents with  ? Cardiomyopathy  ? Coronary Artery Disease  ? Hypertension  ? Hyperlipidemia  ? ? ?History of Present Illness:   ? ?Colton [name]. is a 66 y.o. male with a hx of CVA 10/2015 treated with IV TPA,  moderate LV dysfunction with EF 35-40%, ASCAD with cath 02/06/16 showing 80% mid CFX lesion, treated with a DES, and a 60% LAD lesion, not significant by FFR.  The pt declined high dose statin Rx. Repeat 2D echo 04/10/2016 showed EF 35-40% with diffuse HK.  Cardiac MRI showed EF 42% with HK of the mid anterior wall and late gad uptake in the endocardium of the basal and mid anterior walls c/w prior non-transmural infarct. 2D echo done 02/2020 showed normalization of LVF with mildly thickened heart muscle and G2DD with mild AVSC. ? ?She is here today for followup and is doing well.  Shew denies any chest pain or pressure, SOB, DOE, PND, orthopnea, LE edema, dizziness, palpitations or syncope. She is compliant with her meds and is tolerating meds with no SE.    ? ?Past Medical History:  ?Diagnosis Date  ? Coronary artery disease   ? cath 01/2016 with 60% LAD with FFR 0.82 and 80% mid LCX s/p PCI  ? Diabetes mellitus without complication (HCC)   ? Dilated cardiomyopathy (HCC)   ? EF 35-40% by [id number] and 42% by cardiac MR.  EF normalized on echo 02/2020  ? Discoid lupus   ? Fatty liver   ? Former smoker   ? Gout 07/2001  ? [facility]  ? History of gout   ? Hyperlipidemia LDL goal <70   ? Hypertension   ? Rosacea   ? Stroke [facility])   ? s/p tpa 2017 w/o residual defecit  ? ? ?Past Surgical History:  ?Procedure Laterality Date  ? CARDIAC CATHETERIZATION N/A 02/06/2016  ? Procedure: Left Heart Cath and Coronary Angiography;  Surgeon: Corky Crafts, MD;  Location: [facility]  INVASIVE CV LAB;  Service: Cardiovascular;  Laterality: N/A;  ? CARDIAC CATHETERIZATION N/A 02/06/2016  ? Procedure: Coronary Stent Intervention;  Surgeon: Corky Crafts, MD;  Location[facility] INVASIVE CV LAB;  Service: Cardiovascular;  Laterality: N/A;  ? CARDIAC CATHETERIZATION N/A 02/06/2016  ? Procedure: Intravascular Pressure Wire/FFR Study;  Surgeon: Corky Crafts, MD;  Location: [facility] INVASIVE CV LAB;  Service: Cardiovascular;  Laterality: N/A;  ? Cardiolite  08/16/2008  ? [facility]  ? ? ?Current Medications: ?Current Meds  ?Medication Sig  ? amoxicillin-clavulanate (AUGMENTIN) 875-125 MG tablet Take 1 tablet by mouth 2 (two) times daily.  ? aspirin 81 MG chewable tablet Chew 1 tablet (81 mg total) by mouth daily.  ? atorvastatin (LIPITOR) 20 MG tablet Take 1 tablet (20 mg total) by mouth daily.  ? carvedilol (COREG) 6.25 MG tablet TAKE ONE TABLET BY MOUTH TWICE A DAY  ? clopidogrel (PLAVIX) 75 MG tablet Take 1 tablet (75 mg total) by mouth daily.  ? colchicine 0.6 MG tablet Take 0.5 tablets (0.3 mg total) by mouth daily as needed (for gout).  ? doxycycline (VIBRA-TABS) 100 MG tablet Take 1 tablet (100 mg total) by mouth daily.  ? HYDROcodone-acetaminophen (NORCO/VICODIN) 5-325 MG tablet Take 1 tablet by mouth  every 6 (six) hours as needed for severe pain (for gout).  ? nitroGLYCERIN (NITROSTAT) 0.4 MG SL tablet Place 1 tablet (0.4 mg total) under the tongue every 5 (five) minutes as needed for chest pain.  ? Omega-3 Fatty Acids (FISH OIL) 1000 MG CAPS Take 2 capsules (2,000 mg total) by mouth in the morning and at bedtime.  ? predniSONE (DELTASONE) 10 MG tablet Take 2 a day for 5 days, then 1 a day for 5 days, with food. Don't take with aleve/ibuprofen.  Use only if colchicine doesn't help with gout.  ? sacubitril-valsartan (ENTRESTO) 97-103 MG Take 1 tablet by mouth 2 (two) times daily.  ? spironolactone (ALDACTONE) 25 MG tablet TAKE 1/2 TABLET BY MOUTH DAILY  ? Tetrahydrozoline HCl (VISINE OP)  Place 1 drop into both eyes as needed (for dry eyes).  ? Vitamin D, Cholecalciferol, 25 MCG (1000 UT) CAPS Take 1,000 Units by mouth daily.  ?  ? ?Allergies:   Iodine and Latex  ? ?Social History  ? ?Socioeconomic History  ? Marital status: Widowed  ?  Spouse name: Not on file  ? Number of children: 0  ? Years of education: Not on file  ? Highest education level: Not on file  ?Occupational History  ? 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 Smoking status: Former  ?  Packs/day: 2.00  ?  Years: 45.00  ?  Pack years: 90.00  ?  Types: Cigarettes  ?  Quit date: 10/28/2015  ?  Years since quitting: 5.9  ? Smokeless tobacco: Never  ?Vaping Use  ? Vaping Use: Never used  ?Substance and Sexual Activity  ? Alcohol use: Yes  ?  Alcohol/week: 2.0 standard drinks  ?  Types: 1 Glasses of wine, 1 Shots of liquor per week  ? Drug use: No  ? Sexual activity: Not Currently  ?Other Topics Concern  ? Not on file  ?Social History Narrative  ? [employer] '78-'79  ? Prev investment banking, now trades crude oil internationally  ? Widowed, wife died of mesothelioma  ? No kids  ? ?Social Determinants of Health  ? ?Financial Resource Strain: Not on file  ?Food Insecurity: Not on file  ?Transportation Needs: Not on file  ?Physical Activity: Not on file  ?Stress: Not on file  ?Social Connections: Not on file  ?  ? ?Family History: ?The patient's family history includes CAD in his father and another family member; Heart disease in his father and another family member; Lung cancer in an other family member. There is no history of Colon cancer or Prostate cancer. ? ?ROS:   ?Please see the history of present illness.    ?ROS  ?All other systems reviewed and negative.  ? ?EKGs/Labs/Other Studies Reviewed:   ? ?The following studies were reviewed today: ?none ? ?EKG:  EKG is  ordered today.  The ekg ordered today demonstrates NSR with nonspecific ST abnormality ? ?Recent Labs: ?01/15/2021: ALT 39; BUN 20; Creatinine, Ser 1.29;  Potassium 4.6; Sodium 136  ? ?Recent Lipid Panel ?   ?Component Value Date/Time  ? CHOL 125 08/27/2021 0802  ? CHOL 133 08/24/2020 0712  ? TRIG (H) 08/27/2021 0802  ?  467.0 Triglyceride is over 400; calculations on Lipids are invalid.  ? HDL 27.30 (L) 08/27/2021 0802  ? HDL 25 (L) 08/24/2020 [id number]  ? CHOLHDL 5 08/27/2021 0802  ? VLDL 57.8 (H) 01/15/2021 1047  ? LDLCALC 57 08/24/2020 0712  ? LDLDIRECT 59.0 08/27/2021 0802  ? ? ?  Physical Exam:   ? ?VS:  BP 118/68   Pulse 69   Ht 6' (1.829 m)   Wt 289 lb (131.1 kg)   SpO2 98%   BMI 39.20 kg/m?    ? ?Wt Readings from Last 3 Encounters:  ?10/09/21 289 lb (131.1 kg)  ?09/18/21 288 lb (130.6 kg)  ?05/10/21 (!) 311 lb 6 oz (141.2 kg)  ?  ? ?GEN: Well nourished, well developed in no acute distress ?HEENT: Normal ?NECK: No JVD; No carotid bruits ?LYMPHATICS: No lymphadenopathy ?CARDIAC:RRR, no murmurs, rubs, gallops ?RESPIRATORY:  Clear to auscultation without rales, wheezing or rhonchi  ?ABDOMEN: Soft, non-tender, non-distended ?MUSCULOSKELETAL:  No edema; No deformity  ?SKIN: Warm and dry ?NEUROLOGIC:  Alert and oriented x 3 ?PSYCHIATRIC:  Normal affect   ? ?ASSESSMENT:   ? ?1. DCM (dilated cardiomyopathy) (HCC)   ?2. Coronary artery disease involving native coronary artery of native heart without angina pectoris   ?3. Primary hypertension   ?4. Hyperlipidemia with target LDL less than 70   ? ? ?PLAN:   ? ?In order of problems listed above: ? ?1.  DCM  ?-This is likely mixed ischemic/nonischemic.   ?- EF 35-40% by echo 2017 and 42% by cardiac MRI 05/2016.   ?- EF normalized at 60-65% by echo 02/2020 ?- He does not appear volume overloaded on exam ?-Continue prescription drug management with Entresto 97-23 mg twice daily and carvedilol 6.25 mg twice daily as well as spironolactone 12.5 mg daily with as needed refills ?- check CMET ?  ?2.  ASCAD  ?- cath 02/06/16 showing 80% mid CFX lesion, treated with a DES, and a 60% LAD lesion, not significant by FFR.  ?-He has not had  any anginal symptoms since I saw him last ?-Continue prescription drug management with aspirin 81 mg daily, Plavix 75 mg daily and carvedilol 6.25 mg twice daily with as needed refills  ? ?3 .  HTN  ?-BP is

## 2021-10-12 ENCOUNTER — Other Ambulatory Visit: Payer: Self-pay | Admitting: *Deleted

## 2021-10-12 DIAGNOSIS — I10 Essential (primary) hypertension: Secondary | ICD-10-CM

## 2021-10-12 DIAGNOSIS — I420 Dilated cardiomyopathy: Secondary | ICD-10-CM

## 2021-10-12 DIAGNOSIS — I2510 Atherosclerotic heart disease of native coronary artery without angina pectoris: Secondary | ICD-10-CM

## 2021-10-12 DIAGNOSIS — E785 Hyperlipidemia, unspecified: Secondary | ICD-10-CM

## 2021-10-12 LAB — COMPREHENSIVE METABOLIC PANEL
ALT: 22 IU/L (ref 0–44)
AST: 21 IU/L (ref 0–40)
Albumin/Globulin Ratio: 2 (ref 1.2–2.2)
Albumin: 4.5 g/dL (ref 3.8–4.8)
Alkaline Phosphatase: 62 IU/L (ref 44–121)
BUN/Creatinine Ratio: 16 (ref 10–24)
BUN: 18 mg/dL (ref 8–27)
Bilirubin Total: 0.4 mg/dL (ref 0.0–1.2)
CO2: 22 mmol/L (ref 20–29)
Calcium: 9 mg/dL (ref 8.6–10.2)
Chloride: 103 mmol/L (ref 96–106)
Creatinine, Ser: 1.16 mg/dL (ref 0.76–1.27)
Globulin, Total: 2.2 g/dL (ref 1.5–4.5)
Glucose: 101 mg/dL — ABNORMAL HIGH (ref 70–99)
Potassium: 4.7 mmol/L (ref 3.5–5.2)
Sodium: 140 mmol/L (ref 134–144)
Total Protein: 6.7 g/dL (ref 6.0–8.5)
eGFR: 69 mL/min/{1.73_m2} (ref >59)

## 2021-10-12 LAB — LIPID PANEL
Chol/HDL Ratio: 3.8 ratio (ref 0.0–5.0)
Cholesterol, Total: 129 mg/dL (ref 100–199)
HDL: 34 mg/dL — ABNORMAL LOW (ref >39)
LDL Chol Calc (NIH): 69 mg/dL (ref 0–99)
Triglycerides: 150 mg/dL — ABNORMAL HIGH (ref 0–149)
VLDL Cholesterol Cal: 26 mg/dL (ref 5–40)

## 2021-10-17 ENCOUNTER — Other Ambulatory Visit: Payer: Self-pay | Admitting: Cardiology

## 2021-10-17 MED ORDER — FISH OIL 1000 MG PO CAPS: 2000.0000 mg | ORAL_CAPSULE | Freq: Two times a day (BID) | ORAL | 3 refills | Status: DC

## 2021-10-18 ENCOUNTER — Telehealth: Payer: Self-pay | Admitting: Cardiology

## 2021-10-18 ENCOUNTER — Encounter: Payer: Self-pay | Admitting: Pharmacist

## 2021-10-18 NOTE — Telephone Encounter (Signed)
Patient would like to know if he can take NP-thyroid 15 mg. He was prescribed this by another provider and states that he read that there are concerns for people taking it that have cardiovascular disease and borderline diabetes. Advised that I would check with our PharmD team

## 2021-10-18 NOTE — Telephone Encounter (Signed)
Asking that the nurse gives him a call to discuss his medication. Please advise

## 2021-10-18 NOTE — Telephone Encounter (Signed)
I spoke with patient. He states that Roni Bread was starting him on it. I asked him if his labs showed low thyroid levels. He thought so. I advised that if its being used to supplement low thyroid levels and that those levels are monitored closely than generally it is safe to use. Patient will get the labs and send them to [place] next week. He would like to discuss again with [place] after labs come back.

## 2021-11-23 ENCOUNTER — Ambulatory Visit: Payer: Self-pay | Admitting: Cardiology

## 2022-07-12 ENCOUNTER — Telehealth: Payer: Self-pay

## 2022-07-12 NOTE — Telephone Encounter (Signed)
**Note De-Identified Jonmichael Beadnell Obfuscation** We received a Entresto RX form from [employer].  I have completed the form and have e-mailed it to the nurse working with Dr Radford Pax today so she can obtain her signature, date it, and to then fax it back to [employer] at the fax number circled at the top of the page.

## 2022-07-14 ENCOUNTER — Other Ambulatory Visit: Payer: Self-pay | Admitting: Cardiology

## 2022-07-16 MED ORDER — FISH OIL 1000 MG PO CAPS: 2000.0000 mg | ORAL_CAPSULE | Freq: Two times a day (BID) | ORAL | 0 refills | Status: DC

## 2022-07-19 NOTE — Telephone Encounter (Signed)
**Note De-Identified Scarlett Portlock Obfuscation** We received another Entresto 97-103 mg RX form from [employer].  I have completed the form and have faxed it to the nurse working with Dr Radford Pax today so she can obtain her signature, date it, and to fax back to [employer] at the fax number circled at the top of the page.

## 2022-07-22 ENCOUNTER — Telehealth: Payer: Self-pay | Admitting: Cardiology

## 2022-07-22 NOTE — Telephone Encounter (Signed)
Patient called to state he would bring forms for Entresto by to be complete. Advised to bring by reception and they would be directed to the appropriate person.

## 2022-07-22 NOTE — Telephone Encounter (Signed)
Pt is requesting to speak to Dr. Theodosia Blender RN. Did not wish to disclose what it is about. Informed him she is not in today, will have her call when she returns.

## 2022-07-23 ENCOUNTER — Other Ambulatory Visit: Payer: Self-pay

## 2022-07-23 NOTE — Telephone Encounter (Addendum)
**Note De-Identified Chael Urenda Obfuscation** I did receive the signed by Dr Radford Pax providers page only of the pts NPAF application for [facility] assistance and have faxed it to NPAF [employer]. Fax: Tx 'ok' Report CONE_EMAIL-to-Fax Cypher Paule, Mardene Celeste This message was sent [facility], a product from [employer] [email or web address]                    -------Fax Transmission Report-------  To:               Recipient at [id number] Subject:          Fw: Scan from [place] Result:           The transmission was successful. Explanation:      All Pages Ok Pages Sent:       2 Connect Time:     1 minutes, 17 seconds Transmit Time:    07/23/2022 14:08 Transfer Rate:    14400 Status Code:      0000 Retry Count:      0 Job Id:           [id number] Unique [id number] Fax Line:         11 Fax Server:       [employer]

## 2022-08-01 LAB — HM DIABETES EYE EXAM

## 2022-08-29 NOTE — Telephone Encounter (Signed)
**Note De-Identified Colton [name] Obfuscation** Letter received from NPAF Kofi Murrell fax stating that they have approved the pt for Entresto assistance until 08/23/2023. Pt ID: [id number]  The letter states that they have notified the pt of this approval as well.

## 2022-09-30 ENCOUNTER — Other Ambulatory Visit: Payer: Self-pay | Admitting: Cardiology

## 2022-10-10 ENCOUNTER — Other Ambulatory Visit: Payer: Self-pay | Admitting: Cardiology

## 2022-11-01 ENCOUNTER — Encounter: Payer: Self-pay | Admitting: Cardiology

## 2022-11-01 ENCOUNTER — Ambulatory Visit: Payer: Self-pay | Attending: Cardiology | Admitting: Cardiology

## 2022-11-01 VITALS — BP 100/60 | HR 70 | Ht 72.0 in | Wt 313.6 lb

## 2022-11-01 DIAGNOSIS — I10 Essential (primary) hypertension: Secondary | ICD-10-CM

## 2022-11-01 DIAGNOSIS — R0600 Dyspnea, unspecified: Secondary | ICD-10-CM

## 2022-11-01 DIAGNOSIS — I420 Dilated cardiomyopathy: Secondary | ICD-10-CM

## 2022-11-01 DIAGNOSIS — Z79899 Other long term (current) drug therapy: Secondary | ICD-10-CM

## 2022-11-01 DIAGNOSIS — E785 Hyperlipidemia, unspecified: Secondary | ICD-10-CM

## 2022-11-01 DIAGNOSIS — I2510 Atherosclerotic heart disease of native coronary artery without angina pectoris: Secondary | ICD-10-CM

## 2022-11-01 MED ORDER — ENTRESTO 97-103 MG PO TABS: 1.0000 | ORAL_TABLET | Freq: Two times a day (BID) | ORAL | 3 refills | Status: DC

## 2022-11-01 MED ORDER — CLOPIDOGREL BISULFATE 75 MG PO TABS: 75.0000 mg | ORAL_TABLET | Freq: Every day | ORAL | 3 refills | Status: DC

## 2022-11-01 MED ORDER — OMEGA-3-ACID ETHYL ESTERS 1 G PO CAPS: ORAL_CAPSULE | ORAL | 0 refills | Status: DC

## 2022-11-01 MED ORDER — CARVEDILOL 6.25 MG PO TABS: 6.2500 mg | ORAL_TABLET | Freq: Two times a day (BID) | ORAL | 3 refills | Status: DC

## 2022-11-01 MED ORDER — ATORVASTATIN CALCIUM 20 MG PO TABS: 20.0000 mg | ORAL_TABLET | Freq: Every day | ORAL | 3 refills | Status: DC

## 2022-11-01 MED ORDER — SPIRONOLACTONE 25 MG PO TABS: ORAL_TABLET | ORAL | 3 refills | Status: DC

## 2022-11-01 NOTE — Progress Notes (Signed)
Cardiology Office Note:    Date:  11/01/2022   ID:  Colton [name]., DOB 1956-01-19, MRN [id number]  PCP:  Joaquim Nam, MD  Cardiologist:  Armanda Magic, MD    Referring MD: Joaquim Nam, MD   Chief Complaint  Patient presents with   Coronary Artery Disease   Hypertension   Hyperlipidemia   Cardiomyopathy    History of Present Illness:    Colton [name]. is a 67 y.o. male with a hx of CVA 10/2015 treated with IV TPA,  moderate LV dysfunction with EF 35-40%, ASCAD with cath 02/06/16 showing 80% mid CFX lesion, treated with a DES, and a 60% LAD lesion, not significant by FFR.  The pt declined high dose statin Rx.   Repeat 2D echo 04/10/2016 showed EF 35-40% with diffuse HK.  Cardiac MRI showed EF 42% with HK of the mid anterior wall and late gad uptake in the endocardium of the basal and mid anterior walls c/w prior non-transmural infarct. 2D echo done 02/2020 showed normalization of LVF with mildly thickened heart muscle and G2DD with mild AVSC.  He is here today for followup and is doing well.  His main complaint today is that he has been having SOB.  It mainly is when he is carrying heavy things but not with routine activity. He says that his SOB is chronic and has not really change from before and thinks that it likely is related to his weight  He is interested in going on a weight loss drug.  He denies any chest pain or pressure, PND, orthopnea, LE edema, dizziness, palpitations or syncope. He is compliant with his meds and is tolerating meds with no SE.      Past Medical History:  Diagnosis Date   Coronary artery disease    cath 01/2016 with 60% LAD with FFR 0.82 and 80% mid LCX s/p PCI   Diabetes mellitus without complication (HCC)    Dilated cardiomyopathy (HCC)    EF 35-40% by [id number] and 42% by cardiac MR.  EF normalized on echo 02/2020   Discoid lupus    Fatty liver    Former smoker    Gout 07/2001   [facility]   History of gout    Hyperlipidemia  LDL goal <70    Hypertension    Rosacea    Stroke [facility])    s/p tpa 2017 w/o residual defecit    Past Surgical History:  Procedure Laterality Date   CARDIAC CATHETERIZATION N/A 02/06/2016   Procedure: Left Heart Cath and Coronary Angiography;  Surgeon: Corky Crafts, MD;  Location: [facility] INVASIVE CV LAB;  Service: Cardiovascular;  Laterality: N/A;   CARDIAC CATHETERIZATION N/A 02/06/2016   Procedure: Coronary Stent Intervention;  Surgeon: Corky Crafts, MD;  Location: [facility] INVASIVE CV LAB;  Service: Cardiovascular;  Laterality: N/A;   CARDIAC CATHETERIZATION N/A 02/06/2016   Procedure: Intravascular Pressure Wire/FFR Study;  Surgeon: Corky Crafts, MD;  Location: [facility] INVASIVE CV LAB;  Service: Cardiovascular;  Laterality: N/A;   Cardiolite  08/16/2008   [facility]    Current Medications: Current Meds  Medication Sig   amoxicillin-clavulanate (AUGMENTIN) 875-125 MG tablet Take 1 tablet by mouth 2 (two) times daily.   aspirin 81 MG chewable tablet Chew 1 tablet (81 mg total) by mouth daily.   atorvastatin (LIPITOR) 20 MG tablet Take 1 tablet (20 mg total) by mouth daily. Please keep scheduled appointment for future refills. Thank you.   carvedilol (COREG) 6.25 MG  tablet Take 1 tablet (6.25 mg total) by mouth 2 (two) times daily. Please keep scheduled appointment for future refills. Thank you.   clopidogrel (PLAVIX) 75 MG tablet Take 1 tablet (75 mg total) by mouth daily. Please keep scheduled appointment for future refills. Thank you.   colchicine 0.6 MG tablet Take 0.5 tablets (0.3 mg total) by mouth daily as needed (for gout).   doxycycline (VIBRA-TABS) 100 MG tablet Take 1 tablet (100 mg total) by mouth daily.   HYDROcodone-acetaminophen (NORCO/VICODIN) 5-325 MG tablet Take 1 tablet by mouth every 6 (six) hours as needed for severe pain (for gout).   nitroGLYCERIN (NITROSTAT) 0.4 MG SL tablet Place 1 tablet (0.4 mg total) under the tongue every 5 (five) minutes as  needed for chest pain.   omega-3 acid ethyl esters (LOVAZA) 1 g capsule TAKE 2 CAPSULES BY MOUTH EVERY MORNING AND TAKE TWO CAPSULES BY MOUTH EVERY EVENING   Omega-3 Fatty Acids (FISH OIL) 1000 MG CAPS Take 2 capsules (2,000 mg total) by mouth in the morning and at bedtime.   predniSONE (DELTASONE) 10 MG tablet Take 2 a day for 5 days, then 1 a day for 5 days, with food. Don't take with aleve/ibuprofen.  Use only if colchicine doesn't help with gout.   sacubitril-valsartan (ENTRESTO) 97-103 MG Take 1 tablet by mouth 2 (two) times daily.   spironolactone (ALDACTONE) 25 MG tablet TAKE 1/2 TABLET BY MOUTH DAILY. Please keep scheduled appointment for future refills. Thank you.   Tetrahydrozoline HCl (VISINE OP) Place 1 drop into both eyes as needed (for dry eyes).   Vitamin D, Cholecalciferol, 25 MCG (1000 UT) CAPS Take 1,000 Units by mouth daily.     Allergies:   Iodine and Latex   Social History   Socioeconomic History   Marital status: Widowed    Spouse name: Not on file   Number of children: 0   Years of education: Not on file   Highest education level: Not on file  Occupational History   Occupation: Property Patents/Inventions/Patents  Tobacco Use   Smoking status: Former    Packs/day: 2.00    Years: 45.00    Additional pack years: 0.00    Total pack years: 90.00    Types: Cigarettes    Quit date: 10/28/2015    Years since quitting: 7.0   Smokeless tobacco: Never  Vaping Use   Vaping Use: Never used  Substance and Sexual Activity   Alcohol use: Yes    Alcohol/week: 2.0 standard drinks of alcohol    Types: 1 Glasses of wine, 1 Shots of liquor per week   Drug use: No   Sexual activity: Not Currently  Other Topics Concern   Not on file  Social History Narrative   [employer] '78-'79   Prev investment banking, now trades crude Mudlogger   Widowed, wife died of mesothelioma   No kids   Social Determinants of Corporate investment banker Strain: Not on file  Food  Insecurity: Not on file  Transportation Needs: Not on file  Physical Activity: Not on file  Stress: Not on file  Social Connections: Not on file     Family History: The patient's family history includes CAD in his father and another family member; Heart disease in his father and another family member; Lung cancer in an other family member. There is no history of Colon cancer or Prostate cancer.  ROS:   Please see the history of present illness.    ROS  All  other systems reviewed and negative.   EKGs/Labs/Other Studies Reviewed:    The following studies were reviewed today: none  EKG:  EKG is  ordered today.  The ekg ordered today demonstrates NSR with no ST changes  Recent Labs: No results found for requested labs within last 365 days.   Recent Lipid Panel    Component Value Date/Time   CHOL 129 10/12/2021 0719   TRIG 150 (H) 10/12/2021 0719   HDL 34 (L) 10/12/2021 0719   CHOLHDL 3.8 10/12/2021 0719   CHOLHDL 5 08/27/2021 0802   VLDL 57.8 (H) 01/15/2021 1047   LDLCALC 69 10/12/2021 0719   LDLDIRECT 59.0 08/27/2021 0802    Physical Exam:    VS:  BP 100/60   Pulse 70   Ht 6' (1.829 m)   Wt (!) 313 lb 9.6 oz (142.2 kg)   SpO2 98%   BMI 42.53 kg/m     Wt Readings from Last 3 Encounters:  11/01/22 (!) 313 lb 9.6 oz (142.2 kg)  10/09/21 289 lb (131.1 kg)  09/18/21 288 lb (130.6 kg)     GEN: Well nourished, well developed in no acute distress HEENT: Normal NECK: No JVD; No carotid bruits LYMPHATICS: No lymphadenopathy CARDIAC:RRR, no murmurs, rubs, gallops RESPIRATORY:  Clear to auscultation without rales, wheezing or rhonchi  ABDOMEN: Soft, non-tender, non-distended MUSCULOSKELETAL:  No edema; No deformity  SKIN: Warm and dry NEUROLOGIC:  Alert and oriented x 3 PSYCHIATRIC:  Normal affect   ASSESSMENT:    1. DCM (dilated cardiomyopathy) (HCC)   2. Coronary artery disease involving native coronary artery of native heart without angina pectoris   3.  Primary hypertension   4. Hyperlipidemia with target LDL less than 70     PLAN:    In order of problems listed above:  1.  DCM  -This is likely mixed ischemic/nonischemic.   -EF 35-40% by echo 2017 and 42% by cardiac MRI 05/2016.   -EF normalized at 60-65% by echo 02/2020 -He appears euvolemic on exam today -Continue prescription drug management with carvedilol 6.25 mg twice daily, Entresto 97-103 mg twice daily and spironolactone 12.5 g daily with as needed refills -Check CMET today   2.  ASCAD  - cath 02/06/16 showing 80% mid CFX lesion, treated with a DES, and a 60% LAD lesion, not significant by FFR.  -Denies any anginal symptoms but has been having DOE likely related to weight but has not had a stress test or echo in over 4 years -check 2D echo -Shared Decision Making/Informed Consent The risks [chest pain, shortness of breath, cardiac arrhythmias, dizziness, blood pressure fluctuations, myocardial infarction, stroke/transient ischemic attack, nausea, vomiting, allergic reaction, radiation exposure, metallic taste sensation and life-threatening complications (estimated to be 1 in 10,000)], benefits (risk stratification, diagnosing coronary artery disease, treatment guidance) and alternatives of a cardiac PET stress test were discussed in detail with Mr. Stupar and he agrees to proceed. -Continue prescription drug management with aspirin 81 mg daily, carvedilol 6.25 mg twice daily, Plavix 75 mg daily, atorvastatin 20 mg daily with as needed refills  3 .  HTN  -BP is adequate controlled on exam today -Continue prescription drug management with carvedilol 6.25 mg twice daily, Entresto 97-23 mg daily twice daily and spironolactone 12.5 mg daily with as needed refills  4.  Hyperlipidemia -LDL goal < 70.  -Check fasting lipid panel today -Continue prescription drug management with atorvastatin 20 mg daily with as needed refills    Medication Adjustments/Labs and Tests  Ordered:  Current medicines are reviewed at length with the patient today.  Concerns regarding medicines are outlined above.  Orders Placed This Encounter  Procedures   EKG 12-Lead   No orders of the defined types were placed in this encounter.   Signed, Armanda Magic, MD  11/01/2022 8:24 AM    [facility]

## 2022-11-01 NOTE — Addendum Note (Signed)
Addended by: Quintella Reichert on: 11/01/2022 09:21 AM   Modules accepted: Orders

## 2022-11-01 NOTE — Addendum Note (Signed)
Addended by: Luellen Pucker on: 11/01/2022 08:42 AM   Modules accepted: Orders

## 2022-11-01 NOTE — Patient Instructions (Signed)
Medication Instructions:  Your physician recommends that you continue on your current medications as directed. Please refer to the Current Medication list given to you today.  *If you need a refill on your cardiac medications before your next appointment, please call your pharmacy*   Lab Work: Please complete a FASTING lipid panel (FLP) and a CMET in our lab today.  If you have labs (blood work) drawn today and your tests are completely normal, you will receive your results only by: MyChart Message (if you have MyChart) OR A paper copy in the mail If you have any lab test that is abnormal or we need to change your treatment, we will call you to review the results.   Testing/Procedures: Your physician has requested that you have an echocardiogram. Echocardiography is a painless test that uses sound waves to create images of your heart. It provides your doctor with information about the size and shape of your heart and how well your heart's chambers and valves are working. This procedure takes approximately one hour. There are no restrictions for this procedure. Please do NOT wear cologne, perfume, aftershave, or lotions (deodorant is allowed). Please arrive 15 minutes prior to your appointment time.    How to Prepare for Your Cardiac PET/CT Stress Test:  1. Please do not take these medications before your test:   Medications that may interfere with the cardiac pharmacological stress agent (ex. nitrates - including erectile dysfunction medications, isosorbide mononitrate, tamulosin or beta-blockers) the day of the exam. (Erectile dysfunction medication should be held for at least 72 hrs prior to test) Your remaining medications may be taken with water.  2. Nothing to eat or drink, except water, 3 hours prior to arrival time.   NO caffeine/decaffeinated products, or chocolate 12 hours prior to arrival.  3. NO perfume, cologne or lotion  4. Total time is 1 to 2 hours; you may want to  bring reading material for the waiting time.  5. Please report to Radiology at the [facility] Main Entrance 30 minutes early for your test.  [address] [place], [place] [address]   IF YOU THINK YOU MAY BE PREGNANT, OR ARE NURSING PLEASE INFORM THE TECHNOLOGIST.  In preparation for your appointment, medication and supplies will be purchased.  Appointment availability is limited, so if you need to cancel or reschedule, please call the Radiology Department at [phone]  24 hours in advance to avoid a cancellation fee of $100.00  What to Expect After you Arrive:  Once you arrive and check in for your appointment, you will be taken to a preparation room within the Radiology Department.  A technologist or Nurse will obtain your medical history, verify that you are correctly prepped for the exam, and explain the procedure.  Afterwards,  an IV will be started in your arm and electrodes will be placed on your skin for EKG monitoring during the stress portion of the exam. Then you will be escorted to the PET/CT scanner.  There, staff will get you positioned on the scanner and obtain a blood pressure and EKG.  During the exam, you will continue to be connected to the EKG and blood pressure machines.  A small, safe amount of a radioactive tracer will be injected in your IV to obtain a series of pictures of your heart along with an injection of a stress agent.    After your Exam:  It is recommended that you eat a meal and drink a caffeinated beverage to  counter act any effects of the stress agent.  Drink plenty of fluids for the remainder of the day and urinate frequently for the first couple of hours after the exam.  Your doctor will inform you of your test results within 7-10 business days.  For questions about your test or how to prepare for your test, please call: Rockwell Alexandria, Cardiac Imaging Nurse Navigator  Larey Brick, Cardiac Imaging Nurse Navigator Office:  [phone]     Follow-Up: At [facility], you and your health needs are our priority.  As part of our continuing mission to provide you with exceptional heart care, we have created designated Provider Care Teams.  These Care Teams include your primary Cardiologist (physician) and Advanced Practice Providers (APPs -  Physician Assistants and Nurse Practitioners) who all work together to provide you with the care you need, when you need it.  We recommend signing up for the patient portal called "MyChart".  Sign up information is provided on this After Visit Summary.  MyChart is used to connect with patients for Virtual Visits (Telemedicine).  Patients are able to view lab/test results, encounter notes, upcoming appointments, etc.  Non-urgent messages can be sent to your provider as well.   To learn more about what you can do with MyChart, go to [email or web address].    Your next appointment:   1 year(s)  Provider:   Armanda Magic, MD

## 2022-11-01 NOTE — Addendum Note (Signed)
Addended by: Luellen Pucker on: 11/01/2022 08:44 AM   Modules accepted: Orders

## 2022-11-02 ENCOUNTER — Other Ambulatory Visit: Payer: Self-pay | Admitting: Cardiology

## 2022-11-02 LAB — COMPREHENSIVE METABOLIC PANEL
ALT: 31 IU/L (ref 0–44)
AST: 26 IU/L (ref 0–40)
Albumin/Globulin Ratio: 2.3 — ABNORMAL HIGH (ref 1.2–2.2)
Albumin: 4.5 g/dL (ref 3.9–4.9)
Alkaline Phosphatase: 80 IU/L (ref 44–121)
BUN/Creatinine Ratio: 13 (ref 10–24)
BUN: 15 mg/dL (ref 8–27)
Bilirubin Total: 0.3 mg/dL (ref 0.0–1.2)
CO2: 24 mmol/L (ref 20–29)
Calcium: 9 mg/dL (ref 8.6–10.2)
Chloride: 102 mmol/L (ref 96–106)
Creatinine, Ser: 1.19 mg/dL (ref 0.76–1.27)
Globulin, Total: 2 g/dL (ref 1.5–4.5)
Glucose: 129 mg/dL — ABNORMAL HIGH (ref 70–99)
Potassium: 4.5 mmol/L (ref 3.5–5.2)
Sodium: 137 mmol/L (ref 134–144)
Total Protein: 6.5 g/dL (ref 6.0–8.5)
eGFR: 67 mL/min/{1.73_m2} (ref >59)

## 2022-11-02 LAB — LIPID PANEL
Chol/HDL Ratio: 3.9 ratio (ref 0.0–5.0)
Cholesterol, Total: 120 mg/dL (ref 100–199)
HDL: 31 mg/dL — ABNORMAL LOW (ref >39)
LDL Chol Calc (NIH): 55 mg/dL (ref 0–99)
Triglycerides: 211 mg/dL — ABNORMAL HIGH (ref 0–149)
VLDL Cholesterol Cal: 34 mg/dL (ref 5–40)

## 2022-11-04 ENCOUNTER — Other Ambulatory Visit: Payer: Self-pay

## 2022-11-04 DIAGNOSIS — E785 Hyperlipidemia, unspecified: Secondary | ICD-10-CM

## 2022-11-04 DIAGNOSIS — Z79899 Other long term (current) drug therapy: Secondary | ICD-10-CM

## 2022-11-22 ENCOUNTER — Ambulatory Visit: Payer: Self-pay | Attending: Cardiology

## 2022-11-22 DIAGNOSIS — E785 Hyperlipidemia, unspecified: Secondary | ICD-10-CM

## 2022-11-22 DIAGNOSIS — Z79899 Other long term (current) drug therapy: Secondary | ICD-10-CM

## 2022-11-23 LAB — LIPID PANEL
Chol/HDL Ratio: 4.1 ratio (ref 0.0–5.0)
Cholesterol, Total: 126 mg/dL (ref 100–199)
HDL: 31 mg/dL — ABNORMAL LOW (ref >39)
LDL Chol Calc (NIH): 68 mg/dL (ref 0–99)
Triglycerides: 154 mg/dL — ABNORMAL HIGH (ref 0–149)
VLDL Cholesterol Cal: 27 mg/dL (ref 5–40)

## 2022-12-04 ENCOUNTER — Other Ambulatory Visit (HOSPITAL_COMMUNITY): Payer: Self-pay

## 2022-12-05 ENCOUNTER — Telehealth: Payer: Self-pay | Admitting: Family Medicine

## 2022-12-05 NOTE — Telephone Encounter (Signed)
Patient was last seen 09/18/21 and has upcoming appt on 12/12/22; okay to refill meds?

## 2022-12-05 NOTE — Telephone Encounter (Signed)
Prescription Request  12/05/2022  LOV: Visit date not found  What is the name of the medication or equipment? colchicine 0.6 MG tablet   predniSONE (DELTASONE) 10 MG tablet   Have you contacted your pharmacy to request a refill? No   Which pharmacy would you like this sent to?  [id number] [address] [address], [place] - [address] [place] [address] Phone: [phone] Fax: [phone]    Patient notified that their request is being sent to the clinical staff for review and that they should receive a response within 2 business days.   Please advise at [facility] [phone]

## 2022-12-06 MED ORDER — PREDNISONE 10 MG PO TABS: ORAL_TABLET | ORAL | 0 refills | Status: DC

## 2022-12-06 MED ORDER — COLCHICINE 0.6 MG PO TABS: 0.3000 mg | ORAL_TABLET | Freq: Every day | ORAL | 0 refills | Status: DC | PRN

## 2022-12-06 NOTE — Telephone Encounter (Signed)
He was due for f/u last October.   I sent the rxs in the meantime.

## 2022-12-06 NOTE — Addendum Note (Signed)
Addended by: Joaquim Nam on: 12/06/2022 09:35 AM   Modules accepted: Orders

## 2022-12-12 ENCOUNTER — Ambulatory Visit (INDEPENDENT_AMBULATORY_CARE_PROVIDER_SITE_OTHER): Payer: Self-pay | Admitting: Family Medicine

## 2022-12-12 ENCOUNTER — Encounter: Payer: Self-pay | Admitting: Family Medicine

## 2022-12-12 VITALS — BP 120/82 | HR 92 | Temp 97.7°F | Ht 72.0 in | Wt 311.0 lb

## 2022-12-12 DIAGNOSIS — E119 Type 2 diabetes mellitus without complications: Secondary | ICD-10-CM

## 2022-12-12 DIAGNOSIS — M109 Gout, unspecified: Secondary | ICD-10-CM

## 2022-12-12 DIAGNOSIS — Z7184 Encounter for health counseling related to travel: Secondary | ICD-10-CM

## 2022-12-12 LAB — MICROALBUMIN / CREATININE URINE RATIO
Creatinine,U: 105.7 mg/dL
Microalb Creat Ratio: 0.7 mg/g (ref 0.0–30.0)
Microalb, Ur: <0.7 mg/dL (ref 0.0–1.9)

## 2022-12-12 LAB — URIC ACID: Uric Acid, Serum: 7.5 mg/dL (ref 4.0–7.8)

## 2022-12-12 LAB — HEMOGLOBIN A1C: Hgb A1c MFr Bld: 7.5 % — ABNORMAL HIGH (ref 4.6–6.5)

## 2022-12-12 LAB — TSH: TSH: 1.14 u[IU]/mL (ref 0.35–5.50)

## 2022-12-12 MED ORDER — HYDROCODONE-ACETAMINOPHEN 5-325 MG PO TABS: 1.0000 | ORAL_TABLET | Freq: Four times a day (QID) | ORAL | 0 refills | Status: DC | PRN

## 2022-12-12 MED ORDER — DOXYCYCLINE HYCLATE 100 MG PO TABS: ORAL_TABLET | ORAL | 1 refills | Status: DC

## 2022-12-12 NOTE — Patient Instructions (Signed)
Go to the lab on the way out.   If you have mychart we'll likely use that to update you.    Take care.  Glad to see you. 

## 2022-12-12 NOTE — Progress Notes (Unsigned)
Diabetes:  No meds.  Hypoglycemic episodes: not checked.  Hyperglycemic episodes: not checked Feet problems:  no numbness or tingling.  Blood Sugars averaging: not checked  eye exam within last year: yes Labs pending.  Breakfast at home, then out for lunch and dinner.  Discussed diet and exercise.  2-3 gout attacks per year.  R foot pain with current flare, 2/10 pain.  Sx overall are less severe than prior years.  1/2 dose colchicine d/w pt, given his concurrent medications.  Discussed using colchicine then prednisone then hydrocodone if needed.    D/w pt about malaria prophylaxis in [place].  Had taken doxy in the past.  Current recommendations reviewed, routine use discussed with patient.  PMH and SH reviewed  Meds, vitals, and allergies reviewed.   ROS: Per HPI unless specifically indicated in ROS section   GEN: nad, alert and oriented HEENT: ncat NECK: supple w/o LA CV: rrr. PULM: ctab, no inc wob ABD: soft, +bs EXT: no edema SKIN: no acute rash  Diabetic foot exam: Normal inspection No skin breakdown No calluses  Normal DP pulses Normal sensation to light touch and monofilament Nails normal  25 minutes were devoted to patient care in this encounter (this includes time spent reviewing the patient's file/history, interviewing and examining the patient, counseling/reviewing plan with patient).

## 2022-12-15 ENCOUNTER — Other Ambulatory Visit: Payer: Self-pay | Admitting: Family Medicine

## 2022-12-15 DIAGNOSIS — Z7184 Encounter for health counseling related to travel: Secondary | ICD-10-CM | POA: Insufficient documentation

## 2022-12-15 MED ORDER — OZEMPIC (0.25 OR 0.5 MG/DOSE) 2 MG/3ML ~~LOC~~ SOPN: PEN_INJECTOR | SUBCUTANEOUS | Status: DC

## 2022-12-15 NOTE — Assessment & Plan Note (Signed)
Labs pending.  Breakfast at home, then out for lunch and dinner.  Discussed diet and exercise. Discussed weight loss options.

## 2022-12-15 NOTE — Assessment & Plan Note (Signed)
1/2 dose colchicine d/w pt, given his concurrent medications.  Discussed using colchicine then prednisone then hydrocodone if needed.

## 2022-12-15 NOTE — Assessment & Plan Note (Signed)
D/w pt about malaria prophylaxis in [place].  Had taken doxy in the past.  Current recommendations reviewed, routine use discussed with patient.

## 2022-12-18 ENCOUNTER — Ambulatory Visit (HOSPITAL_COMMUNITY): Payer: Self-pay

## 2023-01-02 ENCOUNTER — Telehealth: Payer: Self-pay | Admitting: Family Medicine

## 2023-01-02 MED ORDER — DOXYCYCLINE HYCLATE 100 MG PO TABS: ORAL_TABLET | ORAL | 1 refills | Status: DC

## 2023-01-02 MED ORDER — PREDNISONE 10 MG PO TABS: ORAL_TABLET | ORAL | 0 refills | Status: DC

## 2023-01-02 MED ORDER — HYDROCODONE-ACETAMINOPHEN 5-325 MG PO TABS: 1.0000 | ORAL_TABLET | Freq: Four times a day (QID) | ORAL | 0 refills | Status: DC | PRN

## 2023-01-02 NOTE — Telephone Encounter (Signed)
LOV - 12/12/22 NOV - 03/21/23 RF - doxycycline 12/12/22 #40/1         Prednisone 12/06/22 #15/0         Hydrocodone 12/12/22 #10/0

## 2023-01-02 NOTE — Addendum Note (Signed)
Addended by: Joaquim Nam on: 01/02/2023 01:51 PM   Modules accepted: Orders

## 2023-01-02 NOTE — Telephone Encounter (Signed)
Prescription Request  01/02/2023  LOV: 12/12/2022  What is the name of the medication or equipment? predniSONE (DELTASONE) 10 MG tablet, HYDROcodone-acetaminophen (NORCO/VICODIN) 5-325 MG tablet & doxycycline (VIBRA-TABS) 100 MG tablet   Have you contacted your pharmacy to request a refill? No   Which pharmacy would you like this sent to?  [facility] [id number] [address], [place] - [address] [place] [address] Phone: [phone] Fax: [phone]    Patient notified that their request is being sent to the clinical staff for review and that they should receive a response within 2 business days.   Please advise at Mobile [phone] (mobile)  Pt called stating he had his meds on his desk & believed his cleaning lady might've thrown his meds away.

## 2023-01-02 NOTE — Telephone Encounter (Signed)
Sent. Thanks.   

## 2023-01-09 ENCOUNTER — Ambulatory Visit (HOSPITAL_COMMUNITY): Payer: Self-pay

## 2023-01-28 ENCOUNTER — Telehealth: Payer: Self-pay | Admitting: Cardiology

## 2023-01-28 NOTE — Telephone Encounter (Signed)
Pt called in asking to speak to Dr. Norris Cross nurse about some tests he  supposed to be having. He stated he has an echo and a PET scan scheduled but he doesn't think he is supposed to have both. Please advise.

## 2023-01-28 NOTE — Telephone Encounter (Signed)
Patient is asking if he needs to have both an echo and a PET stress test. Informed patient what each test is for and why Dr. Mayford Knife ordered them. He does need to have both, patient verbalized understanding.  Patient also asked if he should have his appointment with Dr. Mayford Knife rescheduled from 9/24 to after his PET on 10/3.  Will forward to Dr. Norris Cross nurse to address when she returns to the office.

## 2023-01-31 ENCOUNTER — Ambulatory Visit (HOSPITAL_COMMUNITY): Payer: Self-pay | Attending: Cardiology

## 2023-01-31 DIAGNOSIS — R0600 Dyspnea, unspecified: Secondary | ICD-10-CM | POA: Insufficient documentation

## 2023-01-31 DIAGNOSIS — I2510 Atherosclerotic heart disease of native coronary artery without angina pectoris: Secondary | ICD-10-CM | POA: Insufficient documentation

## 2023-01-31 LAB — ECHOCARDIOGRAM COMPLETE
Area-P 1/2: 2.54 cm2
S' Lateral: 3 cm

## 2023-01-31 NOTE — Telephone Encounter (Signed)
Patient's 1 yr f/u appt changed from 02/18/23 to 04/08/23 so he has time to complete testing.

## 2023-02-04 ENCOUNTER — Telehealth: Payer: Self-pay | Admitting: *Deleted

## 2023-02-04 NOTE — Telephone Encounter (Signed)
Spoke with patient. He DID NOT have a CVA in 10/2022. He had a CVA in 10/2015.  He is due for a PET scan on February 27, 2023 and plans to have the dental extractions after the scan if everything is stable. Do they still need the dental clearance?  He remains on Plavix as directed.   Samara Deist

## 2023-02-04 NOTE — Telephone Encounter (Signed)
Dr. Retta Mac, DDS office called back to confirm needed information.   Update on the procedure:  8 UPPER TEETH EXTRACTED (2 SURGICAL AND 6 SIMPLE EXTRACTIONS) BONE REDUCTION 6 DENTAL IMPLANTS TO PREPARE FOR DENTURES  DID CONFIRM NOTES ON THE ORIGINAL FORM; THESE NOTES HAVE BEEN ADDED TO PRE OP FORM FOR PRE OP APP TO REVIEW; DR. Aline August, DDS IS WANTING TO CONFIRM IF PT OK TO HAVE PROCEDURE SINCE RECENT STROKE 10/2022 OR IF THEY SHOULD DELAY THE PROCEDURE.

## 2023-02-04 NOTE — Telephone Encounter (Signed)
   Pre-operative Risk Assessment    Patient Name: Colton [name].  DOB: 05-31-1955 MRN: [id number]      DATE OF LAST VISIT: 11/01/22 DR. TURNER  DATE OF NEXT VISIT: 04/08/23 DR. TURNER  Request for Surgical Clearance    Procedure:   MAXILLARY ALL-ON-X: I CLARIFIED PROCEDURE WITH Colton [name] AT DDS OFFICE. CLARIFIED PROCEDURE IS DENTAL IMPLANTS TO PREPARE FOR DENTURES. I ASKED HOW MANY IMPLANTS; Colton [name] WILL HAVE SURGERY SCHEDULER CALL BACK TO CONFIRM HOW MANY IMPLANTS   Date of Surgery:  Clearance TBD                                 Surgeon:  DR. Cherly Anderson, DDS Surgeon's Group or Practice Name:  [facility] Phone number:  [phone] Fax number:  [phone]   Type of Clearance Requested:   - Medical ; PER CLEARANCE PLAN IS TO AVOID EPI AND ANTICOAGULATION CAN BE CONTINUED OR SHOULD THEY DELAY PROCEDURE   Type of Anesthesia:   MODERATE SEDATION WITH FENTANYL AND VERSED   Additional requests/questions:    Colton [name]   02/04/2023, 10:15 AM

## 2023-02-05 ENCOUNTER — Telehealth: Payer: Self-pay

## 2023-02-05 NOTE — Telephone Encounter (Signed)
-----   Message from Armanda Magic sent at 02/03/2023  8:51 AM EDT ----- 2D echo showed normal heart function EF 55 to 60% with increased stiffness of the heart called diastolic dysfunction.  Mildly calcified aortic valve with no AS.  Echo essentially normal

## 2023-02-05 NOTE — Telephone Encounter (Signed)
Patient verbalizes understanding that 2D echo showed normal heart function EF 55 to 60% with increased stiffness of the heart called diastolic dysfunction.  Mildly calcified aortic valve with no AS.  Echo essentially normal per Dr. Mayford Knife.

## 2023-02-10 NOTE — Telephone Encounter (Signed)
I spoke with dental office and let them know that pt will be cleared after PET scan 02/27/23.

## 2023-02-11 NOTE — Telephone Encounter (Signed)
Spoke with patient with patient states that he going to do his dental procedure after the PET scan.

## 2023-02-18 ENCOUNTER — Ambulatory Visit: Payer: Self-pay | Admitting: Cardiology

## 2023-02-25 ENCOUNTER — Encounter (HOSPITAL_COMMUNITY): Payer: Self-pay

## 2023-02-27 ENCOUNTER — Ambulatory Visit
Admission: RE | Admit: 2023-02-27 | Discharge: 2023-02-27 | Disposition: A | Discharge status: Home / Self Care | Payer: Self-pay | Source: Ambulatory Visit | Attending: Cardiology | Admitting: Cardiology

## 2023-02-27 DIAGNOSIS — K760 Fatty (change of) liver, not elsewhere classified: Secondary | ICD-10-CM | POA: Insufficient documentation

## 2023-02-27 DIAGNOSIS — I700 Atherosclerosis of aorta: Secondary | ICD-10-CM | POA: Insufficient documentation

## 2023-02-27 DIAGNOSIS — R0600 Dyspnea, unspecified: Secondary | ICD-10-CM

## 2023-02-27 DIAGNOSIS — R911 Solitary pulmonary nodule: Secondary | ICD-10-CM | POA: Insufficient documentation

## 2023-02-27 DIAGNOSIS — J439 Emphysema, unspecified: Secondary | ICD-10-CM | POA: Insufficient documentation

## 2023-02-27 LAB — NM PET CT CARDIAC PERFUSION MULTI W/ABSOLUTE BLOODFLOW
LV dias vol: 125 mL (ref 62–150)
LV sys vol: 54 mL
MBFR: 1.95
Nuc Rest EF: 49 %
Nuc Stress EF: 57 %
Peak HR: 93 {beats}/min
Rest HR: 68 {beats}/min
Rest MBF: 0.75 ml/g/min
Rest Nuclear Isotope Dose: 25.1 mCi
SRS: 0
SSS: 1
ST Depression (mm): 0 mm
Stress MBF: 1.46 ml/g/min
Stress Nuclear Isotope Dose: 25.1 mCi
TID: 1.1

## 2023-02-27 MED ORDER — RUBIDIUM RB82 GENERATOR (RUBYFILL)
25.0000 | PACK | Freq: Once | INTRAVENOUS | Status: AC
  Administered 2023-02-27: 25.07 via INTRAVENOUS

## 2023-02-27 MED ORDER — REGADENOSON 0.4 MG/5ML IV SOLN
INTRAVENOUS | Status: AC
  Filled 2023-02-27: qty 5

## 2023-02-27 MED ORDER — REGADENOSON 0.4 MG/5ML IV SOLN
0.4000 mg | Freq: Once | INTRAVENOUS | Status: AC
  Administered 2023-02-27: 0.4 mg via INTRAVENOUS

## 2023-02-27 MED ORDER — SODIUM CHLORIDE 0.9 % IV BOLUS
500.0000 mL | Freq: Once | INTRAVENOUS | Status: AC
  Administered 2023-02-27: 500 mL via INTRAVENOUS

## 2023-02-27 NOTE — Progress Notes (Signed)
Patient presents for a cardiac PET stress test and tolerated procedure without incident. Patient maintained acceptable vital signs throughout the test and was offered caffeine after test.  Patient ambulated out of department with a steady gait.  

## 2023-02-28 ENCOUNTER — Other Ambulatory Visit: Payer: Self-pay | Admitting: Family Medicine

## 2023-02-28 DIAGNOSIS — Z1212 Encounter for screening for malignant neoplasm of rectum: Secondary | ICD-10-CM

## 2023-02-28 DIAGNOSIS — Z1211 Encounter for screening for malignant neoplasm of colon: Secondary | ICD-10-CM

## 2023-03-04 ENCOUNTER — Telehealth: Payer: Self-pay

## 2023-03-04 NOTE — Telephone Encounter (Signed)
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  Dr. Malachy Mood states that she suspects that his shortness of breath is related to weight gain given his weight of 311 pounds and not from coronary ischemia. Patient agrees to continue to work on diet and exercise to try to lose weight with [facility] healthy weight [facility] wellness program and to Continue carvedilol microvascular disease . Pt w/ appt to see Dr. Mayford Knife 04/08/23.

## 2023-03-04 NOTE — Telephone Encounter (Signed)
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  Suspect that his shortness of breath is related to weight gain given his weight of 311 pounds and not from coronary ischemia.  Needs to work on diet and exercise to try to lose weight.  He is referred to [facility] healthy weight and wellness program.  Continue carvedilol microvascular disease

## 2023-03-21 ENCOUNTER — Encounter: Payer: Self-pay | Admitting: Family Medicine

## 2023-03-21 ENCOUNTER — Ambulatory Visit (INDEPENDENT_AMBULATORY_CARE_PROVIDER_SITE_OTHER): Payer: Self-pay | Admitting: Family Medicine

## 2023-03-21 VITALS — BP 116/72 | HR 61 | Temp 97.9°F | Ht 72.0 in | Wt 317.4 lb

## 2023-03-21 DIAGNOSIS — M109 Gout, unspecified: Secondary | ICD-10-CM

## 2023-03-21 DIAGNOSIS — R911 Solitary pulmonary nodule: Secondary | ICD-10-CM

## 2023-03-21 DIAGNOSIS — E119 Type 2 diabetes mellitus without complications: Secondary | ICD-10-CM

## 2023-03-21 DIAGNOSIS — M79673 Pain in unspecified foot: Secondary | ICD-10-CM

## 2023-03-21 LAB — POCT GLYCOSYLATED HEMOGLOBIN (HGB A1C): Hemoglobin A1C: 7.7 % — AB (ref 4.0–5.6)

## 2023-03-21 NOTE — Progress Notes (Unsigned)
Diabetes:  No meds.  D/w pt about diet and exercise.  Sleep/work stressors d/w pt.  Bed time 10 PM, up 4-5 AM.  Had been walking for exercise, 20 - 30 min per day.   Hypoglycemic episodes: no sx  Hyperglycemic episodes: no sx Feet problems: see below.   Blood Sugars averaging: not checked.   eye exam within last year: yes A1c 7.7.   He wanted to get through some deadlines at work and then reassess in 05/2023.    R foot pain, for about 1 week.  No bruising.  No trauma.  No pop or snap.  Slowly getting better, but already getting better.    He travelled to [place].    He had used colchicine for gout if needed, then prednisone/hydrocodone if needed.  Rare flares.    IMPRESSION: 1. New small solid pulmonary nodule of the left upper lobe measuring 6 mm. Non-contrast chest CT at 6-12 months is recommended. If the nodule is stable at time of repeat CT, then future CT at 18-24 months (from today's scan) is considered optional for low-risk patients, but is recommended for high-risk patients.  2. Hepatic steatosis. 3. Aortic Atherosclerosis (ICD10-I70.0) and Emphysema (ICD10-J43.9).  Meds, vitals, and allergies reviewed.   ROS: Per HPI unless specifically indicated in ROS section   GEN: nad, alert and oriented HEENT: ncat NECK: supple w/o LA CV: rrr. PULM: ctab, no inc wob EXT: no edema SKIN: no acute rash  R foot with normal inspection, w/o bruising.  Proximal 5th TTP Med and lat mal not ttp Normal DP pulse.

## 2023-03-21 NOTE — Patient Instructions (Addendum)
Supportive shoes and ice for 5 minutes at a time as needed.   If getting better, then continue as is.   If more pain, then we can xray your foot.  Please call back as needed.   Plan on recheck in 05/2023 and we'll go from there.

## 2023-03-23 DIAGNOSIS — R911 Solitary pulmonary nodule: Secondary | ICD-10-CM | POA: Insufficient documentation

## 2023-03-23 DIAGNOSIS — M79673 Pain in unspecified foot: Secondary | ICD-10-CM | POA: Insufficient documentation

## 2023-03-23 NOTE — Assessment & Plan Note (Signed)
Discussed plan with CT, wouldn't be appropriate for biopsy or more invasive testing at this point. New small solid pulmonary nodule of the left upper lobe measuring 6 mm. Non-contrast chest CT at 6-12 months is recommended. If the nodule is stable at time of repeat CT, then future CT at 18-24 months (from today's scan) is considered optional for low-risk patients, but is recommended for high-risk patients.

## 2023-03-23 NOTE — Assessment & Plan Note (Signed)
No trauma.  Already getting better, rec: supportive shoes and ice for 5 minutes at a time as needed.   If getting better, then continue as is.   If more pain, then we can xray his foot.  He can call back as needed.

## 2023-03-23 NOTE — Assessment & Plan Note (Signed)
He had used colchicine for gout if needed, then prednisone/hydrocodone if needed.  Rare flares.   Continue as is.

## 2023-03-23 NOTE — Assessment & Plan Note (Signed)
A1c 7.7.   He wanted to get through some deadlines at work and then reassess in 05/2023.   Discussed diet, exercise, sleep.

## 2023-03-31 ENCOUNTER — Other Ambulatory Visit: Payer: Self-pay | Admitting: Family Medicine

## 2023-04-02 MED ORDER — PREDNISONE 10 MG PO TABS: ORAL_TABLET | ORAL | 0 refills | Status: DC

## 2023-04-05 ENCOUNTER — Other Ambulatory Visit: Payer: Self-pay | Admitting: Cardiology

## 2023-04-08 ENCOUNTER — Ambulatory Visit: Payer: Self-pay | Admitting: Cardiology

## 2023-04-18 NOTE — Telephone Encounter (Signed)
Patient is requesting call back to get update on clearance. Please advise.

## 2023-04-19 ENCOUNTER — Other Ambulatory Visit: Payer: Self-pay | Admitting: Cardiology

## 2023-04-21 NOTE — Telephone Encounter (Signed)
I routed this to Dr. Mayford Knife who is managing his lipids.  Thanks.

## 2023-04-22 ENCOUNTER — Telehealth: Payer: Self-pay | Admitting: Cardiology

## 2023-04-22 ENCOUNTER — Other Ambulatory Visit: Payer: Self-pay | Admitting: Cardiology

## 2023-04-22 ENCOUNTER — Telehealth: Payer: Self-pay

## 2023-04-22 MED ORDER — OMEGA-3 FATTY ACIDS 1000 MG PO CAPS: 2.0000 g | ORAL_CAPSULE | Freq: Every day | ORAL | 3 refills | Status: DC

## 2023-04-22 NOTE — Addendum Note (Signed)
Addended by: Luellen Pucker on: 04/22/2023 03:23 PM   Modules accepted: Orders

## 2023-04-22 NOTE — Telephone Encounter (Signed)
[name] to answer your question yes the dental office needs to send a new clearance as the original is over 2 months  old.

## 2023-04-22 NOTE — Telephone Encounter (Signed)
Call to patient to advise his surgeon needs to sent a new clearance request. Patient verbalizes understanding.

## 2023-04-22 NOTE — Telephone Encounter (Signed)
Patient calling in regarding dental clearance that was put on hold. He states would like dental clearance restarted as he would like to proceed with dental procedure. Per telephone note 02/04/23, it looks like his procedure was delayed at one point. Patient's next follow up with Dr. Mayford Knife is in March.

## 2023-04-22 NOTE — Telephone Encounter (Signed)
Patient stated he wants a call back from Dr. Norris Cross nurse regarding a dental procedure.

## 2023-04-22 NOTE — Telephone Encounter (Signed)
Patient is requesting refill of his Omega 3 fatty acids which he states he pays cash for. He states he is currently uninsured and he has been requesting refills but they are coming back as "denied" from out office. Script has note that says "Please call our office to schedule an yearly appointment with Dr. Mayford Knife for May 2024 before anymore refills. [phone]. Thank you 1st attempt". However, patient was seen by Dr. Mayford Knife 11/01/22. Message to Dr. Mayford Knife to see if ok to refill.

## 2023-05-13 ENCOUNTER — Telehealth: Payer: Self-pay | Admitting: *Deleted

## 2023-05-13 NOTE — Telephone Encounter (Signed)
   Pre-operative Risk Assessment    Patient Name: Colton [name].  DOB: 1955-10-20 MRN: [id number]  DATE OF LAST VISIT: 11/01/22 DR. TURNER DATE OF NEXT VISIT: 07/28/23 DR. TURNER   Request for Surgical Clearance    Procedure: PER REQUEST SENT TODAY PROCEDURE: REMOVAL OF TEETH AND IMMEDIATE IMPLANT PLACEMENT (5-6);   I DID CALL THE DDS OFFICE TODAY AND DID CONFIRM ORIGINAL PROCEDURE FROM 01/2023 STILL TO BE DONE; REPLY BACK TO ME WAS YES STILL SAME PROCEDURE AS PLANNED IN 01/2023; 8 UPPER TEETH EXTRACTED (2 SURGICAL AND 6 SIMPLE EXTRACTIONS) BONE REDUCTION 6 DENTAL IMPLANTS TO PREPARE FOR DENTURES    Date of Surgery:  Clearance TBD                                 Surgeon:  DR. Cherly Anderson, DDS Surgeon's Group or Practice Name:  [facility] Phone number:  [phone] Fax number:  [phone]   Type of Clearance Requested:   - Medical  - Pharmacy:  Hold Aspirin and Clopidogrel (Plavix)   DR. MOHORN, DDS HAS REQUESTED ON CLEARANCE FORM: "CAN YOU COMMENT ON PURPOSE OF PET SCAN AND RESULTS. PLEASE CONFIRM STATUS OF CV SYSTEM. PLEASE CONFIRM NO H/O CHF. ABOVE PROCEDURE TAKE 2-3 HOURS, EXTENSIVE BONE REDUCTION   Type of Anesthesia:   LIGHT IV SEDATION   Additional requests/questions:    Colton [name]   05/13/2023, 11:01 AM

## 2023-05-14 ENCOUNTER — Telehealth: Payer: Self-pay

## 2023-05-14 NOTE — Telephone Encounter (Signed)
   Name: Colton [name].  DOB: 07-06-1955  MRN: [id number]  Primary Cardiologist: Armanda Magic, MD   Preoperative team, please contact this patient and set up a phone call appointment for further preoperative risk assessment. Please obtain consent and complete medication review. Thank you for your help.  I confirm that guidance regarding antiplatelet and oral anticoagulation therapy has been completed and, if necessary, noted below.  His aspirin may be held for 5-7 days prior to his procedure.  His Plavix may be held for 5 days prior to his procedure.  Please resume as soon as hemostasis is achieved.  I also confirmed the patient resides in the state of [place]. As per [facility] Medical Board telemedicine laws, the patient must reside in the state in which the provider is licensed.   [name], NP 05/14/2023, 8:03 AM [facility]

## 2023-05-14 NOTE — Telephone Encounter (Signed)
  Patient Consent for Virtual Visit         [name] has provided verbal consent on 05/14/2023 for a virtual visit (video or telephone).   CONSENT FOR VIRTUAL VISIT FOR:  Colton [name].  By participating in this virtual visit I agree to the following:  I hereby voluntarily request, consent and authorize [facility] and its employed or contracted physicians, physician assistants, nurse practitioners or other licensed health care professionals (the Practitioner), to provide me with telemedicine health care services (the "Services") as deemed necessary by the treating Practitioner. I acknowledge and consent to receive the Services by the Practitioner via telemedicine. I understand that the telemedicine visit will involve communicating with the Practitioner through live audiovisual communication technology and the disclosure of certain medical information by electronic transmission. I acknowledge that I have been given the opportunity to request an in-person assessment or other available alternative prior to the telemedicine visit and am voluntarily participating in the telemedicine visit.  I understand that I have the right to withhold or withdraw my consent to the use of telemedicine in the course of my care at any time, without affecting my right to future care or treatment, and that the Practitioner or I may terminate the telemedicine visit at any time. I understand that I have the right to inspect all information obtained and/or recorded in the course of the telemedicine visit and may receive copies of available information for a reasonable fee.  I understand that some of the potential risks of receiving the Services via telemedicine include:  Delay or interruption in medical evaluation due to technological equipment failure or disruption; Information transmitted may not be sufficient (e.g. poor resolution of images) to allow for appropriate medical decision making by the  Practitioner; and/or  In rare instances, security protocols could fail, causing a breach of personal health information.  Furthermore, I acknowledge that it is my responsibility to provide information about my medical history, conditions and care that is complete and accurate to the best of my ability. I acknowledge that Practitioner's advice, recommendations, and/or decision may be based on factors not within their control, such as incomplete or inaccurate data provided by me or distortions of diagnostic images or specimens that may result from electronic transmissions. I understand that the practice of medicine is not an exact science and that Practitioner makes no warranties or guarantees regarding treatment outcomes. I acknowledge that a copy of this consent can be made available to me via my patient portal [facility] MyChart), or I can request a printed copy by calling the office of [facility].    I understand that my insurance will be billed for this visit.   I have read or had this consent read to me. I understand the contents of this consent, which adequately explains the benefits and risks of the Services being provided via telemedicine.  I have been provided ample opportunity to ask questions regarding this consent and the Services and have had my questions answered to my satisfaction. I give my informed consent for the services to be provided through the use of telemedicine in my medical care

## 2023-05-14 NOTE — Telephone Encounter (Signed)
Patient schedule for tele visit on 06/04/23. Med rec and consent done

## 2023-06-01 ENCOUNTER — Other Ambulatory Visit: Payer: Self-pay | Admitting: Family Medicine

## 2023-06-02 NOTE — Telephone Encounter (Signed)
 Per patient: My foot problem has come back, making an appoinment for a podiatryst , will use jntil then and i also need this for my gout, can you double the amount so i dknt have to bother you , thanks

## 2023-06-03 MED ORDER — PREDNISONE 10 MG PO TABS: ORAL_TABLET | ORAL | 0 refills | Status: DC

## 2023-06-03 NOTE — Telephone Encounter (Signed)
 Spoke with patient to advise rx has been sent. Patient is also scheduled to be seen on 06/30/23

## 2023-06-03 NOTE — Telephone Encounter (Signed)
 Rx sent.  I wouldn't inc the dose given his hx of diabetes.  He needs DM2 f/u set at the end of the month or early Feb 2025.

## 2023-06-04 ENCOUNTER — Ambulatory Visit: Payer: Self-pay | Attending: Cardiology | Admitting: Student

## 2023-06-04 DIAGNOSIS — Z01810 Encounter for preprocedural cardiovascular examination: Secondary | ICD-10-CM

## 2023-06-04 NOTE — Progress Notes (Signed)
 Virtual Visit via Telephone Note   Because of Colton Gasparini Jr.'s co-morbid illnesses, he is at least at moderate risk for complications without adequate follow up.  This format is felt to be most appropriate for this patient at this time.  The patient did not have access to video technology/had technical difficulties with video requiring transitioning to audio format only (telephone).  All issues noted in this document were discussed and addressed.  No physical exam could be performed with this format.  Please refer to the patient's chart for his consent to telehealth for [facility].  Evaluation Performed:  Preoperative cardiovascular risk assessment _____________   Date:  06/04/2023   Patient ID:  Colton Canary Raddle., DOB 1955-11-09, MRN [id number] Patient Location:  Home Provider location:   Office  Primary Care Provider:  Cleatus Arlyss RAMAN, MD Primary Cardiologist:  Wilbert Bihari, MD  Chief Complaint / Patient Profile   68 y.o. y/o male with a h/o CAD s/p PCI with DES to mid LCx September 2017, chronic systolic heart failure with recovered LV function (echo September 2024 showed EF 55 to 60%, hypertension, hyperlipidemia, CVA June 2017, GERD, T2DM who is pending extraction of 8 upper teeth (2 surgical and 6 simple), bone reduction, 6 dental implants to prepare for dentures by Dr. Dorthula and presents today for telephonic preoperative cardiovascular risk assessment.  History of Present Illness    Colton Bolyard. is a 68 y.o. male who presents via audio/video conferencing for a telehealth visit today.  Cardiac PET stress October 2024 showed no ischemia or infarction, intermediate risk secondary to mildly reduced myocardial blood flow reserve and borderline abnormal resting EF.  Pt was last seen in cardiology clinic on 11/01/2022 by Dr. Bihari.  At that time Colton Cabello. was stable from a cardiac standpoint.  The patient is now pending procedure as outlined above. Since  his last visit, he is doing well. Patient denies lower extremity edema, orthopnea or PND. He has baseline shortness of breath that is unchanged from previous. No chest pain, pressure, or tightness. No palpitations.  He is active walking on the treadmill for 20 minutes daily.   Past Medical History    Past Medical History:  Diagnosis Date   Coronary artery disease    cath 01/2016 with 60% LAD with FFR 0.82 and 80% mid LCX s/p PCI   Diabetes mellitus without complication (HCC)    Dilated cardiomyopathy (HCC)    EF 35-40% by [id number] and 42% by cardiac MR.  EF normalized on echo 02/2020   Discoid lupus    Fatty liver    Former smoker    Gout 07/2001   [facility]   History of gout    Hyperlipidemia LDL goal <70    Hypertension    Rosacea    Stroke [facility])    s/p tpa 2017 w/o residual defecit   Past Surgical History:  Procedure Laterality Date   CARDIAC CATHETERIZATION N/A 02/06/2016   Procedure: Left Heart Cath and Coronary Angiography;  Surgeon: Candyce RAMAN Reek, MD;  Location: [facility] INVASIVE CV LAB;  Service: Cardiovascular;  Laterality: N/A;   CARDIAC CATHETERIZATION N/A 02/06/2016   Procedure: Coronary Stent Intervention;  Surgeon: Candyce RAMAN Reek, MD;  Location: [facility] INVASIVE CV LAB;  Service: Cardiovascular;  Laterality: N/A;   CARDIAC CATHETERIZATION N/A 02/06/2016   Procedure: Intravascular Pressure Wire/FFR Study;  Surgeon: Candyce RAMAN Reek, MD;  Location: [facility] INVASIVE CV LAB;  Service: Cardiovascular;  Laterality: N/A;   Cardiolite  08/16/2008   [facility]    Allergies  Allergies  Allergen Reactions   Iodine Other (See Comments)    Gout    Latex Rash    Home Medications    Prior to Admission medications   Medication Sig Start Date End Date Taking? Authorizing Provider  aspirin  81 MG chewable tablet Chew 1 tablet (81 mg total) by mouth daily. 09/14/19   Shlomo Wilbert SAUNDERS, MD  atorvastatin  (LIPITOR) 20 MG tablet Take 1 tablet (20 mg total) by mouth  daily. 11/01/22   Shlomo Wilbert SAUNDERS, MD  carvedilol  (COREG ) 6.25 MG tablet Take 1 tablet (6.25 mg total) by mouth 2 (two) times daily. 11/01/22   Shlomo Wilbert SAUNDERS, MD  clopidogrel  (PLAVIX ) 75 MG tablet Take 1 tablet (75 mg total) by mouth daily. 11/01/22   Shlomo Wilbert SAUNDERS, MD  colchicine  0.6 MG tablet Take 0.5 tablets (0.3 mg total) by mouth daily as needed (for gout). 12/06/22   Cleatus Arlyss RAMAN, MD  fish oil -omega-3 fatty acids  1000 MG capsule Take 2 capsules (2 g total) by mouth daily. 04/22/23   Shlomo Wilbert SAUNDERS, MD  HYDROcodone -acetaminophen  (NORCO/VICODIN) 5-325 MG tablet Take 1 tablet by mouth every 6 (six) hours as needed for severe pain (for gout). 01/02/23   Cleatus Arlyss RAMAN, MD  nitroGLYCERIN  (NITROSTAT ) 0.4 MG SL tablet Place 1 tablet (0.4 mg total) under the tongue every 5 (five) minutes as needed for chest pain. 08/17/20   Shlomo Wilbert SAUNDERS, MD  omega-3 acid ethyl esters (LOVAZA ) 1 g capsule TAKE 2 CAPSULES BY MOUTH EVERY MORNING AND TAKE TWO CAPSULES BY MOUTH EVERY EVENING *KEEP UPCOMING APPOINTMENT FOR FURTHER REFILLS* 04/22/23   Shlomo Wilbert SAUNDERS, MD  predniSONE  (DELTASONE ) 10 MG tablet Take 2 a day for 5 days, then 1 a day for 5 days, with food. Don't take with aleve/ibuprofen.  Use only if colchicine  doesn't help with gout. 06/03/23   Cleatus Arlyss RAMAN, MD  sacubitril -valsartan  (ENTRESTO ) 97-103 MG Take 1 tablet by mouth 2 (two) times daily. 11/01/22   Shlomo Wilbert SAUNDERS, MD  spironolactone  (ALDACTONE ) 25 MG tablet TAKE 1/2 TABLET BY MOUTH DAILY. 11/01/22   Shlomo Wilbert SAUNDERS, MD  Tetrahydrozoline HCl (VISINE OP) Place 1 drop into both eyes as needed (for dry eyes).    [provider]  Vitamin D , Cholecalciferol , 25 MCG (1000 UT) CAPS Take 1,000 Units by mouth daily. 01/24/20   Cleatus Arlyss RAMAN, MD    Physical Exam    Vital Signs:  Colton Evalene Raddle. does not have vital signs available for review today.  Given telephonic nature of communication, physical exam is limited. AAOx3. NAD. Normal affect.   Speech and respirations are unlabored.  Accessory Clinical Findings    Cardiac Studies & Procedures     STRESS TESTS  NM PET CT CARDIAC PERFUSION MULTI W/ABSOLUTE BLOODFLOW 02/27/2023  Narrative   Findings are consistent with no ischemia and no infarction. The study is intermediate risk due to mildly reduced myocardial blood flow reserve and borderline abnormal resting ejection fraction.   LV perfusion is normal. There is no evidence of ischemia. There is no evidence of infarction.   Rest left ventricular function is abnormal. Rest global function is mildly reduced. Rest EF: 49%. Stress left ventricular function is normal. Stress EF: 57%. End diastolic cavity size is normal. End systolic cavity size is normal. No evidence of transient ischemic dilation (TID) noted (1.10).   Myocardial blood flow was computed to be 0.75ml/g/min at rest and 1.46ml/g/min at  stress. Global myocardial blood flow reserve was 1.95 and was mildly abnormal.   Coronary calcium  assessment not performed due to prior revascularization.  CLINICAL DATA:  This over-read does not include interpretation of cardiac or coronary anatomy or pathology. The Cardiac PET CT interpretation by the cardiologist is attached.  COMPARISON:  CT heart dated January 31, 2016  FINDINGS: Vascular: Normal heart size. No pericardial effusion. Normal caliber thoracic aorta with mild atherosclerotic disease. Moderate coronary artery calcifications.  Mediastinum/Nodes: Esophagus unremarkable. No enlarged lymph nodes seen in the chest.  Lungs/Pleura: Central airways are patent. Moderate centrilobular emphysema. New small solid pulmonary nodule of the left upper lobe measuring 6 mm on series 4, image 46. No consolidation, pleural effusion or pneumothorax.  Upper Abdomen: Hepatic steatosis.  Musculoskeletal: No aggressive appearing osseous lesions.  IMPRESSION: 1. New small solid pulmonary nodule of the left upper lobe measuring 6 mm.  Non-contrast chest CT at 6-12 months is recommended. If the nodule is stable at time of repeat CT, then future CT at 18-24 months (from today's scan) is considered optional for low-risk patients, but is recommended for high-risk patients. This recommendation follows the consensus statement: Guidelines for Management of Incidental Pulmonary Nodules Detected on CT Images: From the Fleischner Society 2017; Radiology 2017; 284:228-243. 2. Hepatic steatosis. 3. Aortic Atherosclerosis (ICD10-I70.0) and Emphysema (ICD10-J43.9).   Electronically Signed By: Rea Marc M.D. On: 02/27/2023 12:34  ECHOCARDIOGRAM  ECHOCARDIOGRAM COMPLETE 01/31/2023  Narrative ECHOCARDIOGRAM REPORT  Patient Name:   Colton Nelles. Date of Exam: 01/31/2023 Medical Rec #:  [id number]           Height:       72.0 in Accession #:    [id number]          Weight:       311.0 lb Date of Birth:  Mar 03, 1956          BSA:          2.570 m Patient Age:    74 years            BP:           120/82 mmHg Patient Gender: M                   HR:           70 bpm. Exam Location:  [facility]  Procedure: Cardiac Doppler, Color Doppler, 2D Echo and 3D Echo  Indications:    Dyspnea R06.00  History:        Patient has prior history of Echocardiogram examinations, most recent 02/28/2020. CAD; Risk Factors:Hypertension, Dyslipidemia and Diabetes.  Sonographer:    Augustin Seals RDCS Referring Phys: [phone] TRACI R TURNER  IMPRESSIONS   1. Left ventricular ejection fraction, by estimation, is 55 to 60%. Left ventricular ejection fraction by 3D volume is 56 %. The left ventricle has normal function. The left ventricle has no regional wall motion abnormalities. Left ventricular diastolic parameters are consistent with Grade I diastolic dysfunction (impaired relaxation). 2. Right ventricular systolic function is normal. The right ventricular size is normal. A Prominent moderator band is visualized. Tricuspid regurgitation  signal is inadequate for assessing PA pressure. 3. The mitral valve is normal in structure. No evidence of mitral valve regurgitation. No evidence of mitral stenosis. 4. The aortic valve is tricuspid. Aortic valve regurgitation is not visualized. Aortic valve sclerosis/calcification is present, without any evidence of aortic stenosis. 5. The inferior vena cava is normal in size with greater than 50%  respiratory variability, suggesting right atrial pressure of 3 mmHg. 6. Ascending aorta measurements are within normal limits for age when indexed to body surface area.           Assessment & Plan    Primary Cardiologist: Wilbert Bihari, MD  Preoperative cardiovascular risk assessment.  Extraction of 8 upper teeth (2 surgical and 6 simple), bone reduction, 6 dental implants to prepare for dentures by Dr. Mohorn.  Chart reviewed as part of pre-operative protocol coverage. According to the RCRI, patient has a 6.6% risk of MACE. Patient reports activity equivalent to >4.0 METS (walks on the treadmill for 20 minutes daily).   Given past medical history and time since last visit, based on ACC/AHA guidelines, Colton Garrette. would be at acceptable risk for the planned procedure without further cardiovascular testing.   Patient was advised that if he develops new symptoms prior to surgery to contact our office to arrange a follow-up appointment.  he verbalized understanding.  Per office protocol, he may hold Plavix  for 5 days and aspirin  5-7 days prior to procedure and should resume as soon as hemodynamically stable postoperatively.   I will route this recommendation to the requesting party via Epic fax function.  Please call with questions.  Time:   Today, I have spent 6 minutes with the patient with telehealth technology discussing medical history, symptoms, and management plan.     Barnie Hila, NP  06/04/2023, 7:58 AM

## 2023-06-30 ENCOUNTER — Encounter: Payer: Self-pay | Admitting: Family Medicine

## 2023-06-30 ENCOUNTER — Ambulatory Visit (INDEPENDENT_AMBULATORY_CARE_PROVIDER_SITE_OTHER): Payer: Self-pay | Admitting: Family Medicine

## 2023-06-30 VITALS — BP 110/58 | HR 74 | Temp 98.3°F | Ht 72.0 in | Wt 313.0 lb

## 2023-06-30 DIAGNOSIS — E119 Type 2 diabetes mellitus without complications: Secondary | ICD-10-CM

## 2023-06-30 DIAGNOSIS — Z7984 Long term (current) use of oral hypoglycemic drugs: Secondary | ICD-10-CM

## 2023-06-30 DIAGNOSIS — R911 Solitary pulmonary nodule: Secondary | ICD-10-CM

## 2023-06-30 DIAGNOSIS — Z1211 Encounter for screening for malignant neoplasm of colon: Secondary | ICD-10-CM

## 2023-06-30 LAB — POCT GLYCOSYLATED HEMOGLOBIN (HGB A1C): Hemoglobin A1C: 8 % — AB (ref 4.0–5.6)

## 2023-06-30 MED ORDER — METFORMIN HCL 500 MG PO TABS: ORAL_TABLET | ORAL | 1 refills | Status: DC

## 2023-06-30 MED ORDER — PREDNISONE 10 MG PO TABS: ORAL_TABLET | ORAL | 0 refills | Status: DC

## 2023-06-30 NOTE — Patient Instructions (Signed)
Recheck in 3 months.  A1c at the visit like today.  Plan for CT in April.  Cologuard ordered.  Start metformin.  Take care.  Glad to see you. Use the eat right diet.

## 2023-06-30 NOTE — Progress Notes (Unsigned)
Diabetes:  No meds.   Hypoglycemic episodes: not checked.  Hyperglycemic episodes: not checked.   Feet problems: no Blood Sugars averaging: not checked.   eye exam within last year: yes A1c 8.  D/w pt.  He plans to start a diet in March after his work situation improves.    He hasn't had to use prednisone recently for gout, last use about 2 months.  He lost rx in the meantime.    D/w pt about low carb diet vs Dm2 education.  He is looking into a meal planning service.    Cologuard ordered, d/w pt about screening.    D/w pt about lung nodule f/u.  He needs a follow up CT scan at 6 months for a lung nodule and he should get a call about that in April, he can contact the clinic here.     Meds, vitals, and allergies reviewed.   ROS: Per HPI unless specifically indicated in ROS section   GEN: nad, alert and oriented HEENT: ncat NECK: supple w/o LA CV: rrr. PULM: ctab, no inc wob ABD: soft, +bs EXT: no edema SKIN: well perfused.

## 2023-07-02 NOTE — Assessment & Plan Note (Signed)
 He needs a follow up CT scan at 6 months for a lung nodule and he should get a call about that in April, he can contact the clinic here if needed.

## 2023-07-02 NOTE — Assessment & Plan Note (Signed)
 Cologuard ordered, d/w pt about screening.

## 2023-07-02 NOTE — Assessment & Plan Note (Signed)
 A1c 8.  D/w pt.  He plans to start a diet in March after his work situation improves.    D/w pt about low carb diet vs Dm2 education.  He is looking into a meal planning service.    Start metformin  500mg  with routine cautions, increase to 500mg  BID if tolerated.  Recheck periodically.

## 2023-07-04 ENCOUNTER — Telehealth: Payer: Self-pay | Admitting: *Deleted

## 2023-07-04 NOTE — Telephone Encounter (Signed)
 Our office received a duplicate request yesterday for procedure that we cleared the pt for on 06/04/23. Morey Ar, NP cleared pt 06/04/23. I will be happy to re-fax notes to fax # on clearance form [phone]

## 2023-07-08 ENCOUNTER — Ambulatory Visit: Payer: Self-pay | Attending: Cardiology | Admitting: Cardiology

## 2023-07-08 ENCOUNTER — Encounter: Payer: Self-pay | Admitting: Cardiology

## 2023-07-08 VITALS — BP 102/60 | HR 60 | Ht 72.0 in | Wt 314.6 lb

## 2023-07-08 DIAGNOSIS — I10 Essential (primary) hypertension: Secondary | ICD-10-CM

## 2023-07-08 DIAGNOSIS — I2510 Atherosclerotic heart disease of native coronary artery without angina pectoris: Secondary | ICD-10-CM

## 2023-07-08 DIAGNOSIS — I420 Dilated cardiomyopathy: Secondary | ICD-10-CM

## 2023-07-08 DIAGNOSIS — E785 Hyperlipidemia, unspecified: Secondary | ICD-10-CM

## 2023-07-08 MED ORDER — SACUBITRIL-VALSARTAN 97-103 MG PO TABS: 1.0000 | ORAL_TABLET | Freq: Two times a day (BID) | ORAL | 3 refills | Status: AC

## 2023-07-08 MED ORDER — SPIRONOLACTONE 25 MG PO TABS: ORAL_TABLET | ORAL | 3 refills | Status: AC

## 2023-07-08 MED ORDER — CARVEDILOL 6.25 MG PO TABS: 6.2500 mg | ORAL_TABLET | Freq: Two times a day (BID) | ORAL | 3 refills | Status: DC

## 2023-07-08 MED ORDER — CLOPIDOGREL BISULFATE 75 MG PO TABS: 75.0000 mg | ORAL_TABLET | Freq: Every day | ORAL | 3 refills | Status: DC

## 2023-07-08 MED ORDER — OMEGA-3-ACID ETHYL ESTERS 1 G PO CAPS: ORAL_CAPSULE | ORAL | 3 refills | Status: DC

## 2023-07-08 MED ORDER — ATORVASTATIN CALCIUM 20 MG PO TABS: 20.0000 mg | ORAL_TABLET | Freq: Every day | ORAL | 3 refills | Status: DC

## 2023-07-08 NOTE — Addendum Note (Signed)
Addended by: Erick Alley on: 07/08/2023 02:04 PM   Modules accepted: Orders

## 2023-07-08 NOTE — Addendum Note (Signed)
Addended by: Erick Alley on: 07/08/2023 02:12 PM   Modules accepted: Orders

## 2023-07-08 NOTE — Patient Instructions (Signed)
Medication Instructions:  Your physician recommends that you continue on your current medications as directed. Please refer to the Current Medication list given to you today.  *If you need a refill on your cardiac medications before your next appointment, please call your pharmacy*   Lab Work: CMET, Lipids If you have labs (blood work) drawn today and your tests are completely normal, you will receive your results only by: MyChart Message (if you have MyChart) OR A paper copy in the mail If you have any lab test that is abnormal or we need to change your treatment, we will call you to review the results.   Testing/Procedures: None   Follow-Up: At [facility], you and your health needs are our priority.  As part of our continuing mission to provide you with exceptional heart care, we have created designated Provider Care Teams.  These Care Teams include your primary Cardiologist (physician) and Advanced Practice Providers (APPs -  Physician Assistants and Nurse Practitioners) who all work together to provide you with the care you need, when you need it.  We recommend signing up for the patient portal called "MyChart".  Sign up information is provided on this After Visit Summary.  MyChart is used to connect with patients for Virtual Visits (Telemedicine).  Patients are able to view lab/test results, encounter notes, upcoming appointments, etc.  Non-urgent messages can be sent to your provider as well.   To learn more about what you can do with MyChart, go to [email or web address].    Your next appointment:   1 year(s)  Provider:   Armanda Magic, MD     Other Instructions   1st Floor: - Lobby - Registration  - Pharmacy  - Lab - Cafe  2nd Floor: - PV Lab - Diagnostic Testing (echo, CT, nuclear med)  3rd Floor: - Vacant  4th Floor: - TCTS (cardiothoracic surgery) - AFib Clinic - Structural Heart Clinic - Vascular Surgery  - Vascular Ultrasound  5th  Floor: - [facility] (general and EP) - Clinical Pharmacy for coumadin, hypertension, lipid, weight-loss medications, and med management appointments    Valet parking services will be available as well.

## 2023-07-08 NOTE — Progress Notes (Signed)
Cardiology Office Note:    Date:  07/08/2023   ID:  Colton Bors., DOB [birth year], MRN [id number]  PCP:  Joaquim Nam, MD  Cardiologist:  Armanda Magic, MD    Referring MD: Joaquim Nam, MD   Chief Complaint  Patient presents with   Coronary Artery Disease   Hypertension   Hyperlipidemia    History of Present Illness:    Colton Gladman. is a 68 y.o. male with a hx of CVA 10/2015 treated with IV TPA,  moderate LV dysfunction with EF 35-40%, ASCAD with cath 02/06/16 showing 80% mid CFX lesion s/p DES with residual 60% LAD lesion, not significant by FFR.  The pt declined high dose statin Rx.   Repeat 2D echo 04/10/2016 showed EF 35-40% with diffuse HK.  Cardiac MRI showed EF 42% with HK of the mid anterior wall and late gad uptake in the endocardium of the basal and mid anterior walls c/w prior non-transmural infarct. 2D echo done 02/2020 showed normalization of LVF with mildly thickened heart muscle and G2DD with mild AVSC. 2D echo 01/2023 with EF 55-60%, G1DD.  He is here today for followup and is doing well.  He has chronic SOB related to obesity that is stable.  He denies any chest pain or pressure, PND, orthopnea, LE edema, palpitations or syncope. He has some problems with vertigo from time to time. He is compliant with his meds and is tolerating meds with no SE.     Past Medical History:  Diagnosis Date   Coronary artery disease    cath 01/2016 with 60% LAD with FFR 0.82 and 80% mid LCX s/p PCI   Diabetes mellitus without complication (HCC)    Dilated cardiomyopathy (HCC)    EF 35-40% by [id number] and 42% by cardiac MR.  EF normalized on echo 02/2020   Discoid lupus    Fatty liver    Former smoker    Gout 07/2001   [facility]   History of gout    Hyperlipidemia LDL goal <70    Hypertension    Rosacea    Stroke [facility])    s/p tpa 2017 w/o residual defecit    Past Surgical History:  Procedure Laterality Date   CARDIAC CATHETERIZATION N/A 02/06/2016    Procedure: Left Heart Cath and Coronary Angiography;  Surgeon: Corky Crafts, MD;  Location: [facility] INVASIVE CV LAB;  Service: Cardiovascular;  Laterality: N/A;   CARDIAC CATHETERIZATION N/A 02/06/2016   Procedure: Coronary Stent Intervention;  Surgeon: Corky Crafts, MD;  Location: [facility] INVASIVE CV LAB;  Service: Cardiovascular;  Laterality: N/A;   CARDIAC CATHETERIZATION N/A 02/06/2016   Procedure: Intravascular Pressure Wire/FFR Study;  Surgeon: Corky Crafts, MD;  Location: [facility] INVASIVE CV LAB;  Service: Cardiovascular;  Laterality: N/A;   Cardiolite  08/16/2008   [facility]    Current Medications: Current Meds  Medication Sig   aspirin 81 MG chewable tablet Chew 1 tablet (81 mg total) by mouth daily.   atorvastatin (LIPITOR) 20 MG tablet Take 1 tablet (20 mg total) by mouth daily.   carvedilol (COREG) 6.25 MG tablet Take 1 tablet (6.25 mg total) by mouth 2 (two) times daily.   clopidogrel (PLAVIX) 75 MG tablet Take 1 tablet (75 mg total) by mouth daily.   colchicine 0.6 MG tablet Take 0.5 tablets (0.3 mg total) by mouth daily as needed (for gout).   fish oil-omega-3 fatty acids 1000 MG capsule Take 2 capsules (2 g total) by mouth daily.  HYDROcodone-acetaminophen (NORCO/VICODIN) 5-325 MG tablet Take 1 tablet by mouth every 6 (six) hours as needed for severe pain (for gout).   metFORMIN (GLUCOPHAGE) 500 MG tablet 1 tab a day for 1 week then 1 twice a day.   nitroGLYCERIN (NITROSTAT) 0.4 MG SL tablet Place 1 tablet (0.4 mg total) under the tongue every 5 (five) minutes as needed for chest pain.   omega-3 acid ethyl esters (LOVAZA) 1 g capsule TAKE 2 CAPSULES BY MOUTH EVERY MORNING AND TAKE TWO CAPSULES BY MOUTH EVERY EVENING *KEEP UPCOMING APPOINTMENT FOR FURTHER REFILLS*   predniSONE (DELTASONE) 10 MG tablet Take 2 a day for 5 days, then 1 a day for 5 days, with food. Don't take with aleve/ibuprofen.  Use only if colchicine doesn't help with gout.   sacubitril-valsartan  (ENTRESTO) 97-103 MG Take 1 tablet by mouth 2 (two) times daily.   spironolactone (ALDACTONE) 25 MG tablet TAKE 1/2 TABLET BY MOUTH DAILY.   Tetrahydrozoline HCl (VISINE OP) Place 1 drop into both eyes as needed (for dry eyes).   Vitamin D, Cholecalciferol, 25 MCG (1000 UT) CAPS Take 1,000 Units by mouth daily.     Allergies:   Iodine and Latex   Social History   Socioeconomic History   Marital status: Widowed    Spouse name: Not on file   Number of children: 0   Years of education: Not on file   Highest education level: Bachelor's degree (e.g., BA, AB, BS)  Occupational History   Occupation: Property Patents/Inventions/Patents  Tobacco Use   Smoking status: Former    Current packs/day: 0.00    Average packs/day: 2.0 packs/day for 45.0 years (90.0 ttl pk-yrs)    Types: Cigarettes    Start date: 10/28/1970    Quit date: 10/28/2015    Years since quitting: 7.6   Smokeless tobacco: Never  Vaping Use   Vaping status: Never Used  Substance and Sexual Activity   Alcohol use: Yes    Alcohol/week: 2.0 standard drinks of alcohol    Types: 1 Glasses of wine, 1 Shots of liquor per week   Drug use: No   Sexual activity: Not Currently  Other Topics Concern   Not on file  Social History Narrative   [employer] '78-'79   Prev investment banking, now trades crude oil internationally   Widowed, wife died of mesothelioma   No kids   Social Drivers of Corporate investment banker Strain: Low Risk  (06/29/2023)   Overall Financial Resource Strain (CARDIA)    Difficulty of Paying Living Expenses: Not hard at all  Food Insecurity: No Food Insecurity (06/29/2023)   Hunger Vital Sign    Worried About Running Out of Food in the Last Year: Never true    Ran Out of Food in the Last Year: Never true  Transportation Needs: No Transportation Needs (06/29/2023)   PRAPARE - Administrator, Civil Service (Medical): No    Lack of Transportation (Non-Medical): No  Physical Activity: Insufficiently  Active (06/29/2023)   Exercise Vital Sign    Days of Exercise per Week: 5 days    Minutes of Exercise per Session: 20 min  Stress: Stress Concern Present (06/29/2023)   [employer] of Occupational Health - Occupational Stress Questionnaire    Feeling of Stress : To some extent  Social Connections: Moderately Isolated (06/29/2023)   Social Connection and Isolation Panel [NHANES]    Frequency of Communication with Friends and Family: More than three times a week  Frequency of Social Gatherings with Friends and Family: More than three times a week    Attends Religious Services: More than 4 times per year    Active Member of [employer] or Organizations: No    Attends Banker Meetings: Not on file    Marital Status: Widowed     Family History: The patient's family history includes CAD in his father and another family member; Heart disease in his father and another family member; Lung cancer in an other family member. There is no history of Colon cancer or Prostate cancer.  ROS:   Please see the history of present illness.    ROS  All other systems reviewed and negative.   EKGs/Labs/Other Studies Reviewed:    The following studies were reviewed today:   Recent Labs: 11/01/2022: ALT 31; BUN 15; Creatinine, Ser 1.19; Potassium 4.5; Sodium 137 12/12/2022: TSH 1.14   Recent Lipid Panel    Component Value Date/Time   CHOL 126 11/22/2022 0720   TRIG 154 (H) 11/22/2022 0720   HDL 31 (L) 11/22/2022 0720   CHOLHDL 4.1 11/22/2022 0720   CHOLHDL 5 08/27/2021 0802   VLDL 57.8 (H) 01/15/2021 1047   LDLCALC 68 11/22/2022 0720   LDLDIRECT 59.0 08/27/2021 0802    Physical Exam:    VS:  BP 102/60   Pulse 60   Ht 6' (1.829 m)   Wt (!) 314 lb 9.6 oz (142.7 kg)   SpO2 93%   BMI 42.67 kg/m     Wt Readings from Last 3 Encounters:  07/08/23 (!) 314 lb 9.6 oz (142.7 kg)  06/30/23 (!) 313 lb (142 kg)  03/21/23 (!) 317 lb 6 oz (144 kg)     GEN: Well nourished, well developed in  no acute distress HEENT: Normal NECK: No JVD; No carotid bruits LYMPHATICS: No lymphadenopathy CARDIAC:RRR, no murmurs, rubs, gallops RESPIRATORY:  Clear to auscultation without rales, wheezing or rhonchi  ABDOMEN: Soft, non-tender, non-distended MUSCULOSKELETAL:  No edema; No deformity  SKIN: Warm and dry NEUROLOGIC:  Alert and oriented x 3 PSYCHIATRIC:  Normal affect  ASSESSMENT:    1. DCM (dilated cardiomyopathy) (HCC)   2. Coronary artery disease involving native coronary artery of native heart without angina pectoris   3. Primary hypertension   4. Hyperlipidemia with target LDL less than 70      PLAN:    In order of problems listed above:  1.  DCM  -This is likely mixed ischemic/nonischemic.   -EF 35-40% by echo 2017 and 42% by cardiac MRI 05/2016.   -EF normalized at 60-65% by echo 02/2020 -echo 01/2023 with EF 55-60%  -He does not appear volume overloaded on exam today -Continue prescription drug management with carvedilol 6.25 mg twice daily, Entresto 97-23 mg twice daily and spironolactone 25 mg 1/2 tablet daily with as needed refills -Check bmet today   2.  ASCAD  - cath 02/06/16 showing 80% mid CFX lesion, treated with a DES, and a 60% LAD lesion, not significant by FFR.  -PET stress CT due to shortness of breath 03/16/2023 showed no ischemia or infarction and normal LV function with slightly reduced myocardial blood flow reserve.  Baseline EKG 49% but increased to 57% with stress -He denies any anginal symptoms -Continue prescription drug managed with aspirin 81 mg daily, atorvastatin 20 mg daily, Plavix 75 mg daily, carvedilol 6.25 mg twice daily with as needed refills  3 .  HTN  -BP is controlled on exam today -Continue prescription drug management  with spironolactone 12.5 mg daily, Entresto 97-23 mg twice daily, carvedilol 6.25 mg twice daily with as needed refills  4.  Hyperlipidemia -LDL goal < 70.  -Check FLP and CMET -Continue to atorvastatin 20 mg daily  with as needed refills    Medication Adjustments/Labs and Tests Ordered: Current medicines are reviewed at length with the patient today.  Concerns regarding medicines are outlined above.  No orders of the defined types were placed in this encounter.  No orders of the defined types were placed in this encounter.   Signed, Armanda Magic, MD  07/08/2023 1:47 PM    [facility]

## 2023-07-11 ENCOUNTER — Telehealth: Payer: Self-pay

## 2023-07-11 NOTE — Telephone Encounter (Signed)
Copied from CRM [id number]. Topic: Clinical - Prescription Issue >> Jul 11, 2023 10:25 AM Florestine Avers wrote: Reason for CRM: Patient called in requesting a call back from Dr. Armanda Heritage nurse. Its in regards to an issue with his prescription.

## 2023-07-11 NOTE — Telephone Encounter (Signed)
Patient has a few questions in reference to taking the metformin: He states that once in a while he may have to take a dose of his prednisone and he wants to know if that's okay with him taking metformin. I do know that the prednisone may increase his blood sugars but is there anything else he should know.   He additional question is can he have maybe one glass of wine a week at dinner with taking metformin. Please advise

## 2023-07-11 NOTE — Telephone Encounter (Signed)
1 glass of wine shouldn't be an issue.  I would try to limit prednisone use but it is still okay to take episodically if needed.  Both can increase his sugar, prednisone likely more than 1 glass of wine. Thanks.

## 2023-07-14 NOTE — Telephone Encounter (Signed)
 Patient notified

## 2023-07-28 ENCOUNTER — Ambulatory Visit: Payer: Self-pay | Admitting: Cardiology

## 2023-08-11 ENCOUNTER — Encounter: Payer: Self-pay | Admitting: Family Medicine

## 2023-08-11 ENCOUNTER — Ambulatory Visit (INDEPENDENT_AMBULATORY_CARE_PROVIDER_SITE_OTHER): Payer: Self-pay | Admitting: Family Medicine

## 2023-08-11 VITALS — BP 104/64 | HR 73 | Temp 98.5°F | Ht 72.0 in | Wt 312.6 lb

## 2023-08-11 DIAGNOSIS — R21 Rash and other nonspecific skin eruption: Secondary | ICD-10-CM

## 2023-08-11 DIAGNOSIS — Z7984 Long term (current) use of oral hypoglycemic drugs: Secondary | ICD-10-CM

## 2023-08-11 DIAGNOSIS — I10 Essential (primary) hypertension: Secondary | ICD-10-CM

## 2023-08-11 DIAGNOSIS — E119 Type 2 diabetes mellitus without complications: Secondary | ICD-10-CM

## 2023-08-11 LAB — COMPREHENSIVE METABOLIC PANEL
ALT: 23 U/L (ref 0–53)
AST: 18 U/L (ref 0–37)
Albumin: 4.6 g/dL (ref 3.5–5.2)
Alkaline Phosphatase: 61 U/L (ref 39–117)
BUN: 19 mg/dL (ref 6–23)
CO2: 25 meq/L (ref 19–32)
Calcium: 9.1 mg/dL (ref 8.4–10.5)
Chloride: 103 meq/L (ref 96–112)
Creatinine, Ser: 1.23 mg/dL (ref 0.40–1.50)
GFR: 60.4 mL/min (ref >60.00)
Glucose, Bld: 141 mg/dL — ABNORMAL HIGH (ref 70–99)
Potassium: 4.6 meq/L (ref 3.5–5.1)
Sodium: 136 meq/L (ref 135–145)
Total Bilirubin: 0.8 mg/dL (ref 0.2–1.2)
Total Protein: 6.5 g/dL (ref 6.0–8.3)

## 2023-08-11 LAB — LIPID PANEL
Cholesterol: 111 mg/dL (ref 0–200)
HDL: 26.4 mg/dL — ABNORMAL LOW (ref >39.00)
LDL Cholesterol: 42 mg/dL (ref 0–99)
NonHDL: 84.71
Total CHOL/HDL Ratio: 4
Triglycerides: 214 mg/dL — ABNORMAL HIGH (ref 0.0–149.0)
VLDL: 42.8 mg/dL — ABNORMAL HIGH (ref 0.0–40.0)

## 2023-08-11 MED ORDER — TRIAMCINOLONE ACETONIDE 0.5 % EX CREA: 1.0000 | TOPICAL_CREAM | Freq: Two times a day (BID) | CUTANEOUS | 1 refills | Status: DC | PRN

## 2023-08-11 MED ORDER — METFORMIN HCL 500 MG PO TABS: 500.0000 mg | ORAL_TABLET | Freq: Every day | ORAL | Status: DC

## 2023-08-11 NOTE — Progress Notes (Unsigned)
 Due for CMET and lipid, per cards.  Ordered.   He is fasting.  Original order per Dr. Mayford Knife.   Prev MALB d/w pt about corrected ratio <20 and already on valsartan.   He can take 1 metfomin a day, GI upset with 2 tabs.    Meds, vitals, and allergies reviewed.   ROS: Per HPI unless specifically indicated in ROS section    Distal darkening on the R 1st toenail- this looks like it should grow out.    No BLE edema.

## 2023-08-11 NOTE — Patient Instructions (Addendum)
 Go to the lab on the way out.   If you have mychart we'll likely use that to update you.     Take care.  Glad to see you.  Recheck A1c at a visit in May (after 09/27/23).  I would try triamcinolone cream on the rash.  Update [place] as needed.  If you have more leg swelling then let me know.

## 2023-08-13 ENCOUNTER — Telehealth: Payer: Self-pay | Admitting: Cardiology

## 2023-08-13 ENCOUNTER — Encounter: Payer: Self-pay | Admitting: Family Medicine

## 2023-08-13 ENCOUNTER — Telehealth: Payer: Self-pay

## 2023-08-13 DIAGNOSIS — R21 Rash and other nonspecific skin eruption: Secondary | ICD-10-CM | POA: Insufficient documentation

## 2023-08-13 DIAGNOSIS — E785 Hyperlipidemia, unspecified: Secondary | ICD-10-CM

## 2023-08-13 NOTE — Assessment & Plan Note (Signed)
 Recheck A1c at a visit in May (after 09/27/23).  Prev MALB d/w pt about corrected ratio <20 and already on valsartan.  He can take 1 metfomin a day, GI upset with 2 tabs.  Would continue metformin as is.

## 2023-08-13 NOTE — Telephone Encounter (Signed)
 Pt advised that we will keep an eye out for the forms and Dr Mayford Knife will be here tomorrow.

## 2023-08-13 NOTE — Telephone Encounter (Signed)
 New Message:     Patient wants Alcario Drought to know that he will be dropping off a form for her today. He says he need Dr Mayford Knife to sign it. It something for hie medicine.

## 2023-08-13 NOTE — Telephone Encounter (Signed)
 Paper Work Dropped Off:  Patient Assistance Paperwork   Date: 03/192025  Location of paper:  Dr. Norris Cross Box

## 2023-08-13 NOTE — Telephone Encounter (Signed)
Left message for the pt to call back.

## 2023-08-13 NOTE — Assessment & Plan Note (Signed)
 With recheck labs pending.  Routed to cardiology for management.

## 2023-08-13 NOTE — Telephone Encounter (Signed)
 Spoke with pt regarding his results. Pt stated he has tried taking Vascepa before and it caused a gout flare up. I told pt I would forward this information to Dr. Mayford Knife before making any medication changes or ordering labs and would get back to him after hearing from her. Pt also stated he dropped off patient assistance forms for Dr. Mayford Knife to sign. Pt verbalized understanding. All questions, if any, were answered.

## 2023-08-13 NOTE — Telephone Encounter (Signed)
-----   Message from Nurse Corky Crafts sent at 08/13/2023  3:29 PM EDT -----  ----- Message ----- From: Quintella Reichert, MD Sent: 08/13/2023   3:28 PM EDT To: Cheree Ditto, RPH; Cv Div Ch St Triage  Please have patient increase atorvastatin to 40mg  daily and change Lovaza to Vascepa 2gm BID.  Repeat FLP and ALT in 8 weeks ----- Message ----- From: Cheree Ditto, [facility] Sent: 08/13/2023   3:27 PM EDT To: Quintella Reichert, MD  Is patient willing to increase atorvastatin to 40mg ?  Also suggest switching Lovaza to Vascepa 2g BID with food ----- Message ----- From: Quintella Reichert, MD Sent: 08/13/2023   1:51 PM EDT To: Joaquim Nam, MD; Cv Div Pharmd  PHarmacy team can you give [place] some help with this patient's TAGS ----- Message ----- From: Joaquim Nam, MD Sent: 08/13/2023   1:17 PM EDT To: Quintella Reichert, MD  This patient needed to have labs drawn at our clinic.  The initial order was through your clinic.  I am routing this to you for input.  Thank you.

## 2023-08-13 NOTE — Assessment & Plan Note (Signed)
 He does not have lower extremity edema so this is less likely be venous stasis.  He could be an eczema variant.  Discussed options. I would try triamcinolone cream on the rash.  Update [place] as needed.  If more leg swelling then let me know.  He agrees.

## 2023-08-15 NOTE — Telephone Encounter (Signed)
 Pt aware Dr. Mayford Knife is not back in the office until next week.  MD can address/sign paperwork then. Forwarding to the nurse who is covering her next week to see if she can help ensure this gets signed. Pt aware we will update him once she signs forms. Patient verbalized understanding and agreeable to plan.

## 2023-08-15 NOTE — Addendum Note (Signed)
 Addended by: Erick Alley on: 08/15/2023 07:56 AM   Modules accepted: Orders

## 2023-08-15 NOTE — Telephone Encounter (Signed)
 Patient calling to see if forms are completed. Please advise

## 2023-08-15 NOTE — Telephone Encounter (Signed)
 A referral to lipid clinic was ordered and signed per Dr. Norris Cross request.

## 2023-08-19 ENCOUNTER — Telehealth: Payer: Self-pay | Admitting: Pharmacy Technician

## 2023-08-19 NOTE — Telephone Encounter (Signed)
 Provider page of patient assistance application signed by Dr. Mayford Knife and faxed to PAP Team.   In OnBase: [facility] Fax  Faxed: 08/19/2023 at 2:00 PM  Spoke with patient and informed him of the above. PAP Team will fax form to [employer]. Patient requests original form to be mailed back to him. Confirmed mailing address with patient:   [address] [place] [address]   Patient expressed appreciation for assistance.

## 2023-08-19 NOTE — Telephone Encounter (Signed)
 Provider form faxed to [employer] 08/19/23 4:56pm

## 2023-08-19 NOTE — Telephone Encounter (Signed)
 I faxed provider form to [employer] 4:56pm 08/19/23  Franchot Gallo, RN     08/19/23  2:14 PM Note Provider page of patient assistance application signed by Dr. Mayford Knife and faxed to P[employer] Team.    In OnBase: [facility] Fax   Faxed: 08/19/2023 at 2:00 PM   Spoke with patient and informed him of the above. PAP Team will fax form to [employer]. Patient requests original form to be mailed back to him. Confirmed mailing address with patient:    [address] [place] [address]    Patient expressed appreciation for assistance.       08/19/23  2:13 PM Franchot Gallo, RN attempted to contact Cleon Dew Jerilee Field. (Completed)     08/15/23  1:41 PM Price, Annita Brod, RN routed this conversation to Franchot Gallo, RN  Baird Lyons, RN     08/15/23  1:41 PM Note Pt aware Dr. Mayford Knife is not back in the office until next week.  MD can address/sign paperwork then. Forwarding to the nurse who is covering her next week to see if she can help ensure this gets signed. Pt aware we will update him once she signs forms. Patient verbalized understanding and agreeable to plan.           08/15/23  1:39 PM Price, Annita Brod, RN contacted [name]     08/15/23 12:48 PM Threasa Alpha A routed this conversation to [facility] Triage  Marin Comment   08/15/23 12:48 PM Note Patient calling to see if forms are completed. Please advise          08/15/23 12:48 PM Cleon Dew Jerilee Field contacted Terrance Mass, RN to Franchot Gallo, RN  Frutoso Schatz, RN     08/14/23  4:12 PM Forwarding this to both of you since you will be working in clinic with TT next week. Ann sent it to me, but didn't realize that today was a remote day with TT, not in-office.  Bertram Millard, RN to Lars Mage, RN      08/13/23  6:18 PM HI.[name] I saw that you are covering TT tomorrow and the papers are in her mailbox. Thank you!    08/13/23  2:21 PM Verdis Prime L routed this  conversation to Bertram Millard, RN  Sylvan Cheese  AS   08/13/23  2:20 PM Note Paper Work Dropped Off:  Patient Assistance Paperwork       Date: 03/192025   Location of paper:  Dr. Norris Cross Box

## 2023-08-21 NOTE — Telephone Encounter (Signed)
   I called the patient and he said he sent in everything on 08/19/23, he mailed it. They probably still havent received his portion yet.

## 2023-08-22 LAB — HM DIABETES EYE EXAM

## 2023-09-02 ENCOUNTER — Encounter: Payer: Self-pay | Admitting: Pharmacy Technician

## 2023-09-02 NOTE — Telephone Encounter (Signed)
 I called [employer] to check on application and they said they still have not received his portion. I sent the patient a message.

## 2023-09-09 ENCOUNTER — Encounter: Payer: Self-pay | Admitting: Pharmacy Technician

## 2023-09-09 ENCOUNTER — Telehealth: Payer: Self-pay

## 2023-09-09 NOTE — Telephone Encounter (Signed)
 FYI     Copied from CRM [id number]. Topic: Appointments - Appointment Scheduling >> Sep 08, 2023  3:12 PM Allyne Areola wrote: Patient/patient representative is calling to schedule an appointment. Refer to attachments for appointment information. Patient is calling to schedule an appointment with Dr.Duncan regarding some concerns he has with metFORMIN (GLUCOPHAGE) 500 MG tablet, experiencing some side effects and feels like the medication isn't working. Offered to transfer to a triage nurse but he declined and stated he needs Dr.Duncan. Scheduled the appointment for next available on 09/15/2023 and added patient to cancellation list.

## 2023-09-09 NOTE — Telephone Encounter (Signed)
 I called and they said he is approved today until 09/08/24. They have submitted the initial fill to the pharmacy. No tracking number yet. They said it should be submitted next few days for delivery. I sent the pt a message

## 2023-09-14 NOTE — Telephone Encounter (Signed)
Noted.  Thanks.  Will see at office visit. 

## 2023-09-15 ENCOUNTER — Encounter: Payer: Self-pay | Admitting: Family Medicine

## 2023-09-15 ENCOUNTER — Ambulatory Visit (INDEPENDENT_AMBULATORY_CARE_PROVIDER_SITE_OTHER): Payer: Self-pay | Admitting: Family Medicine

## 2023-09-15 VITALS — BP 112/68 | HR 65 | Temp 97.7°F | Ht 72.0 in | Wt 311.0 lb

## 2023-09-15 DIAGNOSIS — Z7985 Long-term (current) use of injectable non-insulin antidiabetic drugs: Secondary | ICD-10-CM

## 2023-09-15 DIAGNOSIS — R911 Solitary pulmonary nodule: Secondary | ICD-10-CM

## 2023-09-15 DIAGNOSIS — E119 Type 2 diabetes mellitus without complications: Secondary | ICD-10-CM

## 2023-09-15 DIAGNOSIS — R21 Rash and other nonspecific skin eruption: Secondary | ICD-10-CM

## 2023-09-15 MED ORDER — CEPHALEXIN 500 MG PO CAPS: 500.0000 mg | ORAL_CAPSULE | Freq: Three times a day (TID) | ORAL | 0 refills | Status: DC

## 2023-09-15 MED ORDER — TRIAMCINOLONE ACETONIDE 0.5 % EX CREA: 1.0000 | TOPICAL_CREAM | Freq: Two times a day (BID) | CUTANEOUS | 1 refills | Status: DC | PRN

## 2023-09-15 MED ORDER — OZEMPIC (0.25 OR 0.5 MG/DOSE) 2 MG/3ML ~~LOC~~ SOPN: PEN_INJECTOR | SUBCUTANEOUS | 2 refills | Status: DC

## 2023-09-15 NOTE — Patient Instructions (Addendum)
 You should get a call about the lung CT.   Stop metformin  in the meantime.   Let me know if you can't get ozempic  filled.  Recheck in about 3 months.   Take care.  Glad to see you.   Let me know if keflex  and TAC cream don't help.

## 2023-09-15 NOTE — Progress Notes (Unsigned)
 D/w pt about getting f/u CT done for pulmonary nodule.  Ordered.    Previous CT with new small solid pulmonary nodule of the left upper lobe measuring 6 mm. Non-contrast chest CT at 6-12 months is recommended. If the nodule is stable at time of repeat CT, then future CT at 18-24 months (from today's scan) is considered optional for low-risk patients, but is recommended for high-risk patients.   He has glaucoma procedure pending for the summer.  Then he is going to have dental implants this summer.  Discussed.  DM2 d/w pt.  Last A1c 8.  He had GI upset with metformin .  D/w pt about weight loss goals.  D/w pt about ozempic  with routine cautions.  Rx sent.   Rash, R>L shin rash.  It improved when he was taking amoxil  for dental issue.  Blanches.  No pain but itches.    Meds, vitals, and allergies reviewed.   ROS: Per HPI unless specifically indicated in ROS section   Nad Ncat Neck supple no LA Rrr Ctab Abd soft, not ttp Blanching rash on the R>L shin.  No ulceration.  Not dermatomal.

## 2023-09-17 NOTE — Assessment & Plan Note (Signed)
 It would be atypical to have bilateral cellulitis, but he did improve on Amoxil .  Discussed options. He can let me know if keflex  and TAC cream don't help.

## 2023-09-17 NOTE — Assessment & Plan Note (Signed)
 CT ordered.

## 2023-09-17 NOTE — Assessment & Plan Note (Signed)
 Discussed options.  Stop metformin  in the meantime.   He can let me know if he can't get ozempic  filled.  Recheck in about 3 months.

## 2023-10-02 ENCOUNTER — Other Ambulatory Visit: Payer: Self-pay | Admitting: Family Medicine

## 2023-10-03 MED ORDER — CEPHALEXIN 500 MG PO CAPS: 500.0000 mg | ORAL_CAPSULE | Freq: Three times a day (TID) | ORAL | 0 refills | Status: DC

## 2023-10-10 ENCOUNTER — Ambulatory Visit: Payer: Self-pay | Admitting: Pharmacist

## 2023-10-14 ENCOUNTER — Ambulatory Visit: Payer: Self-pay | Admitting: Podiatry

## 2023-10-21 ENCOUNTER — Inpatient Hospital Stay
Admission: RE | Admit: 2023-10-21 | Discharge: 2023-10-21 | Disposition: A | Discharge status: Home / Self Care | Payer: Self-pay | Source: Ambulatory Visit | Attending: Family Medicine | Admitting: Family Medicine

## 2023-10-23 ENCOUNTER — Inpatient Hospital Stay: Admission: RE | Admit: 2023-10-23 | Payer: Self-pay | Source: Ambulatory Visit

## 2023-10-29 ENCOUNTER — Emergency Department
Admission: EM | Admit: 2023-10-29 | Discharge: 2023-10-29 | Disposition: A | Discharge status: Home / Self Care | Payer: Self-pay | Attending: Emergency Medicine | Admitting: Emergency Medicine

## 2023-10-29 DIAGNOSIS — Z5321 Procedure and treatment not carried out due to patient leaving prior to being seen by health care provider: Secondary | ICD-10-CM | POA: Insufficient documentation

## 2023-10-29 DIAGNOSIS — T465X1A Poisoning by other antihypertensive drugs, accidental (unintentional), initial encounter: Secondary | ICD-10-CM | POA: Insufficient documentation

## 2023-10-29 DIAGNOSIS — X58XXXA Exposure to other specified factors, initial encounter: Secondary | ICD-10-CM | POA: Insufficient documentation

## 2023-10-29 NOTE — ED Notes (Signed)
 Per poision control no monitoring needed at this time, pt safe to take medications again in the morning.

## 2023-10-29 NOTE — ED Triage Notes (Signed)
 Pt reports taking an extra dose of medications tonight, pt took:  x2   97-103 entresto  X1   25mg  Spironolactone   X1 6.25mg   Carvedilol 

## 2023-10-30 ENCOUNTER — Inpatient Hospital Stay: Admission: RE | Admit: 2023-10-30 | Source: Ambulatory Visit

## 2023-11-05 ENCOUNTER — Ambulatory Visit
Admission: RE | Admit: 2023-11-05 | Discharge: 2023-11-05 | Disposition: A | Discharge status: Home / Self Care | Payer: Self-pay | Source: Ambulatory Visit | Attending: Family Medicine | Admitting: Family Medicine

## 2023-11-05 DIAGNOSIS — R911 Solitary pulmonary nodule: Secondary | ICD-10-CM

## 2023-11-13 ENCOUNTER — Ambulatory Visit: Payer: Self-pay | Admitting: Family Medicine

## 2023-11-19 ENCOUNTER — Ambulatory Visit: Payer: Self-pay

## 2023-11-19 ENCOUNTER — Other Ambulatory Visit: Payer: Self-pay | Admitting: Family Medicine

## 2023-11-19 NOTE — Telephone Encounter (Signed)
 Patient has been scheduled

## 2023-11-19 NOTE — Telephone Encounter (Signed)
 See refill note.  Patient has been scheduled in the meantime.

## 2023-11-19 NOTE — Telephone Encounter (Signed)
 FYI Only or Action Required?: Action required by provider: medication refill request and update on patient condition.  Patient was last seen in primary care on 09/15/2023 by Cleatus Arlyss RAMAN, MD. Called Nurse Triage reporting Wound Infection. Symptoms began yesterday. Interventions attempted: Nothing. Symptoms are: bilateral shins with pinpoint red dots and itchy improved and then returned yesterday.  Triage Disposition: Call PCP Now  Patient/caregiver understands and will follow disposition?: Yes                           Answer Assessment - Initial Assessment Questions Patient states his leg wound infections have come back after using the cream and antibiotics. Patient states he is travelling this weekend and would not be able to come in for an appointment til later next week, he is asking for refills on the medications before his trip.  1. LOCATION: Where is the wound located?      Both legs on the shin.  2. WOUND APPEARANCE: What does the wound look like?      Redness, with little red spots on the shins. Very itchy.  3. SIZE: If redness is present, ask: What is the size of the red area? (Inches, centimeters, or compare to size of a coin)      Slightly larger than pinpoint.  4. SPREAD: What's changed in the last day?  Do you see any red streaks coming from the wound?     About the same as the past few days.  5. ONSET: When did it start to look infected?      Tuesday it worsened.  6. MECHANISM: How did the wound start, what was the cause?     3-4 months ago, patient was started on Kenalog  cream and cephalexin . He states it helped 100% but it was just about gone and it started to come back on Tuesday.  7. PAIN: Is there any pain? If Yes, ask: How bad is the pain?   (Scale 1-10; or mild, moderate, severe)     No.  8. FEVER: Do you have a fever? If Yes, ask: What is your temperature, how was it measured, and when did it start?      No.  9. OTHER SYMPTOMS: Do you have any other symptoms? (e.g., shaking chills, weakness, rash elsewhere on body)     No chills, weakness, rash anywhere else on body.  10. PREGNANCY: Is there any chance you are pregnant? When was your last menstrual period?       N/A.  Protocols used: Wound Infection-A-AH

## 2023-11-19 NOTE — Telephone Encounter (Signed)
 Copied from CRM [id number]. Topic: Clinical - Medication Refill >> Nov 19, 2023  2:44 PM Rosina BIRCH wrote: Medication: cephALEXin  (KEFLEX ) 500 MG capsule  Has the patient contacted their pharmacy? No (Agent: If no, request that the patient contact the pharmacy for the refill. If patient does not wish to contact the pharmacy document the reason why and proceed with request.) (Agent: If yes, when and what did the pharmacy advise?)  This is the patient's preferred pharmacy:  [id number] [place], [place] - [address] [place] [address] Phone: [phone] Fax: [phone]  Is this the correct pharmacy for this prescription? Yes If no, delete pharmacy and type the correct one.   Has the prescription been filled recently? No  Is the patient out of the medication? Yes  Has the patient been seen for an appointment in the last year OR does the patient have an upcoming appointment? Yes  Can we respond through MyChart? Yes  Agent: Please be advised that Rx refills may take up to 3 business days. We ask that you follow-up with your pharmacy.

## 2023-11-19 NOTE — Telephone Encounter (Signed)
 I am not in the office.  I think it makes sense to get rechecked before restarting the med.  Please offer same day visit tomorrow if possible vs UC.  Thanks.

## 2023-11-20 ENCOUNTER — Ambulatory Visit: Payer: Self-pay | Admitting: Family Medicine

## 2023-11-20 ENCOUNTER — Encounter: Payer: Self-pay | Admitting: Family Medicine

## 2023-11-20 VITALS — BP 142/88 | HR 73 | Temp 98.5°F | Ht 72.0 in | Wt 309.6 lb

## 2023-11-20 DIAGNOSIS — Z7984 Long term (current) use of oral hypoglycemic drugs: Secondary | ICD-10-CM

## 2023-11-20 DIAGNOSIS — R911 Solitary pulmonary nodule: Secondary | ICD-10-CM

## 2023-11-20 DIAGNOSIS — E119 Type 2 diabetes mellitus without complications: Secondary | ICD-10-CM

## 2023-11-20 DIAGNOSIS — L0390 Cellulitis, unspecified: Secondary | ICD-10-CM

## 2023-11-20 DIAGNOSIS — M109 Gout, unspecified: Secondary | ICD-10-CM

## 2023-11-20 LAB — POCT GLYCOSYLATED HEMOGLOBIN (HGB A1C): Hemoglobin A1C: 6.9 % — AB (ref 4.0–5.6)

## 2023-11-20 MED ORDER — COLCHICINE 0.6 MG PO TABS: 0.3000 mg | ORAL_TABLET | Freq: Every day | ORAL | 0 refills | Status: DC | PRN

## 2023-11-20 MED ORDER — COLCHICINE 0.6 MG PO TABS: 0.3000 mg | ORAL_TABLET | Freq: Every day | ORAL | Status: DC | PRN

## 2023-11-20 MED ORDER — HYDROCODONE-ACETAMINOPHEN 5-325 MG PO TABS: 1.0000 | ORAL_TABLET | Freq: Four times a day (QID) | ORAL | 0 refills | Status: DC | PRN

## 2023-11-20 MED ORDER — CEPHALEXIN 500 MG PO CAPS: 500.0000 mg | ORAL_CAPSULE | Freq: Three times a day (TID) | ORAL | 0 refills | Status: DC

## 2023-11-20 MED ORDER — METFORMIN HCL 500 MG PO TABS: 500.0000 mg | ORAL_TABLET | Freq: Every day | ORAL | Status: DC

## 2023-11-20 MED ORDER — TRIAMCINOLONE ACETONIDE 0.5 % EX CREA: 1.0000 | TOPICAL_CREAM | Freq: Two times a day (BID) | CUTANEOUS | 2 refills | Status: AC | PRN

## 2023-11-20 MED ORDER — PREDNISONE 10 MG PO TABS: ORAL_TABLET | ORAL | 1 refills | Status: DC

## 2023-11-20 NOTE — Progress Notes (Signed)
 R>L lower leg sx.  Itchy, pinkish changes superficially on the skin.  No clear trigger.  No pain.  No fevers.  Rash started about 5 days ago.  He is already using TAC without resolution.  Episode is similar to prior, previously responded to antibiotics.  Travelling to [place] soon.    A1c 8 in 06/2023.  On metformin  500mg  daily.  Not yet on ozempic .  A1c improved to 6.9, d/w pt at OV.    D/w pt about colchicine  use at lower dose prn, with coreg .  No sig renal or hepatic impairment.    Had used prednisone  and hydrocodone  with prev gout flare.  Discussed routine cautions, refill.  Previous CT discussed with patient. CT shows previously known coronary artery calcifications. The plan for that would be to continue his current medications. The previously seen lung nodule is not visible.  No lung nodule follow-up needed.  Meds, vitals, and allergies reviewed.   ROS: Per HPI unless specifically indicated in ROS section   Nad Ncat Neck supple, no [place] Rrr Ctab Abd soft. Normal BS L1st toe with likely chronic gouty changes.  No ulceration. Blanching maculopapular rash on the R>L anterior shins w/o ulceration.

## 2023-11-20 NOTE — Patient Instructions (Addendum)
 Restart keflex .  Update me as needed.  Take care.  Glad to see you. Recheck at a visit in about 4 months.  Labs at the visit.

## 2023-11-23 DIAGNOSIS — L0390 Cellulitis, unspecified: Secondary | ICD-10-CM | POA: Insufficient documentation

## 2023-11-23 NOTE — Assessment & Plan Note (Signed)
 CT shows previously known coronary artery calcifications. The plan for that would be to continue his current medications. The previously seen lung nodule is not visible.  No lung nodule follow-up needed.

## 2023-11-23 NOTE — Assessment & Plan Note (Signed)
 A1c 8 in 06/2023.  On metformin  500mg  daily.  Not yet on ozempic .  A1c improved to 6.9, d/w pt at OV. Recheck at a visit in about 4 months.  Labs at the visit.  No change in meds at this point.

## 2023-11-23 NOTE — Assessment & Plan Note (Signed)
 Restart keflex .  Update me as needed.  Okay for outpatient follow-up.  He agrees to plan.

## 2023-11-23 NOTE — Assessment & Plan Note (Signed)
 D/w pt about colchicine  use at lower dose prn, with coreg .  No sig renal or hepatic impairment.    Had used prednisone  and hydrocodone  with prev gout flare.  Discussed routine cautions, refill.

## 2023-12-01 ENCOUNTER — Ambulatory Visit: Payer: Self-pay | Attending: Pharmacist | Admitting: Pharmacist

## 2023-12-01 NOTE — Progress Notes (Deleted)
 Patient ID: Colton [name].                 DOB: May 26, 1956                    MRN: [id number]      HPI: Colton [name]. is a 68 y.o. male patient referred to lipid clinic by Dr.Turner. PMH is significant for hypertension, CHF, CAD, T2DM,hx of CVA 10/2015 treated with IV TPA  hx of thrombocytopenia, HLD, gout, ED   Recent lipid lab reveled elevated TG level - pt reports he had gout flares from [place] .  Reviewed options for lowering LDL cholesterol, including ezetimibe, PCSK-9 inhibitors, bempedoic acid and inclisiran.  Discussed mechanisms of action, dosing, side effects and potential decreases in LDL cholesterol.  Also reviewed cost information and potential options for patient assistance.  Current Medications: Lipitor 20 mg daily and Lovaza  2 gm twice daily  Intolerances:  Risk Factors:  hypertension, CAD, T2DM,hx of CVA 10/2015 treated with IV TPA LDL goal: <55 and TG <150 mg/dl  Last Lab: (96/74) TC 888, TG 214, HDL 26.40, LDL 42   Diet:   Exercise:   Family History:  Relation Problem Comments  Mother (Deceased)   Father (Deceased) CAD   Heart disease     Sister Metallurgist)   Brother Metallurgist)   Brother Metallurgist)   Brother (Alive)   Maternal Grandmother (Deceased)   Maternal Grandfather (Deceased)   Paternal Grandmother (Deceased)   Paternal Actor (Deceased)   Other - Animator (Alive) CAD   Heart disease     Other - Paternal side (Alive) Lung cancer     Social History:   Labs:  Lipid Panel     Component Value Date/Time   CHOL 111 08/11/2023 1325   CHOL 126 11/22/2022 0720   TRIG 214.0 (H) 08/11/2023 1325   HDL 26.40 (L) 08/11/2023 1325   HDL 31 (L) 11/22/2022 0720   CHOLHDL 4 08/11/2023 1325   VLDL 42.8 (H) 08/11/2023 1325   LDLCALC 42 08/11/2023 1325   LDLCALC 68 11/22/2022 0720   LDLDIRECT 59.0 08/27/2021 0802   LABVLDL 27 11/22/2022 0720    Past Medical History:  Diagnosis Date   Coronary artery disease    cath 01/2016 with 60% LAD with FFR 0.82  and 80% mid LCX s/p PCI   Diabetes mellitus without complication (HCC)    Dilated cardiomyopathy (HCC)    EF 35-40% by [id number] and 42% by cardiac MR.  EF normalized on echo 02/2020   Discoid lupus    Fatty liver    Former smoker    Gout 07/2001   [facility]   History of gout    Hyperlipidemia LDL goal <70    Hypertension    Rosacea    Stroke [facility])    s/p tpa 2017 w/o residual defecit    Current Outpatient Medications on File Prior to Visit  Medication Sig Dispense Refill   aspirin  81 MG chewable tablet Chew 1 tablet (81 mg total) by mouth daily. 90 tablet 3   atorvastatin  (LIPITOR) 20 MG tablet Take 1 tablet (20 mg total) by mouth daily. 90 tablet 3   carvedilol  (COREG ) 6.25 MG tablet Take 1 tablet (6.25 mg total) by mouth 2 (two) times daily. 180 tablet 3   cephALEXin  (KEFLEX ) 500 MG capsule Take 1 capsule (500 mg total) by mouth 3 (three) times daily. 21 capsule 0   clopidogrel  (PLAVIX ) 75 MG tablet Take 1 tablet (75 mg total) by  mouth daily. 90 tablet 3   colchicine  0.6 MG tablet Take 0.5 tablets (0.3 mg total) by mouth daily as needed (for gout). 30 tablet 0   fish oil -omega-3 fatty acids  1000 MG capsule Take 2 capsules (2 g total) by mouth daily. 183 capsule 3   HYDROcodone -acetaminophen  (NORCO/VICODIN) 5-325 MG tablet Take 1 tablet by mouth every 6 (six) hours as needed for severe pain (pain score 7-10) (for gout). 10 tablet 0   metFORMIN  (GLUCOPHAGE ) 500 MG tablet Take 1 tablet (500 mg total) by mouth daily.     nitroGLYCERIN  (NITROSTAT ) 0.4 MG SL tablet Place 1 tablet (0.4 mg total) under the tongue every 5 (five) minutes as needed for chest pain. 25 tablet 2   omega-3 acid ethyl esters (LOVAZA ) 1 g capsule TAKE 2 CAPSULES BY MOUTH EVERY MORNING AND TAKE TWO CAPSULES BY MOUTH EVERY EVENING *KEEP UPCOMING APPOINTMENT FOR FURTHER REFILLS* 360 capsule 3   predniSONE  (DELTASONE ) 10 MG tablet Take 2 a day for 5 days, then 1 a day for 5 days, with food. Don't take with  aleve/ibuprofen.  Use only if colchicine  doesn't help with gout. 15 tablet 1   sacubitril -valsartan  (ENTRESTO ) 97-103 MG Take 1 tablet by mouth 2 (two) times daily. 180 tablet 3   Semaglutide ,0.25 or 0.5MG /DOS, (OZEMPIC , 0.25 OR 0.5 MG/DOSE,) 2 MG/3ML SOPN Inject 0.25 mg once weekly for 4 weeks, then increase to 0.5 mg once weekly if tolerated. (Patient not taking: Reported on 11/20/2023) 3 mL 2   spironolactone  (ALDACTONE ) 25 MG tablet TAKE 1/2 TABLET BY MOUTH DAILY. 90 tablet 3   Tetrahydrozoline HCl (VISINE OP) Place 1 drop into both eyes as needed (for dry eyes).     triamcinolone  cream (KENALOG ) 0.5 % Apply 1 Application topically 2 (two) times daily as needed. 30 g 2   Vitamin D , Cholecalciferol , 25 MCG (1000 UT) CAPS Take 1,000 Units by mouth daily.     No current facility-administered medications on file prior to visit.    Allergies  Allergen Reactions   Iodine Other (See Comments)    Gout    Metformin  And Related     Max tolerated dose 500mg  per day.    Latex Rash    Assessment/Plan:  1. Hyperlipidemia -  No problems updated. No problem-specific Assessment & Plan notes found for this encounter.    Thank you,  Robbi Blanch, Pharm.D [place] [facility] BIRCH. [facility] [address], [place], [place] [address] Phone: [phone]; Fax: [phone]

## 2023-12-05 ENCOUNTER — Ambulatory Visit: Payer: Self-pay

## 2023-12-05 ENCOUNTER — Other Ambulatory Visit: Payer: Self-pay | Admitting: Family Medicine

## 2023-12-05 MED ORDER — DOXYCYCLINE HYCLATE 100 MG PO TABS: 100.0000 mg | ORAL_TABLET | Freq: Two times a day (BID) | ORAL | 0 refills | Status: DC

## 2023-12-05 NOTE — Telephone Encounter (Signed)
 FYI Only or Action Required?: Action required by provider: No PCP availability, refusing UC in meantime, requesting call back with appt options/next steps for worsening symptoms after treatment.  Patient was last seen in primary care on 11/20/2023 by Colton Arlyss RAMAN, MD.  Called Nurse Triage reporting Rash.  Symptoms began several weeks ago.  Interventions attempted: Prescription medications: antibx and prescribed cream.  Symptoms are: rapidly worsening.  Triage Disposition: See Physician Within 24 Hours (overriding See PCP When Office is Open (Within 3 Days))  Patient/caregiver understands and will follow disposition?: No, wishes to speak with PCP      Copied from CRM [id number]. Topic: Clinical - Red Word Triage >> Dec 05, 2023  2:24 PM Montie POUR wrote: Red Word that prompted transfer to Nurse Triage:  right lower leg rash -   Itchy, pinkish changes superficially on the skin. He has pain at a 2 or 3. This has gotten worse. He came in on 11/19/24 for the rash Reason for Disposition  Localized rash present > 7 days  Answer Assessment - Initial Assessment Questions 1. APPEARANCE of RASH: What does the rash look like? (e.g., blisters, dry flaky skin, red spots, redness, sores)     Spreading 20% more on the leg Pinkish Not changed appearance Several different spots Gave cream and antibx nothing working 2. LOCATION: Where is the rash located?      Right lower leg 4. SIZE: How big are the spots? (e.g., inches, cm; or compare to size of pinhead, tip of pen, eraser, pea)      Gotten bigger, not sure what size, big enough to be a problem, bigger than a pea definitely 6. ITCHING: Does the rash itch? If Yes, ask: How bad is the itch?  (Scale 0-10; or none, mild, moderate, severe)     7/10 7. PAIN: Does the rash hurt? If Yes, ask: How bad is the pain?  (Scale 0-10; or none, mild, moderate, severe)     2-3/10, no pain meds 8. OTHER SYMPTOMS: Do you have any other symptoms?  (e.g., fever)     No fever or other new symptoms   I know he'll do this for me, knows me, knows my medical hx, fit me in, hope for call back today Would rather see PCP than start over with another doc for this   Advised pt be examined in next 24 hours for worsening rash despite antibx and cream, pt refusing UC and appt with provider other than PCP at this time, no availability with Dr. Cleatus until late July, sending message to office for call back to pt with earlier appt options/further recommendations per pt request, advised UC especially if any worsening  Protocols used: Rash or Redness - Localized-A-AH

## 2023-12-05 NOTE — Telephone Encounter (Signed)
Noted. Thanks.  Appreciate the help of all involved. °

## 2023-12-05 NOTE — Telephone Encounter (Signed)
 I spoke with pt; pt was seen 11/20/23 and was restarted on keflex  500 mg taking tid; pt first said was taking abx one daily but pt changed that and said he was thinking of different med and that pt finished taking the keflex  500 mg tid one day last wk (pt could not remember exact day finished abx.) pt said there was no improvement and now the cellulitis is worsening. Pt said red area is slightly larger below knee at shin area, some swelling, slight pain but no fever.pt does not want to go to UC or ED. Pt requesting stronger abx. Dr Cleatus is out of office. I spoke with Dr KANDICE; Dr KANDICE will send in doxycycline  100 mg #14 taking bid. Cautioned pt about sunburn precautions while taking the doxycycline  and pt said he would stay out of sun. Pt scheduled appt to see Dr Cleatus on 12/08/23 at 10 AM with UC & ED precautions and pt voiced understanding and appreciative that Dr KANDICE sent in abx and for appt on 12/08/23 with Dr Cleatus.. sending note to Dr Cleatus as PCP and Dr KANDICE.

## 2023-12-08 ENCOUNTER — Ambulatory Visit (INDEPENDENT_AMBULATORY_CARE_PROVIDER_SITE_OTHER): Payer: Self-pay | Admitting: Family Medicine

## 2023-12-08 ENCOUNTER — Encounter: Payer: Self-pay | Admitting: Family Medicine

## 2023-12-08 ENCOUNTER — Encounter: Payer: Self-pay | Admitting: Cardiology

## 2023-12-08 VITALS — BP 154/82 | HR 70 | Temp 98.2°F | Ht 72.0 in | Wt 311.2 lb

## 2023-12-08 DIAGNOSIS — L0390 Cellulitis, unspecified: Secondary | ICD-10-CM

## 2023-12-08 MED ORDER — PREDNISONE 10 MG PO TABS: ORAL_TABLET | ORAL | 1 refills | Status: DC

## 2023-12-08 MED ORDER — PREDNISONE 10 MG PO TABS: ORAL_TABLET | ORAL | Status: DC

## 2023-12-08 NOTE — Patient Instructions (Signed)
 Use prednisone  in the meantime.  Update me in a few days, ie this week.  If clearly better, then stop doxy and take prednisone  until resolved.  Update me if needed and we can refer to dermatology.  Take care.  Glad to see you.

## 2023-12-08 NOTE — Progress Notes (Unsigned)
 Cellulitis follow up.  On doxy.  Not on prednisone . Done with keflex .   R>L shin with anterior changes.  L leg nearly back to normal.  No pain.  No fevers.  No vomiting, no diarrhea.   Rash prev improved on prednisone , in distant past.    He had responded to amoxil  in the past, with prednisone .     Blanching irritated lesions w/o ulceration on the R >>L shin.       3mm benign appearing brown papule on the L temple area.  No ulceration. Could be a small SK, d/w pt.

## 2023-12-10 NOTE — Assessment & Plan Note (Signed)
 This could be inflammatory, not just infectious.  Discussed.  Offered dermatology evaluation.  Discussed. He can use prednisone  in the meantime.  Steroid cautions discussed. Update me in a few days, ie this week.  If clearly better, then stop doxy and take prednisone  until resolved.  Update me if needed and we can refer to dermatology.  He agrees with plan.

## 2023-12-24 ENCOUNTER — Ambulatory Visit: Payer: Self-pay | Admitting: Pharmacist Clinician (PhC)/ Clinical Pharmacy Specialist

## 2023-12-24 NOTE — Progress Notes (Deleted)
 Office Visit    Patient Name: Colton [name]. Date of Encounter: 12/24/2023  Primary Care Provider:  Cleatus Arlyss RAMAN, MD Primary Cardiologist:  Wilbert Bihari, MD  Chief Complaint    Hyperlipidemia   Significant Past Medical History   CVA 2017 tx w/tPA no residual deficit  HFrEF 9/24 EF 55-60%, up from 35-40% in 2017; on Entrestso, spiro, carvedilol   CAD 2017 cath 80% mCFX s/p DES; 60% LAD  DM2 6/25 A1c 6.9, on metformin , Ozempic   gout On prn colchicine , prednisone   obesity BMI 42.67, causing chronic SOB     Allergies  Allergen Reactions   Iodine Other (See Comments)    Gout    Metformin  And Related     Max tolerated dose 500mg  per day.    Latex Rash    History of Present Illness    Colton [name]. is a 68 y.o. male patient of Dr Bihari, in the office today to discuss options for cholesterol management.  Insurance Carrier:   Pharmacy:     Healthwell:      LDL Cholesterol goal:    Current Medications:     Previously tried:    Family Hx:     Social Hx: Tobacco: Alcohol:      Diet:      Exercise:   Adherence Assessment  Do you ever forget to take your medication? [] Yes [] No  Do you ever skip doses due to side effects? [] Yes [] No  Do you have trouble affording your medicines? [] Yes [] No  Are you ever unable to pick up your medication due to transportation difficulties? [] Yes [] No  Do you ever stop taking your medications because you don't believe they are helping? [] Yes [] No  Do you check your weight daily? [] Yes [] No   Adherence strategy: ***  Barriers to obtaining medications: ***     Accessory Clinical Findings   Lab Results  Component Value Date   CHOL 111 08/11/2023   HDL 26.40 (L) 08/11/2023   LDLCALC 42 08/11/2023   LDLDIRECT 59.0 08/27/2021   TRIG 214.0 (H) 08/11/2023   CHOLHDL 4 08/11/2023    No results found for: LIPOA  Lab Results  Component Value Date   ALT 23 08/11/2023   AST 18 08/11/2023   ALKPHOS 61  08/11/2023   BILITOT 0.8 08/11/2023   Lab Results  Component Value Date   CREATININE 1.23 08/11/2023   BUN 19 08/11/2023   NA 136 08/11/2023   K 4.6 08/11/2023   CL 103 08/11/2023   CO2 25 08/11/2023   Lab Results  Component Value Date   HGBA1C 6.9 (A) 11/20/2023    Home Medications    Current Outpatient Medications  Medication Sig Dispense Refill   aspirin  81 MG chewable tablet Chew 1 tablet (81 mg total) by mouth daily. 90 tablet 3   atorvastatin  (LIPITOR) 20 MG tablet Take 1 tablet (20 mg total) by mouth daily. 90 tablet 3   carvedilol  (COREG ) 6.25 MG tablet Take 1 tablet (6.25 mg total) by mouth 2 (two) times daily. 180 tablet 3   clopidogrel  (PLAVIX ) 75 MG tablet Take 1 tablet (75 mg total) by mouth daily. 90 tablet 3   colchicine  0.6 MG tablet Take 0.5 tablets (0.3 mg total) by mouth daily as needed (for gout). 30 tablet 0   doxycycline  (VIBRA -TABS) 100 MG tablet Take 1 tablet (100 mg total) by mouth 2 (two) times daily. 14 tablet 0   fish oil -omega-3 fatty acids  1000 MG capsule Take 2 capsules (2  g total) by mouth daily. 183 capsule 3   HYDROcodone -acetaminophen  (NORCO/VICODIN) 5-325 MG tablet Take 1 tablet by mouth every 6 (six) hours as needed for severe pain (pain score 7-10) (for gout). 10 tablet 0   metFORMIN  (GLUCOPHAGE ) 500 MG tablet Take 1 tablet (500 mg total) by mouth daily.     nitroGLYCERIN  (NITROSTAT ) 0.4 MG SL tablet Place 1 tablet (0.4 mg total) under the tongue every 5 (five) minutes as needed for chest pain. 25 tablet 2   omega-3 acid ethyl esters (LOVAZA ) 1 g capsule TAKE 2 CAPSULES BY MOUTH EVERY MORNING AND TAKE TWO CAPSULES BY MOUTH EVERY EVENING *KEEP UPCOMING APPOINTMENT FOR FURTHER REFILLS* 360 capsule 3   predniSONE  (DELTASONE ) 10 MG tablet Take 10mg  daily for 1 week for rash.  Don't take with aleve/ibuprofen. 7 tablet 1   predniSONE  (DELTASONE ) 10 MG tablet Take 2 a day for 5 days, then 1 a day for 5 days, with food. Don't take with aleve/ibuprofen.   Use only if colchicine  doesn't help with gout.     sacubitril -valsartan  (ENTRESTO ) 97-103 MG Take 1 tablet by mouth 2 (two) times daily. 180 tablet 3   Semaglutide ,0.25 or 0.5MG /DOS, (OZEMPIC , 0.25 OR 0.5 MG/DOSE,) 2 MG/3ML SOPN Inject 0.25 mg once weekly for 4 weeks, then increase to 0.5 mg once weekly if tolerated. (Patient not taking: Reported on 12/08/2023) 3 mL 2   spironolactone  (ALDACTONE ) 25 MG tablet TAKE 1/2 TABLET BY MOUTH DAILY. 90 tablet 3   Tetrahydrozoline HCl (VISINE OP) Place 1 drop into both eyes as needed (for dry eyes).     triamcinolone  cream (KENALOG ) 0.5 % Apply 1 Application topically 2 (two) times daily as needed. 30 g 2   Vitamin D , Cholecalciferol , 25 MCG (1000 UT) CAPS Take 1,000 Units by mouth daily.     No current facility-administered medications for this visit.     Assessment & Plan    No problem-specific Assessment & Plan notes found for this encounter.   Ka Flammer, PharmD CPP [facility] [address]   [place], [place] [address] [phone]  12/24/2023, 6:43 AM

## 2024-01-29 NOTE — Progress Notes (Unsigned)
 Office Visit    Patient Name: Colton [name]. Date of Encounter: 01/30/2024  Primary Care Provider:  Cleatus Arlyss RAMAN, MD Primary Cardiologist:  Wilbert Bihari, MD  Chief Complaint    Hyperlipidemia   Significant Past Medical History   CVA 2017 treated with TPA  CAD 2017 cath - 80% mCFX s/p DES  CHF 2017 Echo - EF at 35-40%, now 55-60% on 3/4 GDMT (no SGLT2)  gout Had flare after trial of fish oil   DM2 6/25 A1c 6.9 (improved)     Allergies  Allergen Reactions   Iodine Other (See Comments)    Gout    Metformin  And Related     Max tolerated dose 500mg  per day.    Latex Rash    History of Present Illness    Colton [name]. is a 68 y.o. male patient of Dr Bihari, in the office today to discuss options for cholesterol management.  He has a family history of elevated triglycerides, and his have been into the 400's in the past.  Most recently in March were at 214.  Had tried fish oil  at that time, but had to discontinue 2/2 gout flare.  States currently doing fine with all medications   Triglyceride goal:  < 150  Current Medications: none  Previously tried:  fish oils - gout; chart lists fenofibrate , but no indication ever picked up at pharmacy  Family Hx: father died from MI at 18, had elevated triglycerides; mother deceased at 65, no heart disease; siblings healthy, no children  Social Hx: Tobacco: no Alcohol: only on occasion  Diet:    mostly eats out, works long hours; only occasional fast food, maybe 5%; tries to make healthy choices - salad, chicken, wraps, avoids fried foods; no snacking; toast for breakfast (raisin toast), no soda or sweet tea; only unsweet tea and water  Exercise: walks 15-20 minutes few times per week   Accessory Clinical Findings   Lab Results  Component Value Date   CHOL 111 08/11/2023   HDL 26.40 (L) 08/11/2023   LDLCALC 42 08/11/2023   LDLDIRECT 59.0 08/27/2021   TRIG 214.0 (H) 08/11/2023   CHOLHDL 4 08/11/2023    No  results found for: LIPOA  Lab Results  Component Value Date   ALT 23 08/11/2023   AST 18 08/11/2023   ALKPHOS 61 08/11/2023   BILITOT 0.8 08/11/2023   Lab Results  Component Value Date   CREATININE 1.23 08/11/2023   BUN 19 08/11/2023   NA 136 08/11/2023   K 4.6 08/11/2023   CL 103 08/11/2023   CO2 25 08/11/2023   Lab Results  Component Value Date   HGBA1C 6.9 (A) 11/20/2023    Home Medications    Current Outpatient Medications  Medication Sig Dispense Refill   aspirin  81 MG chewable tablet Chew 1 tablet (81 mg total) by mouth daily. 90 tablet 3   atorvastatin  (LIPITOR) 20 MG tablet Take 1 tablet (20 mg total) by mouth daily. 90 tablet 3   carvedilol  (COREG ) 6.25 MG tablet Take 1 tablet (6.25 mg total) by mouth 2 (two) times daily. 180 tablet 3   clopidogrel  (PLAVIX ) 75 MG tablet Take 1 tablet (75 mg total) by mouth daily. 90 tablet 3   colchicine  0.6 MG tablet Take 0.5 tablets (0.3 mg total) by mouth daily as needed (for gout). 30 tablet 0   doxycycline  (VIBRA -TABS) 100 MG tablet Take 1 tablet (100 mg total) by mouth 2 (two) times daily. 14 tablet 0  fish oil -omega-3 fatty acids  1000 MG capsule Take 2 capsules (2 g total) by mouth daily. 183 capsule 3   HYDROcodone -acetaminophen  (NORCO/VICODIN) 5-325 MG tablet Take 1 tablet by mouth every 6 (six) hours as needed for severe pain (pain score 7-10) (for gout). 10 tablet 0   metFORMIN  (GLUCOPHAGE ) 500 MG tablet Take 1 tablet (500 mg total) by mouth daily.     nitroGLYCERIN  (NITROSTAT ) 0.4 MG SL tablet Place 1 tablet (0.4 mg total) under the tongue every 5 (five) minutes as needed for chest pain. 25 tablet 2   omega-3 acid ethyl esters (LOVAZA ) 1 g capsule TAKE 2 CAPSULES BY MOUTH EVERY MORNING AND TAKE TWO CAPSULES BY MOUTH EVERY EVENING *KEEP UPCOMING APPOINTMENT FOR FURTHER REFILLS* 360 capsule 3   predniSONE  (DELTASONE ) 10 MG tablet Take 10mg  daily for 1 week for rash.  Don't take with aleve/ibuprofen. 7 tablet 1   predniSONE   (DELTASONE ) 10 MG tablet Take 2 a day for 5 days, then 1 a day for 5 days, with food. Don't take with aleve/ibuprofen.  Use only if colchicine  doesn't help with gout.     sacubitril -valsartan  (ENTRESTO ) 97-103 MG Take 1 tablet by mouth 2 (two) times daily. 180 tablet 3   Semaglutide ,0.25 or 0.5MG /DOS, (OZEMPIC , 0.25 OR 0.5 MG/DOSE,) 2 MG/3ML SOPN Inject 0.25 mg once weekly for 4 weeks, then increase to 0.5 mg once weekly if tolerated. (Patient not taking: Reported on 12/08/2023) 3 mL 2   spironolactone  (ALDACTONE ) 25 MG tablet TAKE 1/2 TABLET BY MOUTH DAILY. 90 tablet 3   Tetrahydrozoline HCl (VISINE OP) Place 1 drop into both eyes as needed (for dry eyes).     triamcinolone  cream (KENALOG ) 0.5 % Apply 1 Application topically 2 (two) times daily as needed. 30 g 2   Vitamin D , Cholecalciferol , 25 MCG (1000 UT) CAPS Take 1,000 Units by mouth daily.     No current facility-administered medications for this visit.     Assessment & Plan    Hyperlipidemia with target LDL less than 70 Assessment: Patient with elevated triglycerides, not at goal of < 150 Most recent TG 214 on 07/2023 Intolerant to fish oils, caused gout flare Currently on atorvastatin  20 mg daily, with last LDL at 42  Plan: Patient hesitant to start medication.  Explained fenofibrate  would not cause gout.  He would prefer to work on dietary changes before adding more medication. Will repeat labs today and review with him via MyChart for final decision.    Marry Kusch, PharmD CPP [facility] [address]   [place], [place] [address] [phone]  01/30/2024, 9:01 AM

## 2024-01-30 ENCOUNTER — Encounter: Payer: Self-pay | Admitting: Pharmacist Clinician (PhC)/ Clinical Pharmacy Specialist

## 2024-01-30 ENCOUNTER — Ambulatory Visit: Payer: Self-pay | Attending: Internal Medicine | Admitting: Pharmacist Clinician (PhC)/ Clinical Pharmacy Specialist

## 2024-01-30 DIAGNOSIS — E785 Hyperlipidemia, unspecified: Secondary | ICD-10-CM

## 2024-01-30 DIAGNOSIS — Z1321 Encounter for screening for nutritional disorder: Secondary | ICD-10-CM

## 2024-01-30 LAB — LIPID PANEL

## 2024-01-30 NOTE — Assessment & Plan Note (Signed)
 Assessment: Patient with elevated triglycerides, not at goal of < 150 Most recent TG 214 on 07/2023 Intolerant to fish oils, caused gout flare Currently on atorvastatin  20 mg daily, with last LDL at 42  Plan: Patient hesitant to start medication.  Explained fenofibrate  would not cause gout.  He would prefer to work on dietary changes before adding more medication. Will repeat labs today and review with him via MyChart for final decision.

## 2024-01-30 NOTE — Patient Instructions (Signed)
 Your Results:             Your most recent labs Goal  Total Cholesterol 111 < 200  Triglycerides 214 < 150  HDL (happy/good cholesterol) 26 > 40  LDL (lousy/bad cholesterol 42 < 70   Check cholesterol labs today and we will review via MyChart.    High Triglycerides Eating Plan Triglycerides are a type of fat in the blood. High levels of triglycerides can increase your risk of heart disease and stroke. If your triglyceride levels are high, choosing the right foods can help lower your triglycerides and keep your heart healthy. Work with your health care provider or a dietitian to develop an eating plan that is right for you. What are tips for following this plan? General guidelines  Lose weight, if you are overweight. For most people, losing 5-10 lb (2-5 kg) helps lower triglyceride levels. A weight-loss plan may include: 30 minutes of exercise at least 5 days a week. Reducing the amount of calories, sugar, and fat you eat. Eat a wide variety of fresh fruits, vegetables, and whole grains. These foods are high in fiber. Eat foods that contain healthy fats, such as fatty fish, nuts, seeds, and olive oil. Avoid foods that are high in added sugar, added salt (sodium), and saturated fat. Avoid low-fiber, refined carbohydrates such as white bread, crackers, noodles, and white rice. Avoid foods with trans fats or partially hydrogenated oils, such as fried foods or stick margarine. If you drink alcohol: Limit how much you have to: 0-1 drink a day for women who are not pregnant. 0-2 drinks a day for men. Your health care provider may recommend that you drink less than these amounts depending on your overall health. Know how much alcohol is in a drink. In the [place] one drink equals one 12 oz bottle of beer (355 mL), one 5 oz glass of wine (148 mL), or one 1 oz glass of hard liquor (44 mL). Reading food labels Check food labels for: The amount of saturated fat. Choose foods with no or very little  saturated fat (less than 2 g). The amount of trans fat. Choose foods with no transfat. The amount of cholesterol. Choose foods that are low in cholesterol. The amount of sodium. Choose foods with less than 140 milligrams (mg) per serving. Shopping Buy dairy products labeled as nonfat (skim) or low-fat (1%). Avoid buying processed or prepackaged foods. These are often high in added sugar, sodium, and fat. Cooking Choose healthy fats when cooking, such as olive oil, avocado oil, or canola oil. Cook foods using lower fat methods, such as baking, broiling, boiling, or grilling. Make your own sauces, dressings, and marinades when possible, instead of buying them. Store-bought sauces, dressings, and marinades are often high in sodium and sugar. Meal planning Eat more home-cooked food and less restaurant, buffet, and fast food. Eat fatty fish at least 2 times each week. Examples of fatty fish include salmon, trout, sardines, mackerel, tuna, and herring. If you eat whole eggs, do not eat more than 4 egg yolks per week. What foods should I eat? Fruits All fresh, canned (in natural juice), or frozen fruits. Vegetables Fresh or frozen vegetables. Low-sodium canned vegetables. Grains Whole wheat or whole grain breads, crackers, cereals, and pasta. Unsweetened oatmeal. Bulgur. Barley. Quinoa. Brown rice. Whole wheat flour tortillas. Meats and other proteins Skinless chicken or [place]. Ground chicken or [place]. Lean cuts of pork, trimmed of fat. Fish and seafood, especially salmon, trout, and herring. Egg whites.  Dried beans, peas, or lentils. Unsalted nuts or seeds. Unsalted canned beans. Natural peanut or almond butter or other nut butters. Dairy Low-fat dairy products. Skim or low-fat (1%) milk. Reduced fat (2%) and low-sodium cheese. Low-fat ricotta cheese. Low-fat cottage cheese. Plain, low-fat yogurt. Fats and oils Tub margarine without trans fats. Light or reduced-fat mayonnaise. Light or  reduced-fat salad dressings. Avocado. Safflower, olive, sunflower, soybean, and canola oils. The items listed above may not be a complete list of recommended foods and beverages. Talk with your dietitian about what dietary choices are best for you. What foods should I avoid? Fruits Sweetened dried fruit. Canned fruit in syrup. Fruit juice. Vegetables Creamed or fried vegetables. Vegetables in a cheese sauce. Grains White bread. White (regular) pasta. White rice. Cornbread. Bagels. Pastries. Crackers that contain trans fat. Meats and other proteins Fatty cuts of meat. Ribs. Chicken wings. [place]. Sausage. Bologna. Salami. Chitterlings. Fatback. Hot dogs. Bratwurst. Packaged lunch meats. Dairy Whole or reduced-fat (2%) milk. Half-and-half. Cream cheese. Full-fat or sweetened yogurt. Full-fat cheese. Nondairy creamers. Whipped toppings. Processed cheese or cheese spreads. Cheese curds. Fats and oils Butter. Stick margarine. Lard. Shortening. Ghee. Bacon fat. Tropical oils, such as coconut, palm kernel, or palm oils. Beverages Alcohol. Sweetened drinks, such as soda, lemonade, fruit drinks, or punches. Sweets and desserts Corn syrup. Sugars. Honey. Molasses. Candy. Jam and jelly. Syrup. Sweetened cereals. Cookies. Pies. Cakes. Donuts. Muffins. Ice cream. Condiments Store-bought sauces, dressings, and marinades that are high in sugar, such as ketchup and barbecue sauce. The items listed above may not be a complete list of foods and beverages you should avoid. Talk with your dietitian about what dietary choices are best for you. Summary High levels of triglycerides can increase the risk of heart disease and stroke. Choosing the right foods can help lower your triglycerides. Eat plenty of fresh fruits, vegetables, and whole grains. Choose low-fat dairy and lean meats. Eat fatty fish at least twice a week. Avoid processed and prepackaged foods with added sugar, sodium, saturated fat, and trans  fat. If you need suggestions or have questions about what types of food are good for you, talk with your health care provider or a dietitian. This information is not intended to replace advice given to you by your health care provider. Make sure you discuss any questions you have with your health care provider. Document Revised: 09/22/2020 Document Reviewed: 09/22/2020 Elsevier Patient Education  2024 [employer]. Thank you for choosing [employer] [employer]

## 2024-01-31 LAB — HEPATIC FUNCTION PANEL
ALT: 31 IU/L (ref 0–44)
AST: 22 IU/L (ref 0–40)
Albumin: 4.5 g/dL (ref 3.9–4.9)
Alkaline Phosphatase: 65 IU/L (ref 44–121)
Bilirubin Total: 0.5 mg/dL (ref 0.0–1.2)
Bilirubin, Direct: 0.2 mg/dL (ref 0.00–0.40)
Total Protein: 6.6 g/dL (ref 6.0–8.5)

## 2024-01-31 LAB — LIPID PANEL
Chol/HDL Ratio: 3.6 ratio (ref 0.0–5.0)
Cholesterol, Total: 118 mg/dL (ref 100–199)
HDL: 33 mg/dL — ABNORMAL LOW (ref >39)
LDL Chol Calc (NIH): 50 mg/dL (ref 0–99)
Triglycerides: 214 mg/dL — ABNORMAL HIGH (ref 0–149)
VLDL Cholesterol Cal: 35 mg/dL (ref 5–40)

## 2024-01-31 LAB — VITAMIN D 25 HYDROXY (VIT D DEFICIENCY, FRACTURES): Vit D, 25-Hydroxy: 30.6 ng/mL (ref 30.0–100.0)

## 2024-02-02 ENCOUNTER — Other Ambulatory Visit (HOSPITAL_COMMUNITY): Payer: Self-pay

## 2024-02-02 ENCOUNTER — Other Ambulatory Visit: Payer: Self-pay | Admitting: Pharmacist Clinician (PhC)/ Clinical Pharmacy Specialist

## 2024-02-02 ENCOUNTER — Ambulatory Visit: Payer: Self-pay | Admitting: Pharmacist Clinician (PhC)/ Clinical Pharmacy Specialist

## 2024-02-02 MED ORDER — ATORVASTATIN CALCIUM 40 MG PO TABS
40.0000 mg | ORAL_TABLET | Freq: Every day | ORAL | 3 refills | Status: DC
  Filled 2024-02-02: qty 90, 90d supply, fill #0

## 2024-02-02 NOTE — Addendum Note (Signed)
 Addended by: Rithik Odea L on: 02/02/2024 03:03 PM   Modules accepted: Orders

## 2024-02-03 ENCOUNTER — Other Ambulatory Visit (HOSPITAL_COMMUNITY): Payer: Self-pay

## 2024-03-17 ENCOUNTER — Other Ambulatory Visit: Payer: Self-pay | Admitting: Family Medicine

## 2024-03-18 NOTE — Telephone Encounter (Signed)
 Pt was supposed to be back in the office for a F/U in 4 months after June OV. Metformin  sent in for 1 month. Needs OV with Dr Cleatus.

## 2024-03-19 NOTE — Telephone Encounter (Signed)
 Copied from CRM [id number]. Topic: Clinical - Prescription Issue >> Mar 18, 2024  4:51 PM Delon T wrote: Reason for CRM: cephALEXin  (KEFLEX ) 500 MG capsule- prescription was denied- unable to schedule appt at this time due to traveling, please call patient [phone]

## 2024-03-22 NOTE — Telephone Encounter (Signed)
 Spoke with pt scheduling 4 mo DM f/u on 03/30/24 at 8:30.

## 2024-03-23 LAB — COLOGUARD

## 2024-03-24 ENCOUNTER — Ambulatory Visit: Payer: Self-pay | Admitting: Family Medicine

## 2024-03-30 ENCOUNTER — Ambulatory Visit: Payer: Self-pay | Admitting: Family Medicine

## 2024-04-08 ENCOUNTER — Other Ambulatory Visit: Payer: Self-pay | Admitting: Family Medicine

## 2024-05-03 ENCOUNTER — Ambulatory Visit (INDEPENDENT_AMBULATORY_CARE_PROVIDER_SITE_OTHER): Payer: Self-pay | Admitting: Family Medicine

## 2024-05-03 VITALS — BP 118/68 | HR 71 | Temp 97.9°F | Ht 70.5 in | Wt 311.1 lb

## 2024-05-03 DIAGNOSIS — Z6841 Body Mass Index (BMI) 40.0 and over, adult: Secondary | ICD-10-CM

## 2024-05-03 DIAGNOSIS — K009 Disorder of tooth development, unspecified: Secondary | ICD-10-CM

## 2024-05-03 DIAGNOSIS — Z1211 Encounter for screening for malignant neoplasm of colon: Secondary | ICD-10-CM

## 2024-05-03 DIAGNOSIS — E119 Type 2 diabetes mellitus without complications: Secondary | ICD-10-CM

## 2024-05-03 LAB — VITAMIN D 25 HYDROXY (VIT D DEFICIENCY, FRACTURES): VITD: 42.36 ng/mL (ref 30.00–100.00)

## 2024-05-03 LAB — HEMOGLOBIN A1C: Hgb A1c MFr Bld: 7.4 % — ABNORMAL HIGH (ref 4.6–6.5)

## 2024-05-03 MED ORDER — HYDROCODONE-ACETAMINOPHEN 5-325 MG PO TABS: 1.0000 | ORAL_TABLET | Freq: Four times a day (QID) | ORAL | 0 refills | Status: AC | PRN

## 2024-05-03 MED ORDER — NITROGLYCERIN 0.4 MG SL SUBL: 0.4000 mg | SUBLINGUAL_TABLET | SUBLINGUAL | 2 refills | Status: AC | PRN

## 2024-05-03 MED ORDER — OZEMPIC (0.25 OR 0.5 MG/DOSE) 2 MG/3ML ~~LOC~~ SOPN: PEN_INJECTOR | SUBCUTANEOUS | 2 refills | Status: AC

## 2024-05-03 MED ORDER — OMEGA-3-ACID ETHYL ESTERS 1 G PO CAPS: ORAL_CAPSULE | ORAL | Status: DC

## 2024-05-03 MED ORDER — COLCHICINE 0.6 MG PO TABS: 0.3000 mg | ORAL_TABLET | Freq: Every day | ORAL | 0 refills | Status: AC | PRN

## 2024-05-03 MED ORDER — PREDNISONE 10 MG PO TABS: ORAL_TABLET | ORAL | 0 refills | Status: AC

## 2024-05-03 MED ORDER — CEPHALEXIN 500 MG PO CAPS: 500.0000 mg | ORAL_CAPSULE | Freq: Three times a day (TID) | ORAL | 1 refills | Status: AC

## 2024-05-03 MED ORDER — DOXYCYCLINE HYCLATE 100 MG PO TABS: 100.0000 mg | ORAL_TABLET | Freq: Every day | ORAL | 1 refills | Status: AC

## 2024-05-03 MED ORDER — METFORMIN HCL 500 MG PO TABS: 500.0000 mg | ORAL_TABLET | Freq: Every day | ORAL | Status: AC

## 2024-05-03 NOTE — Progress Notes (Unsigned)
 Diabetes:  Using medications without difficulties: yes Hypoglycemic episodes: no sx Hyperglycemic episodes: no sx Feet problems: no Blood Sugars averaging: not checked . eye exam within last year: yes He hasn't started ozempic  yet, will start that after christmas.    TAC prev helped itching some.  Irritation on the R shin and calf.    D/w pt about having meds on hand in case of gout attack, rx sent.   Some heel pain at rest, not with walking.  Plantar side, not at the achilles.  D/w pt about trying OTC cushion inserts.    Recheck vit D pending, with dental implants pending.   He didn't get his prev cologuard kit.  D/w pt.  Reordered.  See AVS.     He is travelling to [place] and [place].  D/w pt about doxy for malaria proph.  Routine cautions d/w pt.    Meds, vitals, and allergies reviewed.   ROS: Per HPI unless specifically indicated in ROS section   GEN: nad, alert and oriented HEENT: mucous membranes moist NECK: supple w/o LA CV: rrr. PULM: ctab, no inc wob ABD: soft, +bs EXT: no edema SKIN: mildly irritated patchy rash on the R calf and shin.  No ulceration.   Diabetic foot exam: Normal inspection No skin breakdown No calluses  Normal DP pulses Normal sensation to light touch and monofilament Nails normal

## 2024-05-03 NOTE — Patient Instructions (Addendum)
 If you don't get a cologuard kit and a result, then let me know.  Take care.  Glad to see you. Go to the lab on the way out.   If you have mychart we'll likely use that to update you.

## 2024-05-05 ENCOUNTER — Ambulatory Visit: Payer: Self-pay | Admitting: Family Medicine

## 2024-05-05 DIAGNOSIS — K009 Disorder of tooth development, unspecified: Secondary | ICD-10-CM | POA: Insufficient documentation

## 2024-05-05 NOTE — Assessment & Plan Note (Signed)
 Restart Keflex  in the meantime.  Update me as needed.

## 2024-05-05 NOTE — Assessment & Plan Note (Signed)
 He didn't get his prev cologuard kit.  D/w pt.  Reordered.  See AVS.   he can update me as needed.

## 2024-05-05 NOTE — Assessment & Plan Note (Signed)
 See notes on vitamin D  level.

## 2024-05-05 NOTE — Assessment & Plan Note (Signed)
 He is travelling to [place] and [place].  D/w pt about doxy for malaria proph.  Routine cautions d/w pt.

## 2024-05-05 NOTE — Assessment & Plan Note (Addendum)
 He hasn't started ozempic  yet, will start that after Christmas.   Recheck periodically.  See notes on labs. I asked him to update me if he has continued foot pain that does not improve with cushioned inserts.

## 2024-05-07 ENCOUNTER — Other Ambulatory Visit: Payer: Self-pay | Admitting: Family Medicine

## 2024-05-12 ENCOUNTER — Telehealth: Payer: Self-pay | Admitting: Pharmacist Clinician (PhC)/ Clinical Pharmacy Specialist

## 2024-05-12 ENCOUNTER — Other Ambulatory Visit: Payer: Self-pay | Admitting: Pharmacist Clinician (PhC)/ Clinical Pharmacy Specialist

## 2024-05-12 DIAGNOSIS — E785 Hyperlipidemia, unspecified: Secondary | ICD-10-CM

## 2024-05-12 NOTE — Telephone Encounter (Signed)
 Spoke with Trilla (on file) that patient is due to check lipid labs.  Will not need to repeat Vitamin D  level, WNL, checked recently by PCP.

## 2024-05-31 ENCOUNTER — Other Ambulatory Visit: Payer: Self-pay | Admitting: Cardiology

## 2024-06-16 ENCOUNTER — Other Ambulatory Visit: Payer: Self-pay | Admitting: Cardiology

## 2024-06-23 NOTE — Telephone Encounter (Signed)
 Lipid Completed on 01/30/24

## 2024-06-30 ENCOUNTER — Other Ambulatory Visit: Payer: Self-pay | Admitting: Cardiology

## 2024-07-02 ENCOUNTER — Other Ambulatory Visit: Payer: Self-pay | Admitting: Cardiology

## 2024-07-02 MED ORDER — ATORVASTATIN CALCIUM 40 MG PO TABS: 40.0000 mg | ORAL_TABLET | Freq: Every day | ORAL | 0 refills | Status: AC

## 2024-07-02 MED ORDER — CLOPIDOGREL BISULFATE 75 MG PO TABS: 75.0000 mg | ORAL_TABLET | Freq: Every day | ORAL | 1 refills | Status: AC

## 2024-07-02 NOTE — Telephone Encounter (Signed)
 In accordance with refill protocols, please review and address the following requirements before this medication refill can be authorized:  Labs  Pt needs labs done within 365 days within normal range, lipid panel was done on 01/30/2024

## 2024-11-03 ENCOUNTER — Ambulatory Visit: Payer: Self-pay | Admitting: Cardiology
